# Patient Record
Sex: Male | Born: 1980 | Race: White | Hispanic: No | Marital: Married | Smoking: Former smoker
Health system: Southern US, Community
[De-identification: ages and names within clinical notes are randomized; demographics above are authoritative.]

## PROBLEM LIST (undated history)

## (undated) DIAGNOSIS — M5020 Other cervical disc displacement, unspecified cervical region: Secondary | ICD-10-CM

## (undated) DIAGNOSIS — R7301 Impaired fasting glucose: Secondary | ICD-10-CM

## (undated) DIAGNOSIS — E785 Hyperlipidemia, unspecified: Secondary | ICD-10-CM

## (undated) DIAGNOSIS — F17200 Nicotine dependence, unspecified, uncomplicated: Secondary | ICD-10-CM

## (undated) DIAGNOSIS — R030 Elevated blood-pressure reading, without diagnosis of hypertension: Secondary | ICD-10-CM

## (undated) DIAGNOSIS — J9383 Other pneumothorax: Secondary | ICD-10-CM

## (undated) DIAGNOSIS — I10 Essential (primary) hypertension: Secondary | ICD-10-CM

## (undated) HISTORY — DX: Elevated blood-pressure reading, without diagnosis of hypertension: R03.0

## (undated) HISTORY — DX: Impaired fasting glucose: R73.01

## (undated) HISTORY — DX: Nicotine dependence, unspecified, uncomplicated: F17.200

## (undated) HISTORY — DX: Essential (primary) hypertension: I10

## (undated) HISTORY — DX: Hyperlipidemia, unspecified: E78.5

## (undated) HISTORY — PX: ULNAR NERVE DECOMPRESSION: SHX7404

## (undated) HISTORY — PX: HERNIA REPAIR: SHX51

## (undated) HISTORY — PX: LUNG SURGERY: SHX703

---

## 2003-12-06 ENCOUNTER — Observation Stay (HOSPITAL_COMMUNITY): Admission: EM | Admit: 2003-12-06 | Discharge: 2003-12-07 | Payer: Self-pay | Admitting: Emergency Medicine

## 2003-12-10 ENCOUNTER — Encounter
Admission: RE | Admit: 2003-12-10 | Discharge: 2003-12-10 | Payer: Self-pay | Admitting: Thoracic Surgery (Cardiothoracic Vascular Surgery)

## 2003-12-16 ENCOUNTER — Encounter
Admission: RE | Admit: 2003-12-16 | Discharge: 2003-12-16 | Payer: Self-pay | Admitting: Thoracic Surgery (Cardiothoracic Vascular Surgery)

## 2004-09-14 ENCOUNTER — Emergency Department (HOSPITAL_COMMUNITY): Admission: EM | Admit: 2004-09-14 | Discharge: 2004-09-14 | Payer: Self-pay | Admitting: Family Medicine

## 2005-01-18 ENCOUNTER — Inpatient Hospital Stay (HOSPITAL_COMMUNITY): Admission: EM | Admit: 2005-01-18 | Discharge: 2005-01-22 | Payer: Self-pay | Admitting: Emergency Medicine

## 2005-02-11 ENCOUNTER — Encounter
Admission: RE | Admit: 2005-02-11 | Discharge: 2005-02-11 | Payer: Self-pay | Admitting: Thoracic Surgery (Cardiothoracic Vascular Surgery)

## 2005-07-23 ENCOUNTER — Emergency Department (HOSPITAL_COMMUNITY): Admission: EM | Admit: 2005-07-23 | Discharge: 2005-07-23 | Payer: Self-pay | Admitting: Emergency Medicine

## 2005-07-26 ENCOUNTER — Encounter
Admission: RE | Admit: 2005-07-26 | Discharge: 2005-07-26 | Payer: Self-pay | Admitting: Thoracic Surgery (Cardiothoracic Vascular Surgery)

## 2005-07-30 ENCOUNTER — Encounter (INDEPENDENT_AMBULATORY_CARE_PROVIDER_SITE_OTHER): Payer: Self-pay | Admitting: *Deleted

## 2005-07-30 ENCOUNTER — Inpatient Hospital Stay (HOSPITAL_COMMUNITY)
Admission: RE | Admit: 2005-07-30 | Discharge: 2005-08-03 | Payer: Self-pay | Admitting: Thoracic Surgery (Cardiothoracic Vascular Surgery)

## 2005-08-09 ENCOUNTER — Encounter
Admission: RE | Admit: 2005-08-09 | Discharge: 2005-08-09 | Payer: Self-pay | Admitting: Thoracic Surgery (Cardiothoracic Vascular Surgery)

## 2005-09-17 ENCOUNTER — Encounter
Admission: RE | Admit: 2005-09-17 | Discharge: 2005-09-17 | Payer: Self-pay | Admitting: Thoracic Surgery (Cardiothoracic Vascular Surgery)

## 2006-09-03 ENCOUNTER — Inpatient Hospital Stay (HOSPITAL_COMMUNITY): Admission: EM | Admit: 2006-09-03 | Discharge: 2006-09-11 | Payer: Self-pay | Admitting: Emergency Medicine

## 2006-09-03 ENCOUNTER — Ambulatory Visit: Payer: Self-pay | Admitting: Thoracic Surgery (Cardiothoracic Vascular Surgery)

## 2006-09-03 ENCOUNTER — Encounter (INDEPENDENT_AMBULATORY_CARE_PROVIDER_SITE_OTHER): Payer: Self-pay | Admitting: Specialist

## 2006-09-19 ENCOUNTER — Ambulatory Visit: Payer: Self-pay | Admitting: Thoracic Surgery (Cardiothoracic Vascular Surgery)

## 2006-09-19 ENCOUNTER — Encounter
Admission: RE | Admit: 2006-09-19 | Discharge: 2006-09-19 | Payer: Self-pay | Admitting: Thoracic Surgery (Cardiothoracic Vascular Surgery)

## 2006-09-20 ENCOUNTER — Encounter (INDEPENDENT_AMBULATORY_CARE_PROVIDER_SITE_OTHER): Payer: Self-pay | Admitting: *Deleted

## 2006-09-20 ENCOUNTER — Inpatient Hospital Stay (HOSPITAL_COMMUNITY)
Admission: RE | Admit: 2006-09-20 | Discharge: 2006-09-27 | Payer: Self-pay | Admitting: Thoracic Surgery (Cardiothoracic Vascular Surgery)

## 2006-10-10 ENCOUNTER — Ambulatory Visit: Payer: Self-pay | Admitting: Thoracic Surgery (Cardiothoracic Vascular Surgery)

## 2006-10-10 ENCOUNTER — Encounter
Admission: RE | Admit: 2006-10-10 | Discharge: 2006-10-10 | Payer: Self-pay | Admitting: Thoracic Surgery (Cardiothoracic Vascular Surgery)

## 2007-01-23 ENCOUNTER — Ambulatory Visit: Payer: Self-pay | Admitting: Thoracic Surgery (Cardiothoracic Vascular Surgery)

## 2007-01-23 ENCOUNTER — Encounter
Admission: RE | Admit: 2007-01-23 | Discharge: 2007-01-23 | Payer: Self-pay | Admitting: Thoracic Surgery (Cardiothoracic Vascular Surgery)

## 2007-05-11 ENCOUNTER — Emergency Department (HOSPITAL_COMMUNITY): Admission: EM | Admit: 2007-05-11 | Discharge: 2007-05-11 | Payer: Self-pay | Admitting: Emergency Medicine

## 2007-06-01 ENCOUNTER — Emergency Department (HOSPITAL_COMMUNITY): Admission: EM | Admit: 2007-06-01 | Discharge: 2007-06-01 | Payer: Self-pay | Admitting: Emergency Medicine

## 2007-07-01 ENCOUNTER — Emergency Department (HOSPITAL_COMMUNITY): Admission: EM | Admit: 2007-07-01 | Discharge: 2007-07-01 | Payer: Self-pay | Admitting: Emergency Medicine

## 2007-08-09 ENCOUNTER — Ambulatory Visit: Payer: Self-pay | Admitting: Internal Medicine

## 2007-08-09 DIAGNOSIS — R079 Chest pain, unspecified: Secondary | ICD-10-CM

## 2007-08-09 DIAGNOSIS — J939 Pneumothorax, unspecified: Secondary | ICD-10-CM | POA: Insufficient documentation

## 2007-08-09 DIAGNOSIS — J930 Spontaneous tension pneumothorax: Secondary | ICD-10-CM | POA: Insufficient documentation

## 2007-08-15 LAB — CONVERTED CEMR LAB
ALT: 47 units/L (ref 0–53)
Albumin: 4.2 g/dL (ref 3.5–5.2)
Alkaline Phosphatase: 61 units/L (ref 39–117)
BUN: 10 mg/dL (ref 6–23)
Basophils Absolute: 0 10*3/uL (ref 0.0–0.1)
Basophils Relative: 0.1 % (ref 0.0–1.0)
CO2: 26 meq/L (ref 19–32)
Calcium: 9.2 mg/dL (ref 8.4–10.5)
Creatinine, Ser: 0.9 mg/dL (ref 0.4–1.5)
MCHC: 34.2 g/dL (ref 30.0–36.0)
Monocytes Relative: 8.9 % (ref 3.0–11.0)
Platelets: 315 10*3/uL (ref 150–400)
Potassium: 4.3 meq/L (ref 3.5–5.1)
RBC: 5.07 M/uL (ref 4.22–5.81)
RDW: 12.5 % (ref 11.5–14.6)
Total Bilirubin: 1 mg/dL (ref 0.3–1.2)
Total Protein: 6.9 g/dL (ref 6.0–8.3)

## 2007-09-25 ENCOUNTER — Ambulatory Visit: Payer: Self-pay | Admitting: Thoracic Surgery (Cardiothoracic Vascular Surgery)

## 2007-10-30 ENCOUNTER — Encounter
Admission: RE | Admit: 2007-10-30 | Discharge: 2007-11-21 | Payer: Self-pay | Admitting: Physical Medicine & Rehabilitation

## 2007-11-02 ENCOUNTER — Ambulatory Visit: Payer: Self-pay | Admitting: Physical Medicine & Rehabilitation

## 2007-11-21 ENCOUNTER — Ambulatory Visit: Payer: Self-pay | Admitting: Anesthesiology

## 2007-11-21 ENCOUNTER — Ambulatory Visit: Payer: Self-pay | Admitting: Physical Medicine & Rehabilitation

## 2008-01-08 ENCOUNTER — Emergency Department (HOSPITAL_COMMUNITY): Admission: EM | Admit: 2008-01-08 | Discharge: 2008-01-08 | Payer: Self-pay | Admitting: Family Medicine

## 2008-01-26 ENCOUNTER — Encounter
Admission: RE | Admit: 2008-01-26 | Discharge: 2008-01-26 | Payer: Self-pay | Admitting: Physical Medicine & Rehabilitation

## 2008-02-12 ENCOUNTER — Ambulatory Visit: Payer: Self-pay | Admitting: Thoracic Surgery (Cardiothoracic Vascular Surgery)

## 2008-09-28 ENCOUNTER — Emergency Department (HOSPITAL_COMMUNITY): Admission: EM | Admit: 2008-09-28 | Discharge: 2008-09-28 | Payer: Self-pay | Admitting: Emergency Medicine

## 2010-07-12 ENCOUNTER — Encounter: Payer: Self-pay | Admitting: Thoracic Surgery (Cardiothoracic Vascular Surgery)

## 2010-11-03 NOTE — Assessment & Plan Note (Signed)
OFFICE VISIT   DOMINIE, BENEDICK B  DOB:  1980-07-23                                        January 23, 2007  CHART #:  [id number]   HISTORY OF PRESENT ILLNESS:  Zeke returns for followup status post re-  do right VATS for stapling of blebs and mechanical pleurodesis for  recurrent right spontaneous pneumothorax. His most recent surgery was  September 20, 2006. He was last seen here in the office on October 10, 2006.  Initially, Samual continued to improve following the surgery and had  done fairly well. He went back to work in June and was getting along,  although he continued to have some mild residual pain on the right side.  He states that a couple of weeks ago, he started having more severe pain  again, and this seemed to become exacerbated after some strenuous  activity at work. 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 Last week, he had a particularly severe episode of pain  associated with, what sounds like muscle spasms, and he returns for  further followup to our office today. He denies any shortness of breath.  He denies any fever, chills, or productive cough. He describes pain that  typically radiates around his right anterior chest and is immediately  associated with the small surgical incisions from this previous  operations and chest tube placement. The pain seems to radiate  anteriorly and is associated with some numbness in the skin as well. The  pain is relieved by Darvocet usually, although occasionally, he has had  some episodes that sound more like muscular spasm in the shoulders. The  remainder of his review of systems is unrevealing. The remainder of his  past medical history is unchanged.   PHYSICAL EXAMINATION:  GENERAL:  Notable for a thin white male who is  healthy and appears in no acute distress.  VITAL SIGNS:  Blood pressure 129/77, pulse 78, oxygen saturation 98% on  room air.  HEENT:  Examination is  unrevealing.  CHEST:  Clear breath sounds, which are symmetrical bilaterally. There  are well healed surgical scars on both sides of the chest from multiple  previous chest tubes and video thoracoscopic surgical procedures. On the  right anterior chest on palpation, Ryosuke's pain can be reproduced by  palpating immediately anterior to the intra-lateral scars from his most  recent surgery.  CARDIOVASCULAR:  Examination is unremarkable.  ABDOMEN:  Soft, nontender.  EXTREMITIES:  Warm and well perfused.  The remainder of his physical examination is unrevealing.   DIAGNOSTIC STUDIES:  Chest x-ray obtained today at the [facility] is reviewed. This demonstrates clear lung fields  bilaterally. There is no pneumothorax. No other significant  abnormalities are noted.   IMPRESSION:  Exacerbation of post-thoracotomy pain that has been  exacerbated with increased physical activity and lifting at work. Mr.  Rubenstein still has some typical symptoms consistent with his recent video  thoracoscopic procedures and associated neuropathic pain in the  intercostal nerve distribution. He also seems to get occasional episodes  of spasm that are exacerbated with specific motions and heavy lifting.   PLAN:  I have advised Treyshon to avoid any strenuous activity or heavy  lifting, pulling, or pushing that might exacerbate these symptoms. I  have reassured him that there  is no sign of recurrent pneumothorax and  it looks as though everything has healed nicely. I have give him a  prescription for Darvocet tablets, #50 total, to use as necessary for  severe pain. I have suggested that he should use non-steroidal and anti-  inflammatory medications for less severe episodes of pain. I have also  given him a prescription for Flexeril to use for occasional severe  muscle spasms, a total of #50 tablets has been dispensed. Cullan will  call and return to see [place] here as needed in the future, should  further  problems or difficulties arise. He has been advised that if he is having  severe pain or muscle spasms bad enough to require using either Darvocet  or Flexeril, he should not be driving an automobile and he certainly  should not be operating any equipment at work. All of his questions have  been addressed.   Salvatore Decent. Cornelius Moras, M.D.  Electronically Signed   CHO/MEDQ  D:  01/23/2007  T:  01/23/2007  Job:  [id number]

## 2010-11-03 NOTE — Group Therapy Note (Signed)
This is a physical medicine/rehabilitation consultation.   REASON FOR CONSULTATION:  Post thoracotomy chest pain.   HISTORY:  A 30 year old male who has had several spontaneous  pneumothoraces.  The first one was on the left side in 2005.  He was  managed conservatively without a chest tube.  Then, he had a right  spontaneous pneumothorax chest tube placed on January 18, 2005.  Then, he  had a left spontaneous pneumothorax requiring a left thoracoscopy,  stapling of blebs, pleurodesis in February 2007.  Then, he had a right  spontaneous pneumothorax, status post right thoracoscopy, VATS procedure  bleb resection, pleurodesis.  Scope was placed the 7th intercostal space  and then, September 20, 2006, VATS procedure placed through the 4th  intercostal space.  He has had some pain after each episode, after a  chest tube or thoracoscopy which slowly resolved over time.  This  current episode has been ongoing since March 2008.  He feels that it is  getting somewhat better at times.  The pain is intermittent and  difficult to predict, really not activity-related because his increased  pain might come on a couple of days after he completes something like  yard work.   His current pain is a 2/10 but when it gets bad it gets to an 8.  Described as sharp and stabbing.  He gets numbness around that area.  He  has no lower extremity weakness or numbness.  He has occasional back  spasms but really no significant back pain.  He has had no traumatic  history in terms of falls or motor vehicle accident.  His pain  interference score generally is at a 2/10 level but with relationship  with other people a 6/10.  Intercourse seems to make things worse.  His  pain is worse during the evening and nighttime hours.  Sleep is fair.  Pain improves with rest, ice, and his medications.   He has tried several medications.  His primary physician started him on  Lidoderm patch which has been helpful.  He has had some  success with  Neurontin started by primary care and increased by his cardiothoracic  surgeon.  His limiting factor had been drowsiness with the Neurontin but  he had gotten used to the 300 mg b.i.d. at this point.  It just took him  a little while to get used to it.  He has had some intermittent Darvocet  which is somewhat helpful.  Motrin does not particularly help.  He has  tried some Flexeril at night.  He has also through the ED received some  hydrocodone but not currently taking.  He has not tried any Tramadol.   PAST MEDICAL HISTORY:  Unremarkable other than the above.   CURRENT FUNCTIONAL STATUS:  Independent.  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   CURRENT MEDICATIONS:  1. Lidoderm patch on every morning and off every evening.  2. Darvocet-N 100, he takes these more on an occasional basis.  3. Gabapentin 300 mg b.i.d.   ALLERGIES:  None known.   SOCIAL HISTORY:  Single, lives with his fiance, no alcohol or drug  abuse.   FAMILY HISTORY:  Unremarkable.   VITAL SIGNS:  Blood pressure 118/69, pulse 74, respiratory rate 18, O2  sat 98% on room air.   PHYSICAL EXAMINATION:  GENERAL:  No acute distress.  BACK:  No tenderness on palpation  along the lumbar paraspinals.  MUSCULOSKELETAL:  He has incisions from prior chest tube or thoracoscopy  incisions bilateral parascapular areas and these are nontender.  He had  an area at the posterior axillary line on the right side in the lateral  rib area and this mildly painful to palpation.  LUNGS:  Clear, equal breath sounds.  HEART:  Regular rate and rhythm.  ABDOMEN:  Positive bowel sounds.  Soft, nontender.  CHEST:  He does have tenderness over the anterior ribs corresponding  with the T7 rib.  He does have some pain right at the junction of the  lower sternum.  He has some mild tenderness at the costal cartilage  below as well.  NEUROLOGIC:  Motor strength  is full bilateral upper and lower  extremities.  Range of motion is full bilateral upper and lower  extremities.  Deep tendon reflexes are normal bilateral upper and lower  extremities.  Sensation is normal over the trunk and the extremities.   IMPRESSION:  1. Post thoracotomy syndrome.  2. Intercostal neuralgia.   RECOMMENDATIONS:  1. I agree with current medications.  I would slowly increase his      Neurontin first to t.i.d. starting over a weekend when he does not      have to work and then after a week going to q.i.d.  2. I recommend intercostal nerve block.  We can do this under      fluoroscopic guidance.  We will target T6-T7 ribs.  3. Should the Neurontin not be tolerated in higher dosages, may      consider Lyrica versus Topamax.  Would also consider night-time      usage of Flexor patch.  4. Consider a TENS unit with some desensitization training through      physical therapy.   Thank you for this interesting consultation.  I will keep you apprised  of the situation.      Erick Colace, M.D.  Electronically Signed     AEK/MedQ  D:  11/02/2007 15:49:36  T:  11/02/2007 16:53:18  Job #:  [id number]   cc:   Lavonda Jumbo, M.D.  Fax: [phone]

## 2010-11-03 NOTE — Assessment & Plan Note (Signed)
OFFICE VISIT   TAVEN, STRITE B  DOB:  [birth year]                                        February 12, 2008  CHART #:  [id number]   HISTORY OF PRESENT ILLNESS:  The patient returns to the office today for  followup related to post-thoracotomy pain.  He was last seen here in the  office on September 25, 2007.  Since then, he has been followed through the  [facility], and he is doing much better now on Neurontin 400 mg 4 times  daily.  He is back to work with normal physical activity and overall, he  is getting along quite well.  He states that he will occasionally have a  day where he has a little more problem with pain and he may occasionally  take a Darvocet or use a Lidoderm patch to control this.  However, for  the most part, he is not requiring any medication other than Neurontin  and he is getting along quite well with this.  Apparently, he did have  an injection done at one point of the intercostal nerve block and  enjoyed 2 weeks of substantial improvement.  However, after 2 weeks, the  pain recurred.  Overall, he is getting along well and he has no  complaints.   The remainder of his past medical history is unchanged.   Physical exam is unchanged from previously.   IMPRESSION:  Overall, the patient is doing much better on long-term  Neurontin for post-thoracotomy pain.   PLAN:  The patient will continue to follow up in the Pain Clinic.  He  will call or return to see [place] as needed in the future.   Salvatore Decent. Cornelius Moras, M.D.  Electronically Signed   CHO/MEDQ  D:  02/12/2008  T:  02/13/2008  Job:  [id number]

## 2010-11-03 NOTE — Assessment & Plan Note (Signed)
OFFICE VISIT   ZENO, HICKEL B  DOB:  10/18/1980                                        September 25, 2007  CHART #:  [id number]   HISTORY OF PRESENT ILLNESS:  Mr. Charnley returns for followup status post  bilateral VATS for apical stapling of blebs with mechanical pleurodesis  for recurrent spontaneous pneumothoraces. His most recent surgery was  redo right VATS on September 20, 2006. He was last seen here in the office on  January 23, 2007. Wolf had problems with postoperative pain management  following all of his previous surgical procedures. This seemed to be  most pronounced after this most recent surgery, which was a redo  operation on the right side. His pain had gradually subsided, and he was  back at work. But apparently in December the pain got worse again and  has become more troublesome. He has been followed by his primary care  physician, Dr. Devoria Albe. He has been tried on Lidoderm with some  success as well as low-dose Neurontin and ibuprofen. He apparently was  seen by Dr. Sandrea Hughs and underwent a chest CT scan in February 2009.  This revealed no acute findings whatsoever and was notable only for some  mild residual scarring in both upper lobes related to the patient's  previous surgery. Mr. Bogdanski has now been referred back to our office  for evaluation of continued pain.   REVIEW OF SYSTEMS:  Levan notes that his pain is always in the same  location, and it is located along the costochondral margin involving the  anterior aspect of the right 4th, 5th, and 6th ribs and their costal  cartilages. He reports problems with swelling in this area that is  associated with increased numbness and hyperesthesia and pain. He has  been told by others that he has had costochondritis as well as possible  gas in this stomach. He denies any fevers, chills, or productive cough.  At times, the pain is exacerbated by some physical activities,  particularly when  he is lifting things at work. The remainder of his  review of systems is unremarkable. The remainder of his past medical  history is unchanged.   PHYSICAL EXAMINATION:  GENERAL:  Well-appearing, thin, young male.  VITAL SIGNS:  Blood pressure 139/84, pulse 88, oxygen saturation 96% on  room air.  HEENT:  Unrevealing.  NECK:  Supple. There is no lymphadenopathy.  CHEST:  Auscultation of the chest reveals clear breath sounds which are  symmetrical bilaterally. All of the small surgical incisions from chest  tube placements and video-assisted thoracoscopic surgery have healed  completely. There are no bony abnormalities that are palpable on the  thoracic rib cage. There is no swelling at all appreciated anteriorly  where Carla reports feeling sensation of swelling.  ABDOMEN:  Soft, nontender. No other abnormalities are noted.   IMPRESSION:  Likely post-thoracotomy pain syndrome with continued  neuropathic pain in the nerve root distribution associated with  intercostal nerves, particularly in the right 5th intercostal space.   PLAN:  I have advised Izrael to resume Lidoderm patch, ibuprofen, and  try an increased dose of Neurontin. He is concerned that the Neurontin  may make him drowsy, so for the time being I have suggested trying 600  mg at bedtime. This could be given during the daytime  if it does not  make him drowsy or if his pain is debilitating to the point where he is  not going to work. It does not seem to be quite that bad at this point.  I have advised him that he might benefit from a referral to the pain and  rehab center here in [facility] for further evaluation and management of  this particularly difficult case of post-thoracotomy neuropathic pain.  All of his questions have been addressed. He will return in 3 months for  further followup.   Salvatore Decent. Cornelius Moras, M.D.  Electronically Signed   CHO/MEDQ  D:  09/25/2007  T:  09/25/2007  Job:  [id number]   cc:   Devoria Albe,  M.D.  Charlaine Dalton. Sherene Sires, MD, FCCP

## 2010-11-06 NOTE — Op Note (Signed)
Cristian Walker, Cristian Walker              ACCOUNT NO.:  [id number]   MEDICAL RECORD NO.:  [id number]          PATIENT TYPE:  INP   LOCATION:  2550                         FACILITY:  [facility]   PHYSICIAN:  Salvatore Decent. Cornelius Moras, M.D. DATE OF BIRTH:  Aug 02, 1980   DATE OF PROCEDURE:  07/30/2005  DATE OF DISCHARGE:                                 OPERATIVE REPORT   PREOPERATIVE DIAGNOSIS:  Recurrent left spontaneous pneumothorax.   POSTOPERATIVE DIAGNOSIS:  Recurrent left spontaneous pneumothorax.   PROCEDURE:  Left video video-assisted thoracoscopy for stapling of apical  blebs and mechanical pleural adhesions.   SURGEON:  Salvatore Decent. Cornelius Moras, M.D.   ASSISTANT:  Jerold Coombe, P.A.   ANESTHESIA:  General.   BRIEF CLINICAL NOTE:  The patient is a 30 year old male with previous  history of spontaneous pneumothorax.  He has sustained a left spontaneous  pneumothorax on two previous occasions and right spontaneous pneumothorax on  one.  Recently he returned with a recurrent left spontaneous pneumothorax.  The indications and potential benefits of video-assisted thoracoscopy for  apical stapling of blebs and pleurodesis has been discussed at length with  the patient.  Alternative treatment strategies have been discussed.  He  understands and accepts all associated risks of surgery including but not  limited to risks of death, myocardial infarction, pneumonia, respiratory  failure, prolonged air leak requiring chest tube drainage, prolonged chest  wall pain, recurrent spontaneous pneumothorax.  All of his questions have  been addressed.   OPERATIVE NOTE IN DETAIL:  The patient is brought to the operating room and  above-mentioned date and placed in the supine position on the operating  table.  A central venous catheter and radial arterial line are placed.  Intravenous antibiotics are administered. Pneumatic sequential compression  boots are placed on both lower extremities.  General endotracheal  anesthesia  is induced uneventfully under the care and direction of Dr. Sharee Holster  with a dual-lumen endotracheal tube.  A Foley catheter is placed.  After  verifying appropriate position of the dual-lumen endotracheal tube, the  patient is turned to the right lateral decubitus position using an axillary  roll and pneumatic beanbag device to facilitate positioning.  The patient's  left chest is prepared and draped in a sterile manner.   Single-lung ventilation is begun.  A small incision is made overlying the  anterior axillary line at approximately the sixth intercostal space.  The  intercostal musculature and subcutaneous tissues are divided with  electrocautery.  The left pleural space is entered bluntly.  There do not  appear to be any adhesions.  A 10 mm port is passed through the incision and  a 30-degree endoscopic video scope is passed through the port.  The left  chest is explored visually.  The lung is completely collapsed without any  adhesions.  Two additional port incisions are placed including one located  posteriorly overlying the fifth intercostal space and another located  anteriorly over the fourth intercostal space.  Instruments are passed  through each of these two port incisions to facilitate subsequent surgical  procedure.  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  Wedge resection of the apical blebs from the apex of the left upper lobe is  performed using several fires of the Echelon endoscopic stapling device with  3.5 mm staple load.  Another bleb is located laterally in the left upper  lobe, and this is resected as a separate small wedge specimen.  Each of  these two specimens are sent to pathology for permanent histology.  The  remainder of the left upper lobe is inspected and no other significant blebs  are identified.  The superior segment of left lower lobe is  inspected, and  there do not appear to be any significant blebs in this region.  Apical  pleurectomy is now performed.  The parietal pleura is removed from the  majority of the apex of the entire left hemithorax.  A cautery scratchpad is  then utilized to abrade the remainder of the parietal pleural laterally,  posteriorly, as well as along the dome of the diaphragm.  Adequate  hemostasis is achieved.   The left pleural space is drained using a single 28-French right-angled  chest tube exited through the seventh intercostal port incision.  The On-Q  continuous pain management system is utilized to assist postoperative pain  control.  A single five-inch Silastic catheter from the On-Q kit is tunneled  through the subcutaneous tissues and then subsequently tunneled into the  intercostal musculature between the fifth and sixth rib.  No attempt is made  to place the catheter posteriorly vertically as the parietal pleura has been  removed in this region.  The catheter is flushed with 5 mL of 0.5%  bupivacaine and ultimately connected to a continuous infusion pump.  The  lung is now ventilated with the endoscopic camera through the anterior port  incision.  There is no obvious sign of any significant air leaks and the  lung re-expanded nicely.  The camera and port are removed.  The two  remaining port incisions are closed in multiple layers and dry sterile  dressings are applied.  The chest tube is fixed to a closed-suction drainage  device.   The patient tolerated the procedure well and is extubated in the operating  room, transported to the recovery room in stable condition.  There are no  intraoperative complications.  All sponge, instrument and needle counts are  verified correct.  Salvatore Decent. Cornelius Moras, M.D.  Electronically Signed     CHO/MEDQ  D:  07/30/2005  T:  07/30/2005  Job:  [id number]

## 2010-11-06 NOTE — H&P (Signed)
NAMESAMAR, DASS              ACCOUNT NO.:  [id number]   MEDICAL RECORD NO.:  [id number]          PATIENT TYPE:  INP   LOCATION:  1824                         FACILITY:  [facility]   PHYSICIAN:  Salvatore Decent. Cornelius Moras, M.D. DATE OF BIRTH:  01-16-1981   DATE OF ADMISSION:  01/18/2005  DATE OF DISCHARGE:                                HISTORY & PHYSICAL   PRESENTING CHIEF COMPLAINT:  Right-sided chest pain.   HISTORY OF PRESENT ILLNESS:  Patient is a 30 year old previously healthy  male with history of small left spontaneous pneumothorax in June2005 treated  conservatively.  This morning he developed sudden onset of right-sided chest  pain associated with shortness of breath.  This pain was similar to symptoms  he suffered one year ago, but much more severe.  He presented to the  emergency room where chest x-ray demonstrates large right pneumothorax.   REVIEW OF SYSTEMS:  The patient reports feeling well otherwise recently.  He  denied productive cough or shortness of breath.  He denies recent fevers or  chills.  He denies any recent history of trauma.  The remainder of his  review of systems is noncontributory.   PAST MEDICAL HISTORY:  1.  Small left spontaneous pneumothorax June2005 treated conservatively      without need for chest tube placement  2.  Episode of tracheobronchitis in [id number].   PAST SURGICAL HISTORY:  None.   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  This  does require some heavy lifting and strenuous activity at times.  The  patient is a smoker.  He denies excessive alcohol consumption.   MEDICATIONS PRIOR TO ADMISSION:  None.   ALLERGIES:  None known.   PHYSICAL EXAMINATION:  GENERAL:  Patient is a thin, well-appearing white  male who appears his stated age.  The patient is short of breath, but  otherwise in no distress.  He is in sinus rhythm.  VITAL  SIGNS:  Oxygen saturation is greater than 90% on 2 L nasal cannula.  HEENT:  Grossly unrevealing.  The trachea is midline.  NECK:  Supple.  There is no cervical or supraclavicular lymphadenopathy.  LUNGS:  Auscultation of the chest reveals diminished breath sounds in the  right lung field.  No wheezes or rhonchi are noted.  CARDIOVASCULAR:  Regular rate and rhythm.  No murmurs or rubs are noted.  ABDOMEN:  Soft, nontender.  EXTREMITIES:  Warm and well-perfused.  There is no lower extremity edema.  Pulses are intact bilaterally.  The remainder of his physical examination is  noncontributory.   DIAGNOSTIC TESTS:  Chest x-ray demonstrates large right pneumothorax.   IMPRESSION:  Right spontaneous pneumothorax.   PLAN:  Chest tube placement followed by hospital admission.       CHO/MEDQ  D:  01/18/2005  T:  01/18/2005  Job:  [id number]

## 2010-11-06 NOTE — H&P (Signed)
NAMEJEDRICK, HUTCHERSON              ACCOUNT NO.:  [id number]   MEDICAL RECORD NO.:  [id number]          PATIENT TYPE:  EMS   LOCATION:  MAJO                         FACILITY:  [facility]   PHYSICIAN:  Salvatore Decent. Cornelius Moras, M.D. DATE OF BIRTH:  04-Oct-1980   DATE OF ADMISSION:  09/03/2006  DATE OF DISCHARGE:                              HISTORY & PHYSICAL   CHIEF COMPLAINT:  Right side chest pain.   HISTORY OF PRESENT ILLNESS:  Mr. Cristian Walker is a 30 year old white male with  history of multiple recurrent spontaneous pneumothoraces.  He sustained  his first spontaneous pneumothorax on the left side in June 2005.  In  July 2006 he sustained a large right spontaneous pneumothorax which was  treated with chest tube placement.  In February 2007 the patient  developed a recurrent left spontaneous pneumothorax and underwent left  VATS for apical bleb resection and pleurodesis on July 30, 2005.  He  recovered from this uneventfully.  He quit smoking.  He was in his usual  state of health until this morning when he developed sudden onset of  right-sided chest pain and mild shortness of breath.  He presented to  the emergency room where a large right pneumothorax was identified on  routine chest x-ray.   REVIEW OF SYSTEMS:  Unremarkable.  The patient has been in his usual  state of health.  He denies any recent problems with shortness of breath  or cough.  He has not been smoking.  He does have some history of  anxiety and he reports that he has actually been dreaming about the idea  that he might need surgery on the right side.  The remainder of his  review of systems is entirely unremarkable.   PAST MEDICAL HISTORY:  1. Left spontaneous pneumothorax, June 2005.  2. Right spontaneous pneumothorax, July 2006.  3. Recurrent left spontaneous pneumothorax, February 2007.  4. Tracheobronchitis, March 2006.  5. Remote history of tobacco use.   PAST SURGICAL HISTORY:  1. Right chest tube placement, July  2006.  2. Left VATS for apical bleb resection and pleurodesis, February 2007.   FAMILY HISTORY:  Noncontributory.   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  This does require some heavy lifting and strenuous physical activity.  The patient quit smoking.  He denies excessive alcohol consumption.   MEDICATIONS PRIOR TO ADMISSION:  None.   DRUG ALLERGIES:  None known.   PHYSICAL EXAMINATION:  The patient is a thin white male who appears his  stated age in no acute distress.  He is breathing comfortably on 2  liters nasal cannula.  HEENT: Exam is unrevealing.  NECK:  The neck is supple.  The trachea is midline.  Auscultation of the chest demonstrates diminished breath sounds on the  right side.  No wheezes or rhonchi are noted.  CARDIOVASCULAR:  Exam  includes regular rate and rhythm.  No murmurs, rubs, or gallops are  noted.  ABDOMEN:  Soft, nontender.  EXTREMITIES:  Warm and well-perfused.  The remainder of his physical  exam is noncontributory.   DIAGNOSTIC TESTS:  CHEST X-RAY:  Obtained in the emergency room  demonstrates large right pneumothorax.   IMPRESSION:  Recurrent right spontaneous pneumothorax.   PLAN:  I have discussed options at length with Mr. Buckles here in the  emergency room.  He is well aware of the circumstances given his  previous history.  He desires to proceed directly to surgical  intervention for right VATS with apical bleb resection and pleurodesis.  He understands and accepts all associated risks of surgery including but  not limited to risks of bleeding, prolonged pain, prolonged air leak  requiring chest tube drainage, recurrent pneumothorax, pneumonia.  All  of his questions have been addressed.      Salvatore Decent. Cornelius Moras, M.D.  Electronically Signed     CHO/MEDQ  D:  09/03/2006  T:  09/04/2006  Job:  [id number]

## 2010-11-06 NOTE — H&P (Signed)
Cristian Walker, Cristian Walker              ACCOUNT NO.:  [id number]   MEDICAL RECORD NO.:  [id number]           PATIENT TYPE:   LOCATION:                                 FACILITY:   PHYSICIAN:  Salvatore Decent. Cornelius Moras, M.D.      DATE OF BIRTH:   DATE OF ADMISSION:  09/20/2006  DATE OF DISCHARGE:                              HISTORY & PHYSICAL   PRESENTING CHIEF COMPLAINT:  Right-sided chest pain and shortness of  breath.   HISTORY OF PRESENT ILLNESS:  Cristian Walker is a 30 year old male who  recently was admitted on March15,2008, for recurrent right  spontaneous pneumothorax.  He was taken to the operating room that day  where he underwent right VATS for stapling of blebs and mechanical  pleurodesis.  He initially did well.  However, after his chest tube was  removed several days later, his right lung partially collapsed, and a  second chest tube had to be replaced.  After several further days of  observation, this chest tube was ultimately removed.  He was discharged  home just over a week ago.  He returns for the office today complaining  of increased right-sided pain and shortness of breath.  Chest x-ray  demonstrates interval increase in size of right-sided pneumothorax, now  approximately 25%.   REVIEW OF SYSTEMS:  Unremarkable.  The patient is otherwise well and  healthy.   PAST MEDICAL HISTORY:  1. Left spontaneous pneumothorax June2005.  2. Right spontaneous pneumothorax July2006.  3. Recurrent left spontaneous pneumothorax February2007.  4. Tracheobronchitis [id number].  5. Remote history tobacco use.  6. Recurrent right spontaneous pneumothorax September 03, 2006  7. Recurrent right spontaneous pneumothorax September 06, 2006   PAST SURGICAL HISTORY:  1. Right chest tube placement [id number].  2. Left VATS for apical bleb resection and pleurodesis      February2007.  3. Right VATS for apical bleb resection and pleurodesis      March15,2008.  4. Right chest tube placement September 06, 2006.   FAMILY HISTORY:  Noncontributory.   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  He  quit smoking.   MEDICATIONS PRIOR TO ADMISSION:  None.   DRUG ALLERGIES:  None known.   PHYSICAL EXAMINATION:  GENERAL:  The patient is a thin white male in no  acute distress.  VITAL SIGNS: Oxygen saturation 99% on room air, respirations 18 and  unlabored, blood pressure 110/70, pulse 77.  HEENT: Exam is within normal limits.  CHEST:  Auscultation of the chest reveals slightly diminished breath  sounds on the right side.  All the previous surgical incisions are  healing nicely.  CARDIOVASCULAR:  Exam is notable for regular rate and rhythm.  No  murmurs, rubs or gallops are noted.  ABDOMEN:  Soft, nontender.  EXTREMITIES:  Warm and well-perfused.   The remainder of his physical exam is noncontributory.   DIAGNOSTIC TESTS:  Chest x-ray obtained today is reviewed and compared  with the last previous exam for March23.  This demonstrates  increased  size of right apical pneumothorax, now approximately 25%.  No other  abnormalities are noted.   IMPRESSION:  Second time recurrent partial collapse of the right lung  following recent right video-assisted thoracoscopy with apical bleb  resection and pleurodesis.   PLAN:  I have discussed options at length with Cristian Walker including  continued observation, repeat chest tube placement, or proceeding  directly to surgery for redo VATS and possible thoracotomy.  I suspect  that surgical intervention would be the best approach given the fact  that this is the second time Cristian Walker's lung has partially collapsed  following his recent surgery.  I have discussed the indications, risks,  and potential benefits of surgery with Cristian Walker.  All of his questions  have been addressed.  We tentatively plan to proceed with surgery first  thing tomorrow morning.   During the interval period of time, Cristian Walker  will have someone bring him to the emergency department should he  develop any increased shortness of breath.      Salvatore Decent. Cornelius Moras, M.D.  Electronically Signed     CHO/MEDQ  D:  09/19/2006  T:  09/19/2006  Job:  [id number]

## 2010-11-06 NOTE — Op Note (Signed)
Cristian Walker, Cristian Walker              ACCOUNT NO.:  [id number]   MEDICAL RECORD NO.:  [id number]          PATIENT TYPE:  INP   LOCATION:  1824                         FACILITY:  [facility]   PHYSICIAN:  Salvatore Decent. Cornelius Moras, M.D. DATE OF BIRTH:  06/30/1980   DATE OF PROCEDURE:  01/18/2005  DATE OF DISCHARGE:                                 OPERATIVE REPORT   PREOPERATIVE DIAGNOSIS:  Right spontaneous pneumothorax.   POSTOPERATIVE DIAGNOSIS:  Right spontaneous pneumothorax.   PROCEDURE:  Right chest tube placement.   SURGEON:  Salvatore Decent. Cornelius Moras, M.D.   ANESTHESIA:  Lidocaine 1% with intravenous sedation.   OPERATIVE NOTE:  Following full informed consent with the patient in the  emergency department, light intravenous sedation was administered. The  patient's right chest was prepared with Betadine solution and draped in a  sterile manner. Lidocaine solution 1% was utilized to locally anesthetize  the skin and subcutaneous tissues overlying the fifth intercostal space in  approximately the mid axillary line. A small incision is made with a #11  blade knife. Through this incision, additional lidocaine solution is  administered to facilitate intercostal nerve block. A 28-French chest tube  was passed through the incision and directed into the right pleural space.  The chest tube is secured to the skin with silk sutures and connected to a  closed suction drainage device. The patient tolerated the procedure well.  There were no complications. Follow-up chest x-ray remains pending.       CHO/MEDQ  D:  01/18/2005  T:  01/18/2005  Job:  [id number]

## 2010-11-06 NOTE — Op Note (Signed)
Cristian Walker, Cristian Walker              ACCOUNT NO.:  [id number]   MEDICAL RECORD NO.:  [id number]          PATIENT TYPE:  INP   LOCATION:  1825                         FACILITY:  [facility]   PHYSICIAN:  Salvatore Decent. Cornelius Moras, M.D. DATE OF BIRTH:  [birth year]   DATE OF PROCEDURE:  09/03/2006  DATE OF DISCHARGE:                               OPERATIVE REPORT   PREOPERATIVE DIAGNOSIS:  Recurrent right spontaneous pneumothorax.   POSTOPERATIVE DIAGNOSIS:  Recurrent right spontaneous pneumothorax.   PROCEDURE:  Right video assisted thoracoscopy for apical bleb resection  and mechanical pleurodesis.   SURGEON:  Salvatore Decent. Cornelius Moras, M.D.   ASSISTANT:  Zadie Rhine, P.A.-C.   ANESTHESIA:  General.   BRIEF CLINICAL NOTE:  The patient is a 30 year old male with previous  history of right spontaneous pneumothorax in 2006 treated with chest  tube placement.  The patient has also undergone previous left video  assisted thoracoscopy for bleb resection and pleurodesis for recurrent  left spontaneous pneumothorax in the past.  The patient now presents  with a recurrent right spontaneous pneumothorax.  After full informed  consent, the patient is brought directly to the operating room for  definitive surgical management.  The patient understands and accepts all  associated risks of surgery and desires to proceed as described.   OPERATIVE NOTE IN DETAIL:  The patient is brought directly from the  emergency room to the operating room on the afternoon of September 03, 2006.  Central venous catheter is placed for intravenous access.  Intravenous  antibiotics were administered.  The patient is placed in the supine  position on the operating table.  Pneumatic sequential compression boots  were placed on both lower extremities.  General endotracheal anesthesia  is induced uneventfully and a dual lumen endotracheal tube is placed.  The patient is turned to the left lateral decubitus position using a  pneumatic  beanbag device and axillary roll to facilitate positioning.  The patient's right chest was prepared and draped in a sterile manner.   A small incision is made over the anterior axillary line in  approximately the seventh intercostal space.  The intercostal  musculature and subcutaneous tissues were divided with electrocautery.  The right pleural space was entered bluntly.  A 10 mm port is passed  through the incision and an endoscopic camera is passed through the  port.  A routine video exploration is performed.  The lung was  completely collapsed.  There are no adhesions.  There is some obvious  blebs at the apex of the right lung.  Two additional port incisions were  placed including one anteriorly and one posteriorly.  Using these port  incisions, the lung was grasped and elevated and the apex of the right  lung was visualized.  There are numerous blebs in the apex of the right  upper lung.  A generous wedge resection of the apex of the right lung is  performed using several fires of the echelon endoscopic GIA stapling  device.  The apex was sent for routine pathology.  Further exploration  is performed.  No blebs are noted in the superior  segment of the right  lower lobe.  The remainder of the lung appears fairly normal.  No other  abnormalities are noted.  The parietal pleura is stripped from the  anterior lateral and apical wall of the chest.  Meticulous hemostasis is  ascertained.  The On-Q continuous pain management system is utilized to  facilitate postoperative pain control.  A 5 inch catheters supplied with  the On-Q kit is tunneled through a separate stab incision posteriorly  and positioned in the posterior gutter.  This is positioned in the  subpleural space just below the level of the pleurectomy.  The catheter  is flushed with 5 mL of 0.5% bupivacaine solution and ultimately  connected to continuous infusion pump.  The right chest is drained with  a single 28-French chest  tube.  The remaining incisions were closed in  multiple layers with running absorbable suture.  The patient tolerated  the procedure well, is extubated in the operating room, and transported  to the recovery room in stable condition.  There are no intraoperative  complications.  All sponge, instrument and needle counts were verified  as correct at the completion of the procedure.      Salvatore Decent. Cornelius Moras, M.D.  Electronically Signed     CHO/MEDQ  D:  09/03/2006  T:  09/04/2006  Job:  [id number]

## 2010-11-06 NOTE — Discharge Summary (Signed)
NAMEELIZAR, Cristian Walker              ACCOUNT NO.:  [id number]   MEDICAL RECORD NO.:  [id number]          PATIENT TYPE:  INP   LOCATION:  2034                         FACILITY:  [facility]   PHYSICIAN:  Salvatore Decent. Cornelius Moras, M.D. DATE OF BIRTH:  09-05-1980   DATE OF ADMISSION:  09/20/2006  DATE OF DISCHARGE:                               DISCHARGE SUMMARY   ADMISSION DIAGNOSIS:  Recurrent right spontaneous pneumothorax.   DISCHARGE/SECONDARY DIAGNOSES:  1. Recurrent right spontaneous pneumothorax status post redo right      video assisted thoracoscopy for apical stapling of blebs of      mechanical pleurodesis.  2. History of left spontaneous pneumothorax in June of 2005.  Right      spontaneous pneumothorax in July of 2006.  Recurrent left      spontaneous pneumothorax in February of 2007.  Recurrent right      spontaneous pneumothorax on September 03, 2006.  Recurrent right      spontaneous pneumothorax on September 06, 2006.  3. Tracheobronchitis in March of 2006.  4. Remote history of tobacco use.  5. Right chest tube placement in July of 2006.  6. Left video assisted thoracoscopy for apical bleb resection and      pleurodesis in February of 2007.  7. Right video assisted thoracoscopy for apical bleb resection and      pleurodesis in March of 2008.  8. Right chest tube placement on September 06, 2006.   PROCEDURES:  Redo right video assisted thoracoscopy for apical stapling  and blebs and mechanical pleurodesis.  Surgeon - Dr. Tressie Stalker.   BRIEF HISTORY:  Cristian Walker is a 30 year old male who was recently  admitted on September 03, 2006 for recurrent right spontaneous pneumothorax.  He was taken to the operating room that day where he underwent right  VATS for stapling of blebs and mechanical pleurodesis.  He initially did  well.  However, after his chest tube was removed, several days later his  right lung partially collapsed and second chest tube had to be replaced.  After several days of  observation, his chest tube was ultimately  removed, and he was discharged home.  On September 20, 2006, he returned to  the office for follow-up complaining of increased right-sided pain and  shortness of breath.  Chest x-ray demonstrated interval increase in size  of the right-sided pneumothorax now at approximately 25%.  Dr. Cornelius Moras  discussed several options with Cristian Walker including continued  observation, repeat chest tube placement, or proceeding directly to  surgery for a redo VATS and possible thoracotomy.  Since this was the  second time of recurrence, surgical intervention was felt to be the best  approach.  The patient agreed to proceed.   HOSPITAL COURSE:  Cristian Walker was admitted to [facility] on  September 20, 2006 for recurrent spontaneous right pneumothorax.  He was  taken to the operating room for bleb stapling and mechanical  pleurodesis.  Postoperatively, he was extubated, neurologically intact  and transferred to step-down unit 3300 where he remained for the  majority of his hospitalization.  Pain was  initially controlled with  Dilaudid PCA.  For the first third 3 days postoperatively, the patient  had a small air leak with a chest x-ray showing 10 to 15% right  pneumothorax which was managed with suction.  The air leak seemed to  decrease over time, and his posterior chest tube was ultimately  discontinued on postoperative day #3, and his remaining tube was placed  to water seal.  Following a few days of water seal, his chest x-ray  remained stable showing only approximately 5% apical pneumothorax on the  right.  By September 26, 2006, his air leak had resolved.  Subsequently, his  chest tube was discontinued.  His follow-up chest x-ray showed possible  recurrence of his right pneumothorax, but there was also a question if  the appearance was actually due to pleural reaction.  The chest x-ray  was felt stable by Dr. Cornelius Moras, and no further tests were ordered at the  time other  than a follow-up chest x-ray, PA and lateral, the next  morning.  If his chest x-ray remains stable, then we anticipate him  being discharged home on postoperative day #7, September 27, 2006.   Following removal of his chest tube, his Dilaudid PCA was discontinued.  Afterwards, his pain was managed on OxyContin 10 mg SR p.r.n.  He did  not like how the Percocet made him feel, so he was changed to Darvocet  p.r.n., as well.  Throughout his hospitalization, Cristian Walker remained  stable.  Oxygen saturations were above 92% on room air.  The most recent  labs showed a white blood count of 9.2, hemoglobin 12.2, hematocrit  34.8, platelet count 312.  Sodium 138, potassium 3.6, chloride 103, CO2  of 31, BUN 6, creatinine 0.67, blood glucose 91.   PHYSICAL EXAMINATION:  HEART:  Regular rate and rhythm.  LUNGS:  Lung sounds were clear.  SKIN:  Incisions were healing well without signs of infection.   DISPOSITION:  If Cristian Walker's follow-up chest x-ray on September 27, 2006 is  stable, we anticipate he will be discharged home in stable condition.   DISCHARGE MEDICATIONS:  1. OxyContin 10 mg SR one tablet p.o. q.12h. p.r.n. pain.  2. Darvocet-N 100, 1-2 tablets p.o. q.4-6h. p.r.n. pain.   DISCHARGE INSTRUCTIONS:  He can follow a regular diet.  Increase his  activity slowly and avoid driving or heavy lifting for the next week.  He may shower and clean his incisions gently with soap and water and  should call if he develops fever greater than 101, redness or drainage  from his incision sites or increased shortness of breath.  He will  follow up with Dr. Cornelius Moras in approximately 7-10 days with a chest x-ray 1  hour before at [facility].      Jerold Coombe, P.A.      Salvatore Decent. Cornelius Moras, M.D.  Electronically Signed    AWZ/MEDQ  D:  09/26/2006  T:  09/26/2006  Job:  [id number]   cc:   Dr. Lynelle Doctor

## 2010-11-06 NOTE — Discharge Summary (Signed)
NAMEDORSIE, BURICH              ACCOUNT NO.:  [id number]   MEDICAL RECORD NO.:  [id number]          PATIENT TYPE:  INP   LOCATION:  2037                         FACILITY:  [facility]   PHYSICIAN:  Pecola Leisure, PA   DATE OF BIRTH:  11-04-1980   DATE OF ADMISSION:  01/18/2005  DATE OF DISCHARGE:                                 DISCHARGE SUMMARY   ADMISSION DIAGNOSIS:  Right-sided chest pain.   PAST MEDICAL HISTORY AND DISCHARGE DIAGNOSES:  1.  Small left spontaneous pneumothorax, June 2005, treated conservatively      without need for chest tube placement.  2.  Episode of tracheobronchitis in March 2006.  3.  Right spontaneous pneumothorax treated with chest tube and resolved.   ALLERGIES:  No known drug allergies.   BRIEF HISTORY:  The patient is a 30 year old Caucasian male who was  previously quite healthy with a history of  a small left spontaneous  pneumothorax in June 2005, which was treated conservatively.  On the morning  of January 18, 2005, he developed sudden onset of right-sided chest pain  associated with shortness of breath.  His pain was similar to symptoms he  had suffered previously but much more severe.  He then presented to the  emergency room, where a chest x-ray demonstrated a large right pneumothorax.  Dr. Tressie Stalker of CVTS service was consulted regarding chest tube  placement and resolution of pneumothorax.   HOSPITAL COURSE:  The patient was admitted via the emergency room with a  spontaneous large right pneumothorax as previously stated.  Dr. Cornelius Moras placed  a right chest tube without complications.  At that time the patient was  admitted for observation.   The patient's hospital course has progressed without complication and as  expected.  The chest tube remained in place for three days.  All vital signs  remained stable, and he remained afebrile throughout the hospital course.  O2 saturations also remained stable on room air throughout the hospital  course.   Minimal drainage from the chest tube throughout the time of insertion.  He  initially had a small intermittent 1/7 air leak.  The chest tube was kept to  suction, and this resolved.  It was subsequently switched to water seal.  He  has remained stable with no further air leak, and the chest tube was  discontinued in a routine manner on January 21, 2005, again without  complications.  Portable chest x-ray after removal was stable.  A repeat PA  and lateral chest x-ray will be taken in the morning of January 22, 2005.  As  long as the chest x-ray and the patient's vital signs remain stable, he will  be ready for discharge within the next one to two days pending morning round  reevaluation.   CONDITION ON DISCHARGE:  Stable.   INSTRUCTIONS:   DIET:  No restriction.   ACTIVITY:  The patient should increase his activity as tolerated, and he may  shower daily.  The patient should refrain from driving for one week while  taking narcotic pain medications, and no heavy lifting for three weeks.  WOUND CARE:  The patient may clean site daily with soap and water and keep  the incisions clean and dry.  If wound problems arise, he should contact the  CVTS office at [phone].   MEDICATIONS:  1.  Over-the-counter nicotine patch, and the patient will be instructed to      follow package instructions for wean.  2.  Tylox one to two q.4-6h. p.r.n. pain.   FOLLOW-UP APPOINTMENT:  With Dr. Cornelius Moras on February 08, 2005, at 11:45 a.m.  The  patient should present to [facility] for a PA and lateral chest  x-ray on February 08, 2005, at 10:45 a.m.       AY/MEDQ  D:  01/21/2005  T:  01/22/2005  Job:  [id number]

## 2010-11-06 NOTE — Discharge Summary (Signed)
Cristian Walker, Cristian Walker              ACCOUNT NO.:  [id number]   MEDICAL RECORD NO.:  [id number]          PATIENT TYPE:  INP   LOCATION:  2034                         FACILITY:  [facility]   PHYSICIAN:  Salvatore Decent. Cornelius Moras, M.D. DATE OF BIRTH:  [birth year]   DATE OF ADMISSION:  07/30/2005  DATE OF DISCHARGE:  08/03/2005                                 DISCHARGE SUMMARY   ADMISSION DIAGNOSIS:  Recurrent left spontaneous pneumothorax.   DISCHARGE DIAGNOSES:  1.  Recurrent left spontaneous pneumothorax status post left video-assisted      thoracic surgery for stapling of apical blebs.  2.  History of left spontaneous pneumothorax in June 2005.  3.  History of right spontaneous pneumothorax in July 2006.  4.  Tracheobronchitis in March 2006.  5.  History of tobacco abuse.   CONSULTS:  None.   PROCEDURES:  On July 30, 2005, the patient underwent a left video-  assisted thoracic surgery for stapling of apical blebs and mechanical  pleurodesis by Dr. Tressie Stalker.   HISTORY AND PHYSICAL EXAMINATION:  Cristian Walker is a 30 year old white male  with a known history of previous spontaneous pneumothorax, who presented  with sudden onset left-sided chest pain 3 days ago and was found to have  recurrent left spontaneous pneumothorax.  Cristian Walker originally presented in  June of 2005 with a small left spontaneous pneumothorax.  This was treated  conservatively and did not require chest tube placement or surgery.  Symptoms resolved eventually without intervention.  In July 2006, he  returned with a large right pneumothorax.  This did require a chest tube  placement, but the pneumothorax was successfully treated, and he again has  done well until recently.  Three days ago he developed similar left-sided  chest pain consistent with similar symptoms he had experienced in the past.  He was evaluated by the Emergency Room physicians at [facility] and was  found to have a small 10 to 15% left pneumothorax.   He was given some pain  medications and set up to return to see [place] in the office today with a repeat  chest x-ray due to uncertain stability.  For the last 2 days, Cristian Walker  reports he continues to have left-sided chest pain, which seems to be  exacerbated by movement and coughing.  He denied any shortness of breath and  otherwise had no other complaints.   HOSPITAL COURSE:  The patient had an uneventful hospital course without any  complications.  The day of surgery, the patient was awake and alert.  His  chest was clear to auscultation.  His chest tube showed no air leak and a  total of about 300 mL out with a stable chest x-ray.  Postop day #1, the  patient's vital signs were stable.  His chest x-ray showed a resolution of  left pneumothorax and persistent atelectasis.  The patient started to  ambulate and was using his incentive spirometry.  His chest tube again  showed no air leak.  His suction was decreased to 10 cm and on August 01, 2005 a chest tube was  placed to water seal.  The patient's chest tube was  removed on August 02, 2005.  His chest x-rays remained stable.  The  patient was transferred to [facility] on August 02, 2005 and will be discharged  home in a.m. if his chest x-ray is stable.   PHYSICAL EXAMINATION:  VITAL SIGNS:  The patient is afebrile and vital signs  are stable.  GENERAL:  He has no complaints.  CARDIAC:  Regular rate and rhythm.  LUNGS:  Decreased breath sounds at the bases.  ABDOMEN:  Benign, incisions are clear, dry and intact.   DISCHARGE DISPOSITION:  The patient will be discharged to home in good  condition.   INSTRUCTIONS:  The patient is instructed to resume his regular diet at home.  He is to do no driving or lifting for 2 weeks.  The patient is to increase  activity slowly ambulating 3 to 4 times daily.  He is instructed to continue  his breathing exercises daily.  The patient may shower and cleanse his  wounds with mild soap and water.   The patient has a follow up appointment  with Dr. Cornelius Moras in 1 week where he will have a chest x-ray taken.  The patient  was instructed to call the office if he experiences any problems such as any  redness, drainage, opening from his wound site or a temp greater than 101.5.   MEDICATIONS:  1.  Multivitamin p.o. daily.  2.  Tylox 1 to 2 tablets p.o. every 4 hours p.r.n. pain.      Constance Holster, PA      Salvatore Decent. Cornelius Moras, M.D.  Electronically Signed    JMW/MEDQ  D:  08/02/2005  T:  08/03/2005  Job:  [id number]

## 2010-11-06 NOTE — Op Note (Signed)
NAMECASHTON, Cristian Walker              ACCOUNT NO.:  [id number]   MEDICAL RECORD NO.:  [id number]          PATIENT TYPE:  INP   LOCATION:  2550                         FACILITY:  [facility]   PHYSICIAN:  Salvatore Decent. Cornelius Moras, M.D. DATE OF BIRTH:  21-Nov-1980   DATE OF PROCEDURE:  09/20/2006  DATE OF DISCHARGE:                               OPERATIVE REPORT   PREOPERATIVE DIAGNOSIS:  Recurrent right spontaneous pneumothorax.   POSTOPERATIVE DIAGNOSIS:  Recurrent right spontaneous pneumothorax.   PROCEDURE:  Redo right video-assisted thoracoscopy for apical stapling  of blebs and mechanical pleurodesis.   SURGEON:  Salvatore Decent. Cornelius Moras, M.D.   ASSISTANT:  Theda Belfast, PA   ANESTHESIA:  General.   BRIEF CLINICAL NOTE:  The patient is a 30 year old male with history of  multiple recurrent spontaneous pneumothoraces.  The patient recently  presented on March 15 with recurrent right spontaneous pneumothorax.  He  underwent right VATS with apical stapling of blebs and pleurectomy.  The  patient was ultimately discharged home approximately 1 week later.  The  patient now returns with increased shortness of breath and right-sided  chest pain; and chest x-ray, again, documents the presence of recurrent  right spontaneous pneumothorax.  The patient has been counseled  regarding the indications, risks, and potential benefits of surgery.  A  variety of alternative strategies have been discussed.  He understands  and accepts all associated risks and desires to proceed as described.   OPERATIVE NOTE IN DETAIL:  The patient is brought to the operating room  on the above-mentioned date and placed in the supine position on the  operating table.  General endotracheal anesthesia is induced under the  care and direction of Dr. Sheldon Silvan.  A Foley catheter is placed.  Pneumatic sequential compression boots are placed on both lower  extremities.  The patient is turned to the left lateral decubitus  position, using an axillary roll and a pneumatic beanbag device to  facilitate positioning.  Single lung ventilation is begun.  The  patient's right chest is prepared and draped in a sterile manner.   A small incision is made overlying the fourth intercostal space in the  anterior axillary line.  The incision is completed through the  subcutaneous tissues and intercostal musculature with electrocautery.  The right pleural space is entered bluntly.  There are no sign of  adhesions apparent.  A 10-mm port is passed through the incision and the  right chest is then explored, visually, using a videoscopic camera.  The  right lung is completely collapsed despite recent surgery; and in fact  there are no adhesions appreciated anywhere in the right chest.  There  is remarkably little sign of previous surgery.  Two additional port  incisions are placed including one posteriorly through an old port  incision; and one more inferiorly in the midaxillary line.  The lung is  carefully inspected and retracted.  There is suggestion of retained  blebs along the staple line of the apex of the right lung, and to  separate wedge specimens of the right upper lobe are removed using  endoscopic  GIA stapling device to re-excise the previous staple line.  No other signs of blebs are appreciated, at all, on the surface of the  right upper lobe.  There are some small blebs located on the free margin  of the right middle lobe, and a long thin wedge resection is obtained  along this free margin to resect all visible blebs.  No other blebs are  visualized anywhere in the right chest.  Mechanical pleurodesis is now  performed using a cautery scratchpad to abrade the entire inside lining  of the right chest.  Repeat pleurectomy is obviously not an option as  the parietal pleura has previously been removed.  The inferior pulmonary  ligament is divided inferiorly to facilitate elevation of the right  lower lobe  postoperatively.   The right chest is now drained using two chest tubes exited through two  of the three port incisions.  The remaining posterior port incision is  closed in multiple layers with running absorbable suture.  The OnQue  continuous pain management system is utilized to facilitate  postoperative pain control.  A 5-inch catheter supplied with the On-Que  kit is tunneled into the intercostal musculature between the fifth and  sixth rib longitudinally.  The catheter is flushed with 5 mL of 0.5%  bupivacaine solution and ultimately connected to a continuous infusion  pump.  The chest tubes are fixed to closed suction drainage device.  The  patient tolerated the procedure well, is extubated in the operating  room, and transported to the recovery room in stable condition.  There  are no intraoperative complications.  All sponge, instrument, and needle  counts are verified correct.      Salvatore Decent. Cornelius Moras, M.D.  Electronically Signed     CHO/MEDQ  D:  09/20/2006  T:  09/20/2006  Job:  [id number]

## 2010-11-06 NOTE — Discharge Summary (Signed)
NAMEDE, JAWORSKI              ACCOUNT NO.:  [id number]   MEDICAL RECORD NO.:  [id number]          PATIENT TYPE:  INP   LOCATION:  2030                         FACILITY:  [facility]   PHYSICIAN:  Salvatore Decent. Cornelius Moras, M.D. DATE OF BIRTH:  05/08/1981   DATE OF ADMISSION:  09/03/2006  DATE OF DISCHARGE:                               DISCHARGE SUMMARY   DISCHARGE DATE:  Tentatively one to two days.   ADMISSION DIAGNOSIS:  Recurrent right spontaneous pneumothorax.   DISCHARGE DIAGNOSES:  1. Status post apical bleb resection and mechanical pleurodesis for      recurrent right pneumothorax.  2. History of left spontaneous pneumothorax June of 2005.  3. Right spontaneous pneumothorax July of 2006.  4. Recurrent left spontaneous pneumothorax February of 2007.  5. Tracheobronchitis March of 2006.  6. Remote history of tobacco use.   PROCEDURES:  On September 03, 2006, patient underwent a right video-assisted  thoracoscopy for apical bleb resection and mechanical pleurodesis by Dr.  Tressie Stalker.   HISTORY AND PHYSICAL:  This is 30 year old male with previous history of  right spontaneous pneumothorax in 2006 treated with chest tube  placement.  The patient has also undergone previous left video-assisted  thoracoscopy for bleb resection and pleurodesis for right recurrent left  spontaneous pneumothorax in the past.  Patient now presents with a  recurrent right spontaneous pneumothorax.  After full informed consent,  patient was brought to the room for surgical management.  Please see  dictated history and physical for further details.   HOSPITAL COURSE:  On September 03, 2006, patient underwent right VATS and  apical bleb resection and pleurodesis by Dr. Cornelius Moras without complications.  Postoperatively, the patient has progressed quite well.  Postop day  number one, the patient's chest tube did not have an air leak and he had  minimal drainage.  Patient's chest tube was deceased on Sep 21, 2006.  However, a followup chest x-ray after removal showed a pneumothorax 30  to 40%.  Dr. Charlett Lango then reinserted a right chest tube.  On  September 07, 2006, the patient's chest x-ray was stable and he did not have  an air leak.  He was replaced to waterseal.  Chest tube remained in  place until September 09, 2006.  The patient's chest x-ray was stable with a  small right apical space.  His chest tube will be D/C'd today.  If his  chest x-ray is stable, he may go home in the morning.   Patient's vital signs have remained stable.  He has remained afebrile.  He was extubated without any complications.  He is breathing 96% sats on  room air.  Patient has been doing his incentive spirometry  appropriately.  He is ambulating without difficulty.   DISCHARGE DISPOSITION:  Patient will be discharged home in the next one  to two days provided his chest x-ray remains stable after chest tube  removal.   MEDICATIONS:  1. Tylox one to two tabs every four hours p.r.n.  2. The patient was not on any medications prior to admission.   INSTRUCTIONS:  Patient instructed to  follow a low fat, low-salt diet.  No driving or heavy lifting greater than ten pounds for two weeks.  Patient is to ambulate three to four times daily and increase activity  as tolerated.  He is to call the office if any wound problems shall  arise such as incision erythema, drainage, temperature greater than  101.5.  He may shower and clean his incision with mild soap and water.  He is instructed to continue his breathing exercises.   FOLLOWUP:  Patient will follow up with Dr. Cornelius Moras in two weeks, prior to  seeing Dr. Cornelius Moras he will have a chest x-ray taken at [facility].      Constance Holster, PA      Salvatore Decent. Cornelius Moras, M.D.  Electronically Signed    JMW/MEDQ  D:  09/09/2006  T:  09/09/2006  Job:  [id number]

## 2013-09-10 ENCOUNTER — Encounter (HOSPITAL_COMMUNITY): Payer: Self-pay | Admitting: Emergency Medicine

## 2013-09-10 ENCOUNTER — Emergency Department (INDEPENDENT_AMBULATORY_CARE_PROVIDER_SITE_OTHER): Payer: 59

## 2013-09-10 ENCOUNTER — Emergency Department (HOSPITAL_COMMUNITY)
Admission: EM | Admit: 2013-09-10 | Discharge: 2013-09-10 | Disposition: A | Discharge status: Home / Self Care | Payer: 59 | Source: Home / Self Care | Attending: Family Medicine | Admitting: Family Medicine

## 2013-09-10 DIAGNOSIS — S93432A Sprain of tibiofibular ligament of left ankle, initial encounter: Principal | ICD-10-CM

## 2013-09-10 DIAGNOSIS — S93439A Sprain of tibiofibular ligament of unspecified ankle, initial encounter: Secondary | ICD-10-CM

## 2013-09-10 DIAGNOSIS — S93492A Sprain of other ligament of left ankle, initial encounter: Secondary | ICD-10-CM

## 2013-09-10 HISTORY — DX: Other pneumothorax: J93.83

## 2013-09-10 MED ORDER — IBUPROFEN 800 MG PO TABS: 800.0000 mg | ORAL_TABLET | Freq: Three times a day (TID) | ORAL | Status: DC

## 2013-09-10 NOTE — ED Provider Notes (Signed)
CSN: [id number]     Arrival date & time 09/10/13  1740 History   First MD Initiated Contact with Patient 09/10/13 1842     Chief Complaint  Patient presents with  . Ankle Pain   (Consider location/radiation/quality/duration/timing/severity/associated sxs/prior Treatment) Patient is a [age] y.o. male presenting with ankle pain. The history is provided by the patient and the spouse.  Ankle Pain Location:  Ankle Time since incident:  9 days Injury: yes   Mechanism of injury: fall   Fall:    Fall occurred: slipped on grass with inversion injury to left ankle.   Impact surface:  Grass   Entrapped after fall: no   Ankle location:  L ankle Pain details:    Severity:  Moderate   Progression:  Worsening Chronicity:  New Dislocation: no   Foreign body present:  No foreign bodies Associated symptoms: decreased ROM and swelling   Associated symptoms: no numbness     Past Medical History  Diagnosis Date  . Spontaneous pneumothorax    History reviewed. No pertinent past surgical history. History reviewed. No pertinent family history. History  Substance Use Topics  . Smoking status: Never Smoker   . Smokeless tobacco: Not on file  . Alcohol Use: No    Review of Systems  Constitutional: Negative.   Musculoskeletal: Positive for gait problem and joint swelling.    Allergies  Review of patient's allergies indicates no known allergies.  Home Medications   Current Outpatient Rx  Name  Route  Sig  Dispense  Refill  . ibuprofen (ADVIL,MOTRIN) 800 MG tablet   Oral   Take 1 tablet (800 mg total) by mouth 3 (three) times daily.   30 tablet   1    BP 134/87  Pulse 73  Temp(Src) 97.8 F (36.6 C) (Oral)  Resp 16  SpO2 100% Physical Exam  Nursing note and vitals reviewed. Constitutional: He is oriented to person, place, and time. He appears well-developed and well-nourished.  Musculoskeletal: He exhibits tenderness.       Left ankle: He exhibits decreased range of motion and  ecchymosis. He exhibits no deformity and normal pulse. Tenderness. Lateral malleolus, AITFL and proximal fibula tenderness found. No head of 5th metatarsal tenderness found. Achilles tendon normal. Achilles tendon exhibits no pain.  Neurological: He is alert and oriented to person, place, and time.  Skin: Skin is warm and dry.    ED Course  Procedures (including critical care time) Labs Review Labs Reviewed - No data to display Imaging Review Dg Ankle Complete Left  09/10/2013   CLINICAL DATA:  Fall, pain  EXAM: LEFT ANKLE COMPLETE - 3+ VIEW  COMPARISON:  None.  FINDINGS: Three views of left ankle submitted. No acute fracture or subluxation. There is mild spurring of tip of distal fibula. Ankle mortise is preserved.  IMPRESSION: Negative.   Electronically Signed   By: Natasha MeadLiviu  Pop M.D.   On: 09/10/2013 19:49     MDM   1. High ankle sprain of left lower extremity        Linna HoffJames D Samay Delcarlo, MD 09/10/13 2016

## 2013-09-10 NOTE — ED Notes (Signed)
Pain and swelling left ankle after slipped and fell on wet grass 3-14. Both feet cold, painful to palpate DP and posterior tibial pulses ( both present) ecchymosis noted left foot

## 2013-09-10 NOTE — Discharge Instructions (Signed)
Wear ankle support as needed for comfort, activity as tolerated. Crutches as needed, medicine as needed, soak in warm water nightly, return or see orthopedist if further problems.

## 2015-12-03 ENCOUNTER — Ambulatory Visit (INDEPENDENT_AMBULATORY_CARE_PROVIDER_SITE_OTHER): Payer: BLUE CROSS/BLUE SHIELD | Admitting: Medical

## 2015-12-03 ENCOUNTER — Encounter: Payer: Self-pay | Admitting: Medical

## 2015-12-03 VITALS — BP 128/90 | HR 89 | Ht 65.5 in | Wt 176.0 lb

## 2015-12-03 DIAGNOSIS — R030 Elevated blood-pressure reading, without diagnosis of hypertension: Secondary | ICD-10-CM | POA: Diagnosis not present

## 2015-12-03 DIAGNOSIS — R7301 Impaired fasting glucose: Secondary | ICD-10-CM | POA: Diagnosis not present

## 2015-12-03 DIAGNOSIS — E782 Mixed hyperlipidemia: Secondary | ICD-10-CM

## 2015-12-03 NOTE — Progress Notes (Signed)
Subjective: Chief Complaint  Patient presents with  . New Patient (Initial Visit)    hasnt seen a dr in long time and has labs from work in [employer]. is not fasting and was not fasting for the labs he had at his work    Here as a new patient.   Hasn't been to a doctor in a long time.   Has concerns about cholesterol.  Had wellness check at work, cholesterol and BP numbers were high.  The only other prior significant hx/o is prior multiple spontaneous pneumothorax.   He knows he needs to do better with diet, activity.   Works for [employer], in the truck all day, so gets fast food often, likes his every Friday Prime Rib steak.  No other aggravating or relieving factors. No other complaint.     Past Medical History  Diagnosis Date  . Spontaneous pneumothorax   . Hyperlipidemia   . Elevated blood pressure   . Impaired fasting blood sugar   . Smoking    ROS as in subjective   Objective: BP 128/90 mmHg  Pulse 89  Wt 176 lb (79.833 kg)  General appearance: alert, no distress, WD/WN Neck: supple, no lymphadenopathy, no thyromegaly, no masses Heart: RRR, normal S1, S2, no murmurs Lungs: CTA bilaterally, no wheezes, rhonchi, or rales Pulses: 2+ symmetric, upper and lower extremities, normal cap refill Ext: no edema    Assessment: Encounter Diagnoses  Name Primary?  . Mixed dyslipidemia Yes  . Impaired fasting blood sugar   . Elevated blood-pressure reading without diagnosis of hypertension      Plan: Reviewed his 10/2015 biometric labs showing low HDL, elevated LDL, total cholesterol, elevated TRIG, mildly elevated BP, glucose 130 but he was non fasting.   Advised diet changes, routine exercise, weight loss.  He is motivated to make lifestyle changes.   F/u 1mo fasting for physical

## 2015-12-05 ENCOUNTER — Encounter: Payer: Self-pay | Admitting: Medical

## 2016-03-04 ENCOUNTER — Encounter: Payer: Self-pay | Admitting: Medical

## 2016-03-04 ENCOUNTER — Ambulatory Visit (INDEPENDENT_AMBULATORY_CARE_PROVIDER_SITE_OTHER): Payer: BLUE CROSS/BLUE SHIELD | Admitting: Medical

## 2016-03-04 VITALS — BP 120/78 | HR 86 | Resp 12 | Ht 66.5 in | Wt 174.0 lb

## 2016-03-04 DIAGNOSIS — Z8709 Personal history of other diseases of the respiratory system: Secondary | ICD-10-CM | POA: Diagnosis not present

## 2016-03-04 DIAGNOSIS — Z0000 Encounter for general adult medical examination without abnormal findings: Secondary | ICD-10-CM | POA: Diagnosis not present

## 2016-03-04 DIAGNOSIS — E782 Mixed hyperlipidemia: Secondary | ICD-10-CM | POA: Diagnosis not present

## 2016-03-04 DIAGNOSIS — R030 Elevated blood-pressure reading, without diagnosis of hypertension: Secondary | ICD-10-CM | POA: Diagnosis not present

## 2016-03-04 DIAGNOSIS — J309 Allergic rhinitis, unspecified: Secondary | ICD-10-CM | POA: Diagnosis not present

## 2016-03-04 DIAGNOSIS — K4020 Bilateral inguinal hernia, without obstruction or gangrene, not specified as recurrent: Secondary | ICD-10-CM

## 2016-03-04 LAB — COMPREHENSIVE METABOLIC PANEL
ALT: 27 U/L (ref 9–46)
AST: 18 U/L (ref 10–40)
Albumin: 4.9 g/dL (ref 3.6–5.1)
Alkaline Phosphatase: 78 U/L (ref 40–115)
BUN: 16 mg/dL (ref 7–25)
CHLORIDE: 102 mmol/L (ref 98–110)
CO2: 26 mmol/L (ref 20–31)
CREATININE: 1.06 mg/dL (ref 0.60–1.35)
Calcium: 9.5 mg/dL (ref 8.6–10.3)
GLUCOSE: 108 mg/dL — AB (ref 65–99)
POTASSIUM: 4.5 mmol/L (ref 3.5–5.3)
SODIUM: 138 mmol/L (ref 135–146)
Total Bilirubin: 0.7 mg/dL (ref 0.2–1.2)
Total Protein: 7.3 g/dL (ref 6.1–8.1)

## 2016-03-04 LAB — POCT URINALYSIS DIPSTICK
BILIRUBIN UA: NEGATIVE
GLUCOSE UA: NEGATIVE
KETONES UA: NEGATIVE
Leukocytes, UA: NEGATIVE
Nitrite, UA: NEGATIVE
PH UA: 7
Protein, UA: NEGATIVE
RBC UA: NEGATIVE
Spec Grav, UA: 1.02
Urobilinogen, UA: NEGATIVE

## 2016-03-04 LAB — CBC
HCT: 49.7 % (ref 38.5–50.0)
Hemoglobin: 17.5 g/dL — ABNORMAL HIGH (ref 13.2–17.1)
MCH: 30.5 pg (ref 27.0–33.0)
MCHC: 35.2 g/dL (ref 32.0–36.0)
MCV: 86.6 fL (ref 80.0–100.0)
MPV: 10.2 fL (ref 7.5–12.5)
Platelets: 290 10*3/uL (ref 140–400)
RBC: 5.74 MIL/uL (ref 4.20–5.80)
RDW: 13.1 % (ref 11.0–15.0)
WBC: 6.3 10*3/uL (ref 4.0–10.5)

## 2016-03-04 LAB — LIPID PANEL
CHOL/HDL RATIO: 6.1 ratio — AB (ref <=5.0)
CHOLESTEROL: 237 mg/dL — AB (ref 125–200)
HDL: 39 mg/dL — ABNORMAL LOW (ref >=40)
LDL CALC: 146 mg/dL — AB (ref <130)
Triglycerides: 258 mg/dL — ABNORMAL HIGH (ref <150)
VLDL: 52 mg/dL — AB (ref <30)

## 2016-03-04 LAB — HEMOGLOBIN A1C
Hgb A1c MFr Bld: 5.2 % (ref <5.7)
Mean Plasma Glucose: 103 mg/dL

## 2016-03-04 NOTE — Addendum Note (Signed)
Addended by: Lavell IslamHOTON, Chike Farrington M on: 03/04/2016 09:48 AM   Modules accepted: Orders

## 2016-03-04 NOTE — Progress Notes (Signed)
Subjective:   HPI  Cristian Walker is a 35 y.o. male who presents for a complete physical.  Gets flu shot at work.   Sees eye doctor, us, no other doctors.   Preventative care: Last ophthalmology visit: 10/2015 Last dental visit:2 years ago Last colonoscopy: N/A Last prostate exam: N/A Last EKG:10 years ago in hospital Last labs: wellness from work 5/17  Prior vaccinations: TD or Tdap:01/08/16 Influenza: 2016 Pneumococcal:0 Shingles/Zostavax:0  Concerns: None, been exercising and eating healthy since last visit.   Reviewed their medical, surgical, family, social, medication, and allergy history and updated chart as appropriate.  Past Medical History:  Diagnosis Date  . Elevated blood pressure   . Hyperlipidemia   . Impaired fasting blood sugar   . Smoking   . Spontaneous pneumothorax    3 times, 2009, 2007, 2005    Past Surgical History:  Procedure Laterality Date  . LUNG SURGERY  2009, 2007, 2005   spontaneous pneumothorax x 3 surgeries    Social History   Social History  . Marital status: Single    Spouse name: N/A  . Number of children: N/A  . Years of education: N/A   Occupational History  . Not on file.   Social History Main Topics  . Smoking status: Former Smoker    Years: 0.50  . Smokeless tobacco: Never Used  . Alcohol use No  . Drug use: No  . Sexual activity: Not on file   Other Topics Concern  . 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  Exercise - walking some, better diet in 2017.  02/2016    Family History  Problem Relation Age of Onset  . Hypertension Mother   . Heart disease Father     stent  . Alcohol abuse Father   . Cancer Maternal Grandfather     lung, asbestos    No current outpatient prescriptions on file.  No Known Allergies  Review of Systems Constitutional: -fever, -chills, -sweats, -unexpected weight change, -decreased appetite,  -fatigue Allergy: -sneezing, -itching, -congestion Dermatology: -changing moles, --rash, -lumps ENT: -runny nose, -ear pain, -sore throat, -hoarseness, -sinus pain, -teeth pain, - ringing in ears, -hearing loss, -nosebleeds Cardiology: -chest pain, -palpitations, -swelling, -difficulty breathing when lying flat, -waking up short of breath Respiratory: -cough, -shortness of breath, -difficulty breathing with exercise or exertion, -wheezing, -coughing up blood Gastroenterology: -abdominal pain, -nausea, -vomiting, -diarrhea, -constipation, -blood in stool, -changes in bowel movement, -difficulty swallowing or eating Hematology: -bleeding, -bruising  Musculoskeletal: -joint aches, -muscle aches, -joint swelling, -back pain, -neck pain, -cramping, -changes in gait Ophthalmology: denies vision changes, eye redness, itching, discharge Urology: -burning with urination, -difficulty urinating, -blood in urine, -urinary frequency, -urgency, -incontinence Neurology: -headache, -weakness, -tingling, -numbness, -memory loss, -falls, -dizziness Psychology: -depressed mood, -agitation, -sleep problems     Objective:   Physical Exam  BP 120/78   Pulse 86   Resp 12   Ht 5' 6.5" (1.689 m)   Wt 174 lb (78.9 kg)   BMI 27.66 kg/m   Wt Readings from Last 3 Encounters:  03/04/16 174 lb (78.9 kg)  12/03/15 176 lb (79.8 kg)  08/09/07 124 lb 4 oz (56.4 kg)   BP Readings from Last 3 Encounters:  03/04/16 120/78  12/03/15 128/90  09/10/13 134/87    General appearance: alert, no distress, WD/WN, overweight white male Skin: scattered macules, no worrisome lesions, speckled pink and red raised 3mm papular lesion left knee  anteromedial to patella, unchanged for years per patient HEENT: normocephalic, conjunctiva/corneas normal, sclerae anicteric, PERRLA, EOMi, nares patent, no discharge or erythema, pharynx normal Oral cavity: MMM, tongue normal, teeth -right upper molar broken, moderate stain  throughout Neck: supple, no lymphadenopathy, no thyromegaly, no masses, normal ROM, no bruits Chest: port surgical scars left and right chest wall laterally, similar surgical scars left and right upper back/chest wall, non tender, normal shape and expansion Heart: RRR, normal S1, S2, no murmurs Lungs: CTA bilaterally, no wheezes, rhonchi, or rales Abdomen: +bs, soft, non tender, non distended, no masses, no hepatomegaly, no splenomegaly, no bruits Back: non tender, normal ROM, no scoliosis Musculoskeletal: upper extremities non tender, no obvious deformity, normal ROM throughout, lower extremities non tender, no obvious deformity, normal ROM throughout Extremities: no edema, no cyanosis, no clubbing Pulses: 2+ symmetric, upper and lower extremities, normal cap refill Neurological: alert, oriented x 3, CN2-12 intact, strength normal upper extremities and lower extremities, sensation normal throughout, DTRs 2+ throughout, no cerebellar signs, gait normal Psychiatric: normal affect, behavior normal, pleasant  GU: normal male external genitalia, circumcised, non tender, no masses, small bilateral inguinal hernias, R>L, no lymphadenopathy Rectal: deferred   Assessment and Plan :    Encounter Diagnoses  Name Primary?  . Encounter for health maintenance examination in adult Yes  . History of pneumothorax   . Elevated blood pressure reading without diagnosis of hypertension   . Mixed dyslipidemia   . Allergic rhinitis, unspecified allergic rhinitis type   . Bilateral inguinal hernia without obstruction or gangrene, recurrence not specified     Physical exam - discussed healthy lifestyle, diet, exercise, preventative care, vaccinations, and addressed their concerns.   He will get flu shot at work free Discussed monthly self testicular exams See your eye doctor yearly for routine vision care. See your dentist yearly for routine dental care including hygiene visits twice yearly. Elevated BP on  prior exams - normal today.  C/t healthy lifestyle efforts Dyslipidemia - labs today fasting Glad to hear he is making efforts at healthy diet and exercise Hernia - small, discuses diagnosis, possible complications, watch and wait approach for now, work on core strengthen, back and abdomen exercises, avoiding heavy lifting.   Follow-up pending labs  Cristian Walker was seen today for annual exam.  Diagnoses and all orders for this visit:  Encounter for health maintenance examination in adult -     Comprehensive metabolic panel -     Lipid panel -     CBC -     Hemoglobin A1c  History of pneumothorax  Elevated blood pressure reading without diagnosis of hypertension  Mixed dyslipidemia -     Lipid panel  Allergic rhinitis, unspecified allergic rhinitis type  Bilateral inguinal hernia without obstruction or gangrene, recurrence not specified

## 2016-03-04 NOTE — Patient Instructions (Signed)
Recommendations: See your eye doctor yearly for routine vision care.  See your dentist yearly for routine dental care including hygiene visits twice yearly.  Exercise most days per week  Continue your efforts to reach weight loss goals and healthy lifestyle  We will call with lab results  See me yearly for physical  Dentist recommendation: Dr. Yancey Flemings, dentist [address] [place], [place] [address] [phone] [email or web address]     Inguinal Hernia, Adult Muscles help keep everything in the body in its proper place. But if a weak spot in the muscles develops, something can poke through. That is called a hernia. When this happens in the lower part of the belly (abdomen), it is called an inguinal hernia. (It takes its name from a part of the body in this region called the inguinal canal.) A weak spot in the wall of muscles lets some fat or part of the small intestine bulge through. An inguinal hernia can develop at any age. Men get them more often than women. CAUSES  In adults, an inguinal hernia develops over time.  It can be triggered by:  Suddenly straining the muscles of the lower abdomen.  Lifting heavy objects.  Straining to have a bowel movement. Difficult bowel movements (constipation) can lead to this.  Constant coughing. This may be caused by smoking or lung disease.  Being overweight.  Being pregnant.  Working at a job that requires long periods of standing or heavy lifting.  Having had an inguinal hernia before. One type can be an emergency situation. It is called a strangulated inguinal hernia. It develops if part of the small intestine slips through the weak spot and cannot get back into the abdomen. The blood supply can be cut off. If that happens, part of the intestine may die. This situation requires emergency surgery. SYMPTOMS  Often, a small inguinal hernia has no symptoms. It is found when a healthcare provider does a physical exam. Larger hernias  usually have symptoms.   In adults, symptoms may include:  A lump in the groin. This is easier to see when the person is standing. It might disappear when lying down.  In men, a lump in the scrotum.  Pain or burning in the groin. This occurs especially when lifting, straining or coughing.  A dull ache or feeling of pressure in the groin.  Signs of a strangulated hernia can include:  A bulge in the groin that becomes very painful and tender to the touch.  A bulge that turns red or purple.  Fever, nausea and vomiting.  Inability to have a bowel movement or to pass gas. DIAGNOSIS  To decide if you have an inguinal hernia, a healthcare provider will probably do a physical examination.  This will include asking questions about any symptoms you have noticed.  The healthcare provider might feel the groin area and ask you to cough. If an inguinal hernia is felt, the healthcare provider may try to slide it back into the abdomen.  Usually no other tests are needed. TREATMENT  Treatments can vary. The size of the hernia makes a difference. Options include:  Watchful waiting. This is often suggested if the hernia is small and you have had no symptoms.  No medical procedure will be done unless symptoms develop.  You will need to watch closely for symptoms. If any occur, contact your healthcare provider right away.  Surgery. This is used if the hernia is larger or you have symptoms.  Open surgery. This is  usually an outpatient procedure (you will not stay overnight in a hospital). An cut (incision) is made through the skin in the groin. The hernia is put back inside the abdomen. The weak area in the muscles is then repaired by herniorrhaphy or hernioplasty. Herniorrhaphy: in this type of surgery, the weak muscles are sewn back together. Hernioplasty: a patch or mesh is used to close the weak area in the abdominal wall.  Laparoscopy. In this procedure, a surgeon makes small incisions. A  thin tube with a tiny video camera (called a laparoscope) is put into the abdomen. The surgeon repairs the hernia with mesh by looking with the video camera and using two long instruments. HOME CARE INSTRUCTIONS   After surgery to repair an inguinal hernia:  You will need to take pain medicine prescribed by your healthcare provider. Follow all directions carefully.  You will need to take care of the wound from the incision.  Your activity will be restricted for awhile. This will probably include no heavy lifting for several weeks. You also should not do anything too active for a few weeks. When you can return to work will depend on the type of job that you have.  During "watchful waiting" periods, you should:  Maintain a healthy weight.  Eat a diet high in fiber (fruits, vegetables and whole grains).  Drink plenty of fluids to avoid constipation. This means drinking enough water and other liquids to keep your urine clear or pale yellow.  Do not lift heavy objects.  Do not stand for long periods of time.  Quit smoking. This should keep you from developing a frequent cough. SEEK MEDICAL CARE IF:   A bulge develops in your groin area.  You feel pain, a burning sensation or pressure in the groin. This might be worse if you are lifting or straining.  You develop a fever of more than 100.5 F (38.1 C). SEEK IMMEDIATE MEDICAL CARE IF:   Pain in the groin increases suddenly.  A bulge in the groin gets bigger suddenly and does not go down.  For men, there is sudden pain in the scrotum. Or, the size of the scrotum increases.  A bulge in the groin area becomes red or purple and is painful to touch.  You have nausea or vomiting that does not go away.  You feel your heart beating much faster than normal.  You cannot have a bowel movement or pass gas.  You develop a fever of more than 102.0 F (38.9 C).   This information is not intended to replace advice given to you by your  health care provider. Make sure you discuss any questions you have with your health care provider.   Document Released: 10/24/2008 Document Revised: 08/30/2011 Document Reviewed: 12/09/2014 Elsevier Interactive Patient Education [employer]2016 [employer] Inc[employer]

## 2016-04-28 ENCOUNTER — Ambulatory Visit (INDEPENDENT_AMBULATORY_CARE_PROVIDER_SITE_OTHER): Payer: BLUE CROSS/BLUE SHIELD | Admitting: Medical

## 2016-04-28 ENCOUNTER — Encounter: Payer: Self-pay | Admitting: Medical

## 2016-04-28 VITALS — BP 132/88 | HR 73 | Wt 177.2 lb

## 2016-04-28 DIAGNOSIS — K1379 Other lesions of oral mucosa: Secondary | ICD-10-CM

## 2016-04-28 DIAGNOSIS — K122 Cellulitis and abscess of mouth: Secondary | ICD-10-CM | POA: Diagnosis not present

## 2016-04-28 MED ORDER — HYDROCODONE-ACETAMINOPHEN 7.5-325 MG PO TABS: 1.0000 | ORAL_TABLET | Freq: Four times a day (QID) | ORAL | 0 refills | Status: DC | PRN

## 2016-04-28 MED ORDER — AMOXICILLIN-POT CLAVULANATE 875-125 MG PO TABS: 1.0000 | ORAL_TABLET | Freq: Two times a day (BID) | ORAL | 0 refills | Status: DC

## 2016-04-28 NOTE — Progress Notes (Signed)
Subjective: Chief Complaint  Patient presents with  . blister inside cheek    blister inside mouth , notice yesterday morning, pain    Here for sore in mouth.  Inside of left cheek x 1 days, very painful, throbbing.  Has had cold sores before, but this is different than anything he's had prior.  No fever.   Hurts to talk or open mouth.  Hasn't seen dentist in long time, and he knows he has some decay that has to be taken care of.   No other aggravating or relieving factors. No other complaint.  Past Medical History:  Diagnosis Date  . Elevated blood pressure   . Hyperlipidemia   . Impaired fasting blood sugar   . Smoking   . Spontaneous pneumothorax    3 times, 2009, 2007, 2005   No current outpatient prescriptions on file prior to visit.   No current facility-administered medications on file prior to visit.    ROS as in subjective   Objective BP 132/88   Pulse 73   Wt 177 lb 3.2 oz (80.4 kg)   SpO2 99%   BMI 28.17 kg/m   Gen: wd, wn, nad Left buccal mucosa posteriorly with quite tender 1cm erythematous lesion with central yellowish area like possible pus pocket starting. No other obvious lesions.  There is tooth decay noted bilat upper molars.  No other abnormal findings hent otherwise unremarkable Skin otherwise of face unremarkable Slight swelling of left cheek over the area of concern   Assessment: Encounter Diagnoses  Name Primary?  . Oral abscess Yes  . Mouth pain     Plan: Discussed findings, recommendation, f/u.   Patient Instructions  Recommendations:  Use salt water gargles 2-3 times daily until resolved  Begin Augmentin antibiotic twice daily for 10 days  Take a probiotic while on this antibiotic   Begin pain medication as needed, but caution during the day as it can cause drowsiness  Take OTC Ibuprofen 3 tablets 3 times daily the next few days for pain and inflammation  Follow up here if worse or not much improved in the next 3  days  Otherwise follow up with dentist   Dr. Yancey Flemingsavid Civils, dentist [address], [place], [place] [address] [phone] [email or web address]     Cristian NeedleMichael was seen today for blister inside cheek.  Diagnoses and all orders for this visit:  Oral abscess  Mouth pain  Other orders -     amoxicillin-clavulanate (AUGMENTIN) 875-125 MG tablet; Take 1 tablet by mouth 2 (two) times daily. -     HYDROcodone-acetaminophen (NORCO) 7.5-325 MG tablet; Take 1 tablet by mouth every 6 (six) hours as needed for moderate pain.

## 2016-04-28 NOTE — Patient Instructions (Signed)
Recommendations:  Use salt water gargles 2-3 times daily until resolved  Begin Augmentin antibiotic twice daily for 10 days  Take a probiotic while on this antibiotic   Begin pain medication as needed, but caution during the day as it can cause drowsiness  Take OTC Ibuprofen 3 tablets 3 times daily the next few days for pain and inflammation  Follow up here if worse or not much improved in the next 3 days  Otherwise follow up with dentist   Dr. Yancey Flemingsavid Civils, dentist [address], [place], [place] [address] [phone] [email or web address]

## 2016-09-16 ENCOUNTER — Ambulatory Visit (INDEPENDENT_AMBULATORY_CARE_PROVIDER_SITE_OTHER): Payer: 59 | Admitting: Family Medicine

## 2016-09-16 ENCOUNTER — Encounter: Payer: Self-pay | Admitting: Family Medicine

## 2016-09-16 VITALS — BP 130/84 | HR 88 | Ht 66.5 in | Wt 173.4 lb

## 2016-09-16 DIAGNOSIS — K4090 Unilateral inguinal hernia, without obstruction or gangrene, not specified as recurrent: Secondary | ICD-10-CM

## 2016-09-16 DIAGNOSIS — N451 Epididymitis: Secondary | ICD-10-CM

## 2016-09-16 MED ORDER — LEVOFLOXACIN 500 MG PO TABS: 500.0000 mg | ORAL_TABLET | Freq: Every day | ORAL | 0 refills | Status: DC

## 2016-09-16 NOTE — Patient Instructions (Signed)
Epididymitis Epididymitis is swelling (inflammation) of the epididymis. The epididymis is a cord-like structure that is located along the top and back part of the testicle. It collects and stores sperm from the testicle. This condition can also cause pain and swelling of the testicle and scrotum. Symptoms usually start suddenly (acute epididymitis). Sometimes epididymitis starts gradually and lasts for a while (chronic epididymitis). This type may be harder to treat. What are the causes? In men 35 and younger, this condition is usually caused by a bacterial infection or sexually transmitted disease (STD), such as:  Gonorrhea.  Chlamydia. In men 35 and older who do not have anal sex, this condition is usually caused by bacteria from a blockage or abnormalities in the urinary system. These can result from:  Having a tube placed into the bladder (urinary catheter).  Having an enlarged or inflamed prostate gland.  Having recent urinary tract surgery. In men who have a condition that weakens the body's defense system (immune system), such as HIV, this condition can be caused by:  Other bacteria, including tuberculosis and syphilis.  Viruses.  Fungi. Sometimes this condition occurs without infection. That may happen if urine flows backward into the epididymis after heavy lifting or straining. What increases the risk? This condition is more likely to develop in men:  Who have unprotected sex with more than one partner.  Who have anal sex.  Who have recently had surgery.  Who have a urinary catheter.  Who have urinary problems.  Who have a suppressed immune system. What are the signs or symptoms? This condition usually begins suddenly with chills, fever, and pain behind the scrotum and in the testicle. Other symptoms include:  Swelling of the scrotum, testicle, or both.  Pain whenejaculatingor urinating.  Pain in the back or belly.  Nausea.  Itching and discharge from the  penis.  Frequent need to pass urine.  Redness and tenderness of the scrotum. How is this diagnosed? Your health care provider can diagnose this condition based on your symptoms and medical history. Your health care provider will also do a physical exam to ask about your symptoms and check your scrotum and testicle for swelling, pain, and redness. You may also have other tests, including:  Examination of discharge from the penis.  Urine tests for infections, such as STDs. Your health care provider may test you for other STDs, including HIV. How is this treated? Treatment for this condition depends on the cause. If your condition is caused by a bacterial infection, oral antibiotic medicine may be prescribed. If the bacterial infection has spread to your blood, you may need to receive IV antibiotics. Nonbacterial epididymitis is treated with home care that includes bed rest and elevation of the scrotum. Surgery may be needed to treat:  Bacterial epididymitis that causes pus to build up in the scrotum (abscess).  Chronic epididymitis that has not responded to other treatments. Follow these instructions at home: Medicines   Take over-the-counter and prescription medicines only as told by your health care provider.  If you were prescribed an antibiotic medicine, take it as told by your health care provider. Do not stop taking the antibiotic even if your condition improves. Sexual Activity   If your epididymitis was caused by an STD, avoid sexual activity until your treatment is complete.  Inform your sexual partner or partners if you test positive for an STD. They may need to be treated.Do not engage in sexual activity with your partner or partners until their treatment is   completed. General instructions   Return to your normal activities as told by your health care provider. Ask your health care provider what activities are safe for you.  Keep your scrotum elevated and supported while  resting. Ask your health care provider if you should wear a scrotal support, such as a jockstrap. Wear it as told by your health care provider.  If directed, apply ice to the affected area:  Put ice in a plastic bag.  Place a towel between your skin and the bag.  Leave the ice on for 20 minutes, 2-3 times per day.  Try taking a sitz bath to help with discomfort. This is a warm water bath that is taken while you are sitting down. The water should only come up to your hips and should cover your buttocks. Do this 3-4 times per day or as told by your health care provider.  Keep all follow-up visits as told by your health care provider. This is important. Contact a health care provider if:  You have a fever.  Your pain medicine is not helping.  Your pain is getting worse.  Your symptoms do not improve within three days. This information is not intended to replace advice given to you by your health care provider. Make sure you discuss any questions you have with your health care provider. Document Released: 06/04/2000 Document Revised: 11/13/2015 Document Reviewed: 10/23/2014 Elsevier Interactive Patient Education  2017 [employer] Inc[employer]    Inguinal Hernia, Adult An inguinal hernia is when fat or the intestines push through the area where the leg meets the lower abdomen (groin) and create a rounded lump (bulge). This condition develops over time. There are three types of inguinal hernias. These types include:  Hernias that can be pushed back into the belly (are reducible).  Hernias that are not reducible (are incarcerated).  Hernias that are not reducible and lose their blood supply (are strangulated). This type of hernia requires emergency surgery. What are the causes? This condition is caused by having a weak spot in the muscles or tissue. This weakness lets the hernia poke through. This condition can be triggered by:  Suddenly straining the muscles of the lower abdomen.  Lifting  heavy objects.  Straining to have a bowel movement. Difficult bowel movements (constipation) can lead to this.  Coughing. What increases the risk? This condition is more likely to develop in:  Men.  Pregnant women.  People who:  Are overweight.  Work in jobs that require long periods of standing or heavy lifting.  Have had an inguinal hernia before.  Smoke or have lung disease. These factors can lead to long-lasting (chronic) coughing. What are the signs or symptoms? Symptoms can depend on the size of the hernia. Often, a small inguinal hernia has no symptoms. Symptoms of a larger hernia include:  A lump in the groin. This is easier to see when the person is standing. It might not be visible when he or she is lying down.  Pain or burning in the groin. This occurs especially when lifting, straining, or coughing.  A dull ache or a feeling of pressure in the groin.  A lump in the scrotum in men. Symptoms of a strangulated inguinal hernia can include:  A bulge in the groin that is very painful and tender to the touch.  A bulge that turns red or purple.  Fever, nausea, and vomiting.  The inability to have a bowel movement or to pass gas. How is this diagnosed? This condition  is diagnosed with a medical history and physical exam. Your health care provider may feel your groin area and ask you to cough. How is this treated? Treatment for this condition varies depending on the size of your hernia and whether you have symptoms. If you do not have symptoms, your health care provider may have you watch your hernia carefully and come in for follow-up visits. If your hernia is larger or if you have symptoms, your treatment will include surgery. Follow these instructions at home: Lifestyle   Drink enough fluid to keep your urine clear or pale yellow.  Eat a diet that includes a lot of fiber. Eat plenty of fruits, vegetables, and whole grains. Talk with your health care provider if  you have questions.  Avoid lifting heavy objects.  Avoid standing for long periods of time.  Do not use tobacco products, including cigarettes, chewing tobacco, or e-cigarettes. If you need help quitting, ask your health care provider.  Maintain a healthy weight. General instructions   Do not try to force the hernia back in.  Watch your hernia for any changes in color or size. Let your health care provider know if any changes occur.  Take over-the-counter and prescription medicines only as told by your health care provider.  Keep all follow-up visits as told by your health care provider. This is important. Contact a health care provider if:  You have a fever.  You have new symptoms.  Your symptoms get worse. Get help right away if:  You have pain in the groin that suddenly gets worse.  A bulge in the groin gets bigger suddenly and does not go down.  You are a man and you have a sudden pain in the scrotum, or the size of your scrotum suddenly changes.  A bulge in the groin area becomes red or purple and is painful to the touch.  You have nausea or vomiting that does not go away.  You feel your heart beating a lot more quickly than normal.  You cannot have a bowel movement or pass gas. This information is not intended to replace advice given to you by your health care provider. Make sure you discuss any questions you have with your health care provider. Document Released: 10/24/2008 Document Revised: 11/13/2015 Document Reviewed: 04/17/2014 Elsevier Interactive Patient Education  2017 [employer]

## 2016-09-16 NOTE — Progress Notes (Signed)
Chief Complaint  Patient presents with  . Testicle Pain    has known hernia diagnosed by [facility]. Over the last few weeks right sided testicular pain has increased.    Right testicle has been sore over the last couple of weeks.  Also having more pain over his known right inguinal hernia. He had significant pain with intercourse the other night.  Has had a small bulge at the right groin for years, diagnosed as a hernia.  This swelling/bulge has not increased, but is getting tender.  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  Last week did some heavier lifting   Some days he has darker urine, other times he reports voiding more frequently, but admits to drinking a lot of fluids. Denies dysuria, hematuria  He presents accompanied by his wife, and denies any risks for STDs. He had root canal yesterday and is taking ibuprofen and amoxil.   PMH, PSH, SH reviewed. Outpatient Encounter Prescriptions as of 09/16/2016  Medication Sig  . amoxicillin (AMOXIL) 875 MG tablet Take 875 mg by mouth 2 (two) times daily.   . [DISCONTINUED] amoxicillin-clavulanate (AUGMENTIN) 875-125 MG tablet Take 1 tablet by mouth 2 (two) times daily.  . [DISCONTINUED] HYDROcodone-acetaminophen (NORCO) 7.5-325 MG tablet Take 1 tablet by mouth every 6 (six) hours as needed for moderate pain.   No facility-administered encounter medications on file as of 09/16/2016.    On amoxil for recent root canal (yesterday).  No Known Allergies  ROS:  No fever, chills, headaches (occasional, related to dental issue), dizziness, chest pain, palpitations, nausea, vomiting, diarrhea. No urinary complaints. No bleeding, bruising, rashes.  See HPI  PHYSICAL EXAM:  BP 130/84 (BP Location: Left Arm, Patient Position: Sitting, Cuff Size: Normal)   Pulse 88   Ht 5' 6.5" (1.689 m)   Wt 173 lb 6.4 oz (78.7  kg)   BMI 27.57 kg/m   Well developed, pleasant male, in no distress HEENT: conjunctiva and sclera clear, EOMI Neck: no lymphadenopathy or mass Heart: regular rate and rhythm Lungs: clear bilaterally Abdomen: soft, nontender Tender at right groin, no lymphadenopathy.  Reducible hernia. Tender at inguinal canal and over swollen area/hernia when standing. nontender on the left Testicles--normal size and shape bilaterally, nontender.  Area of tenderness is posterior, over the epididymis on the right. Extremities: no edema Skin: normal turgor, no rash, redness, swelling Psych: normal mood, affect, hygiene, grooming  ASSESSMENT/PLAN:  Right inguinal hernia - increasingly symptomatic; not incarcerated.  refer for elective repair - Plan: Ambulatory referral to General Surgery  Epididymitis - risks/side effects of quinolone treatment reviewed.  Pain may possibly be related to hernia, but very tender focally over epididymis on exam - Plan: levofloxacin (LEVAQUIN) 500 MG tablet    Refer to [facility] for elective right inguinal hernia repair Treating for presumptive epididymitis given extreme tenderness of epidydmis on exam today  All questions answered, counseled re s/sx incarcerated hernia, emergencies, risks/side effects of meds, supportive measures.

## 2016-10-01 DIAGNOSIS — K4090 Unilateral inguinal hernia, without obstruction or gangrene, not specified as recurrent: Secondary | ICD-10-CM | POA: Diagnosis not present

## 2016-11-05 DIAGNOSIS — K4090 Unilateral inguinal hernia, without obstruction or gangrene, not specified as recurrent: Secondary | ICD-10-CM | POA: Diagnosis not present

## 2016-11-18 ENCOUNTER — Ambulatory Visit (INDEPENDENT_AMBULATORY_CARE_PROVIDER_SITE_OTHER): Payer: 59 | Admitting: Medical

## 2016-11-18 ENCOUNTER — Encounter: Payer: Self-pay | Admitting: Medical

## 2016-11-18 VITALS — BP 132/80 | HR 89 | Temp 98.7°F | Wt 169.0 lb

## 2016-11-18 DIAGNOSIS — F1123 Opioid dependence with withdrawal: Secondary | ICD-10-CM | POA: Diagnosis not present

## 2016-11-18 DIAGNOSIS — Z8719 Personal history of other diseases of the digestive system: Secondary | ICD-10-CM

## 2016-11-18 DIAGNOSIS — R109 Unspecified abdominal pain: Secondary | ICD-10-CM

## 2016-11-18 DIAGNOSIS — N451 Epididymitis: Secondary | ICD-10-CM | POA: Diagnosis not present

## 2016-11-18 DIAGNOSIS — Z9889 Other specified postprocedural states: Secondary | ICD-10-CM | POA: Diagnosis not present

## 2016-11-18 DIAGNOSIS — F1193 Opioid use, unspecified with withdrawal: Secondary | ICD-10-CM

## 2016-11-18 MED ORDER — CIPROFLOXACIN HCL 500 MG PO TABS: 500.0000 mg | ORAL_TABLET | Freq: Two times a day (BID) | ORAL | 0 refills | Status: DC

## 2016-11-18 MED ORDER — HYDROCODONE-ACETAMINOPHEN 5-325 MG PO TABS: 1.0000 | ORAL_TABLET | Freq: Four times a day (QID) | ORAL | 0 refills | Status: DC | PRN

## 2016-11-18 MED ORDER — ALPRAZOLAM 0.5 MG PO TABS: ORAL_TABLET | ORAL | 0 refills | Status: DC

## 2016-11-18 NOTE — Progress Notes (Signed)
Subjective: Chief Complaint  Patient presents with  . feeling jittery    feeling jittery from pain meds, he stopped taking on sunday    Here for f/u.  Had hernia surgery 11/05/16.  Was prescribed oxycodone.  He was taking 2 tablets q4 hours, then went to 1 tablet q4 hours.  He ran out and stopped abruptly 6 days ago.  Since then has been jittery, nervous feeling.   Using tylenol for pain and its not helping.  Still in a fair amount of pain in right lower abdomen and right testicle.  abdomen and testicle feels swollen a bit.   No fever, no NVD.  No urinary c/o.  No other aggravating or relieving factors. No other complaint.   Past Medical History:  Diagnosis Date  . Elevated blood pressure   . Hyperlipidemia   . Impaired fasting blood sugar   . Smoking   . Spontaneous pneumothorax    3 times, 2009, 2007, 2005   No current outpatient prescriptions on file prior to visit.   No current facility-administered medications on file prior to visit.    ROS as in subjective   Objective: BP 132/80   Pulse 89   Temp 98.7 F (37.1 C)   Wt 169 lb (76.7 kg)   SpO2 99%   BMI 26.87 kg/m   General appearance: alert, no distress, WD/WN,  Abdomen: +bs, soft, surgical port scars right and left lower abdomen, puffiness of right lower abdomen, tender over right lower abdomin in general, otherwise non tender, non distended, no masses, no hepatomegaly, no splenomegaly Back: non tender Musculoskeletal: hips non tender, normal ROM  No joint swelling, otherwise non tender, no swelling, no obvious deformity Extremities: no edema, no cyanosis, no clubbing Pulses: 2+ symmetric, upper and lower extremities, normal cap refill Neurological: alert, oriented x 3, CN2-12 intact, strength normal upper extremities and lower extremities, sensation normal throughout, DTRs 2+ throughout, no cerebellar signs, gait normal Psychiatric: normal affect, behavior normal, pleasant  GU: right testicle with fullness,  tenderness, mild swelling, asymmetrical to left. Otherwise circumcised normal GU exam.    Assessment: Encounter Diagnoses  Name Primary?  . Epididymitis Yes  . S/P hernia repair   . Abdominal pain, unspecified abdominal location   . 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  unfortunately he was on scheduled narcotics and stopped abruptly.   Will help with withdrawal symptoms, but his pain seems to be worse from epididymal infection vs swelling/inflammation vs other process.  If not much resolved within a week, then plan to get ultrasound, possible urology consult.  He sees [facility] on 12/03/16.  Recommendations  Begin Cipro antibiotic, twice daily for 5 days  Use Xanax 1 tablet either twice daily to calm the nerve/jitters.  Use no more than 3 times daily the next few days  If you are feeling better within 3 days, then stop Xanax  For pain, you can either use OTC Tylenol or Ibuprofen, or you can use the Hydrocodone I prescribed no more than twice daily for pain short term  We need to limit the Hydrocodone   F/u 1wk.

## 2016-11-18 NOTE — Patient Instructions (Signed)
Recommendations  Begin Cipro antibiotic, twice daily for 5 days  Use Xanax 1 tablet either twice daily to calm the nerve/jitters.  Use no more than 3 times daily the next few days  If you are feeling better within 3 days, then stop Xanax  For pain, you can either use OTC Tylenol or Ibuprofen, or you can use the Hydrocodone I prescribed no more than twice daily for pain short term  We need to limit the Hydrocodone    Epididymitis Epididymitis is swelling (inflammation) of the epididymis. The epididymis is a cord-like structure that is located along the top and back part of the testicle. It collects and stores sperm from the testicle. This condition can also cause pain and swelling of the testicle and scrotum. Symptoms usually start suddenly (acute epididymitis). Sometimes epididymitis starts gradually and lasts for a while (chronic epididymitis). This type may be harder to treat. What are the causes? In men 3135 and younger, this condition is usually caused by a bacterial infection or sexually transmitted disease (STD), such as:  Gonorrhea.  Chlamydia.  In men 6835 and older who do not have anal sex, this condition is usually caused by bacteria from a blockage or abnormalities in the urinary system. These can result from:  Having a tube placed into the bladder (urinary catheter).  Having an enlarged or inflamed prostate gland.  Having recent urinary tract surgery.  In men who have a condition that weakens the body's defense system (immune system), such as HIV, this condition can be caused by:  Other bacteria, including tuberculosis and syphilis.  Viruses.  Fungi.  Sometimes this condition occurs without infection. That may happen if urine flows backward into the epididymis after heavy lifting or straining. What increases the risk? This condition is more likely to develop in men:  Who have unprotected sex with more than one partner.  Who have anal sex.  Who have recently had  surgery.  Who have a urinary catheter.  Who have urinary problems.  Who have a suppressed immune system.  What are the signs or symptoms? This condition usually begins suddenly with chills, fever, and pain behind the scrotum and in the testicle. Other symptoms include:  Swelling of the scrotum, testicle, or both.  Pain whenejaculatingor urinating.  Pain in the back or belly.  Nausea.  Itching and discharge from the penis.  Frequent need to pass urine.  Redness and tenderness of the scrotum.  How is this diagnosed? Your health care provider can diagnose this condition based on your symptoms and medical history. Your health care provider will also do a physical exam to ask about your symptoms and check your scrotum and testicle for swelling, pain, and redness. You may also have other tests, including:  Examination of discharge from the penis.  Urine tests for infections, such as STDs.  Your health care provider may test you for other STDs, including HIV. How is this treated? Treatment for this condition depends on the cause. If your condition is caused by a bacterial infection, oral antibiotic medicine may be prescribed. If the bacterial infection has spread to your blood, you may need to receive IV antibiotics. Nonbacterial epididymitis is treated with home care that includes bed rest and elevation of the scrotum. Surgery may be needed to treat:  Bacterial epididymitis that causes pus to build up in the scrotum (abscess).  Chronic epididymitis that has not responded to other treatments.  Follow these instructions at home: Medicines  Take over-the-counter and prescription medicines  only as told by your health care provider.  If you were prescribed an antibiotic medicine, take it as told by your health care provider. Do not stop taking the antibiotic even if your condition improves. Sexual Activity  If your epididymitis was caused by an STD, avoid sexual activity until  your treatment is complete.  Inform your sexual partner or partners if you test positive for an STD. They may need to be treated.Do not engage in sexual activity with your partner or partners until their treatment is completed. General instructions  Return to your normal activities as told by your health care provider. Ask your health care provider what activities are safe for you.  Keep your scrotum elevated and supported while resting. Ask your health care provider if you should wear a scrotal support, such as a jockstrap. Wear it as told by your health care provider.  If directed, apply ice to the affected area: ? Put ice in a plastic bag. ? Place a towel between your skin and the bag. ? Leave the ice on for 20 minutes, 2-3 times per day.  Try taking a sitz bath to help with discomfort. This is a warm water bath that is taken while you are sitting down. The water should only come up to your hips and should cover your buttocks. Do this 3-4 times per day or as told by your health care provider.  Keep all follow-up visits as told by your health care provider. This is important. Contact a health care provider if:  You have a fever.  Your pain medicine is not helping.  Your pain is getting worse.  Your symptoms do not improve within three days. This information is not intended to replace advice given to you by your health care provider. Make sure you discuss any questions you have with your health care provider. Document Released: 06/04/2000 Document Revised: 11/13/2015 Document Reviewed: 10/23/2014 Elsevier Interactive Patient Education  2018 [employer].

## 2016-11-25 ENCOUNTER — Telehealth: Payer: Self-pay | Admitting: Medical

## 2016-11-25 NOTE — Telephone Encounter (Signed)
He said that the pain is area of his surgery , I told him to follow up with surgeon . To see if it coming from his surgery

## 2016-11-25 NOTE — Telephone Encounter (Signed)
Pt called & states that he has not improved at all, still has pain & welling, Cipro hasn't helped.  Wants to know about the ultrasound or what you want him to do next.  Please call pt & advise

## 2016-11-25 NOTE — Telephone Encounter (Signed)
Called pt and l/m for pt call us back.

## 2016-11-25 NOTE — Telephone Encounter (Signed)
If the pain is no better or worse, or if worse swelling in abdomen, have him call to be worked in with Urology urgently!

## 2017-03-29 ENCOUNTER — Ambulatory Visit (INDEPENDENT_AMBULATORY_CARE_PROVIDER_SITE_OTHER): Payer: 59 | Admitting: Medical

## 2017-03-29 ENCOUNTER — Encounter: Payer: Self-pay | Admitting: Medical

## 2017-03-29 VITALS — BP 128/74 | Temp 98.6°F | Wt 169.0 lb

## 2017-03-29 DIAGNOSIS — R05 Cough: Secondary | ICD-10-CM

## 2017-03-29 DIAGNOSIS — J0110 Acute frontal sinusitis, unspecified: Secondary | ICD-10-CM | POA: Diagnosis not present

## 2017-03-29 DIAGNOSIS — R059 Cough, unspecified: Secondary | ICD-10-CM | POA: Insufficient documentation

## 2017-03-29 MED ORDER — AMOXICILLIN 500 MG PO TABS: ORAL_TABLET | ORAL | 0 refills | Status: DC

## 2017-03-29 NOTE — Progress Notes (Signed)
Subjective: Chief Complaint  Patient presents with  . coughing    coughing, nasal congestion, sinus pressure x 5days     Cristian Walker is a 36 y.o. male who presents for "crud"  Started over a week ago with runny nose, but has persisted with sinus pressure, mucoid nasal drainage, coughing a lot, head congestion.  This morning head felt like it would pop.  Taking Claritin D q12 hours, delsym q12 hours.   Green mucous drainage.   Irritated throat, no ear pain.  No fever, no NVD.   + sick contacts at work.   Nonsmoker.  No other aggravating or relieving factors.  No other c/o.  Past Medical History:  Diagnosis Date  . Elevated blood pressure   . Hyperlipidemia   . Impaired fasting blood sugar   . Smoking   . Spontaneous pneumothorax    3 times, 2009, 2007, 2005   No current outpatient prescriptions on file prior to visit.   No current facility-administered medications on file prior to visit.     ROS as in subjective   Objective: BP 128/74   Temp 98.6 F (37 C)   Wt 169 lb (76.7 kg)   BMI 26.87 kg/m   General appearance: Alert, WD/WN, no distress                             Skin: warm, no rash                           Head: + frontal and maxillary sinus tenderness,                            Eyes: conjunctiva normal, corneas clear, PERRLA                            Ears: pearly TMs, external ear canals normal                          Nose: septum midline, turbinates swollen, with erythema and mucoid discharge             Mouth/throat: MMM, tongue normal, mild pharyngeal erythema                           Neck: supple, no adenopathy, no thyromegaly, non tender                          Heart: RRR, normal S1, S2, no murmurs                         Lungs: CTA bilaterally, no wheezes, rales, or rhonchi       Assessment  Encounter Diagnoses  Name Primary?  . Acute non-recurrent frontal sinusitis Yes  . Cough       Plan:  Discussed symptoms, concerns.  Specific home  care recommendations today include:  Only take over-the-counter (OTC) or prescription medicines for pain, discomfort, or fever as directed by your caregiver.    Decongestant: You may use OTC Guaifenesin (Mucinex plain) for congestion.  You may use Pseudoephedrine (Sudafed) only if you don't have blood pressure problems or a diagnosis of hypertension.  Cough suppression: If you have cough  from drainage, you may use over-the-counter Dextromethorphan (Delsym) as directed on the label  Pain/fever relief: You may use over-the-counter Tylenol for pain or fever  Drink extra fluids. Fluids help thin the mucus so your sinuses can drain more easily.   Applying either moist heat or ice packs to the sinus areas may help relieve discomfort.  Use saline nasal sprays to help moisten your sinuses. The sprays can be found at your local drugstore.   Prescription (s) given:Amoxicillin  Cristian Walker was seen today for coughing.  Diagnoses and all orders for this visit:  Acute non-recurrent frontal sinusitis  Cough  Other orders -     amoxicillin (AMOXIL) 500 MG tablet; 2 tablets po BID x 10 days    Patient was advised to call or return if worse or not improving in the next few days.    Patient voiced understanding of diagnosis, recommendations, and treatment plan.

## 2017-07-01 ENCOUNTER — Other Ambulatory Visit: Payer: Self-pay

## 2017-07-01 ENCOUNTER — Ambulatory Visit (HOSPITAL_COMMUNITY)
Admission: EM | Admit: 2017-07-01 | Discharge: 2017-07-01 | Disposition: A | Discharge status: Home / Self Care | Payer: 59 | Attending: Internal Medicine | Admitting: Internal Medicine

## 2017-07-01 ENCOUNTER — Encounter (HOSPITAL_COMMUNITY): Payer: Self-pay | Admitting: Emergency Medicine

## 2017-07-01 DIAGNOSIS — B349 Viral infection, unspecified: Secondary | ICD-10-CM

## 2017-07-01 MED ORDER — BENZONATATE 100 MG PO CAPS: 100.0000 mg | ORAL_CAPSULE | Freq: Three times a day (TID) | ORAL | 0 refills | Status: DC

## 2017-07-01 MED ORDER — AMOXICILLIN-POT CLAVULANATE 875-125 MG PO TABS: 1.0000 | ORAL_TABLET | Freq: Two times a day (BID) | ORAL | 0 refills | Status: AC

## 2017-07-01 MED ORDER — IPRATROPIUM BROMIDE 0.06 % NA SOLN: 2.0000 | Freq: Four times a day (QID) | NASAL | 0 refills | Status: DC

## 2017-07-01 MED ORDER — FLUTICASONE PROPIONATE 50 MCG/ACT NA SUSP: 2.0000 | Freq: Every day | NASAL | 0 refills | Status: DC

## 2017-07-01 NOTE — ED Provider Notes (Signed)
[facility]    CSN: [id number] Arrival date & time: 07/01/17  1325     History   Chief Complaint Chief Complaint  Patient presents with  . URI    HPI Sherrill RaringMichael B Murren is a 37 y.o. male.   37 year old male comes in for 6 day history of URI symptoms. He has had cough, nasal congestion, nasal drainage. States cough has gotten progressively worse, states was productive. Denies fever, chills, nigth sweats. Denies shortness of breath. otc decongestant with little relief. Former smoker, 12-15 pack year history. Denies needing inhalers in the past.       Past Medical History:  Diagnosis Date  . Elevated blood pressure   . Hyperlipidemia   . Impaired fasting blood sugar   . Smoking   . Spontaneous pneumothorax    3 times, 2009, 2007, 2005    Patient Active Problem List   Diagnosis Date Noted  . Acute non-recurrent frontal sinusitis 03/29/2017  . Cough 03/29/2017  . Encounter for health maintenance examination in adult 03/04/2016  . History of pneumothorax 03/04/2016  . Elevated blood pressure reading without diagnosis of hypertension 03/04/2016  . Mixed dyslipidemia 03/04/2016    Past Surgical History:  Procedure Laterality Date  . LUNG SURGERY  2009, 2007, 2005   spontaneous pneumothorax x 3 surgeries       Home Medications    Prior to Admission medications   Medication Sig Start Date End Date Taking? Authorizing Provider  amoxicillin-clavulanate (AUGMENTIN) 875-125 MG tablet Take 1 tablet by mouth every 12 (twelve) hours for 7 days. Please fill on 1/16 if symptoms not improving/resolving 07/06/17 07/13/17  Yu, Amy V, PA-C  benzonatate (TESSALON) 100 MG capsule Take 1 capsule (100 mg total) by mouth every 8 (eight) hours. 07/01/17   Cathie HoopsYu, Amy V, PA-C  Cetirizine HCl (ALL DAY ALLERGY) 10 MG CAPS Take by mouth.    [provider]  fluticasone (FLONASE) 50 MCG/ACT nasal spray Place 2 sprays into both nostrils daily. 07/01/17   Cathie HoopsYu, Amy V, PA-C    ipratropium (ATROVENT) 0.06 % nasal spray Place 2 sprays into both nostrils 4 (four) times daily. 07/01/17   Belinda FisherYu, Amy V, PA-C    Family History Family History  Problem Relation Age of Onset  . Hypertension Mother   . Heart disease Father        stent  . Alcohol abuse Father   . Cancer Maternal Grandfather        lung, asbestos    Social History Social History   Tobacco Use  . Smoking status: Former Smoker    Years: 0.50  . Smokeless tobacco: Never Used  Substance Use Topics  . Alcohol use: No  . Drug use: No     Allergies   Patient has no known allergies.   Review of Systems Review of Systems  Reason unable to perform ROS: See HPI as above.     Physical Exam Triage Vital Signs ED Triage Vitals [07/01/17 1340]  Enc Vitals Group     BP (!) 143/99     Pulse Rate 92     Resp      Temp 98.3 F (36.8 C)     Temp Source Oral     SpO2 100 %     Weight      Height      Head Circumference      Peak Flow      Pain Score      Pain Loc  Pain Edu?      Excl. in GC?    No data found.  Updated Vital Signs BP (!) 143/99 (BP Location: Left Arm)   Pulse 92   Temp 98.3 F (36.8 C) (Oral)   SpO2 100%   Physical Exam  Constitutional: He is oriented to person, place, and time. He appears well-developed and well-nourished. No distress.  HENT:  Head: Normocephalic and atraumatic.  Right Ear: External ear and ear canal normal. Tympanic membrane is erythematous. Tympanic membrane is not bulging.  Left Ear: External ear and ear canal normal. Tympanic membrane is erythematous. Tympanic membrane is not bulging.  Nose: Mucosal edema present. Right sinus exhibits maxillary sinus tenderness. Right sinus exhibits no frontal sinus tenderness. Left sinus exhibits maxillary sinus tenderness. Left sinus exhibits no frontal sinus tenderness.  Mouth/Throat: Uvula is midline, oropharynx is clear and moist and mucous membranes are normal.  Eyes: Conjunctivae are normal. Pupils are  equal, round, and reactive to light.  Neck: Normal range of motion. Neck supple.  Cardiovascular: Normal rate, regular rhythm and normal heart sounds. Exam reveals no gallop and no friction rub.  No murmur heard. Pulmonary/Chest: Effort normal and breath sounds normal. He has no decreased breath sounds. He has no wheezes. He has no rhonchi. He has no rales.  Lymphadenopathy:    He has no cervical adenopathy.  Neurological: He is alert and oriented to person, place, and time.  Skin: Skin is warm and dry.  Psychiatric: He has a normal mood and affect. His behavior is normal. Judgment normal.     UC Treatments / Results  Labs (all labs ordered are listed, but only abnormal results are displayed) Labs Reviewed - No data to display  EKG  EKG Interpretation None       Radiology No results found.  Procedures Procedures (including critical care time)  Medications Ordered in UC Medications - No data to display   Initial Impression / Assessment and Plan / UC Course  I have reviewed the triage vital signs and the nursing notes.  Pertinent labs & imaging results that were available during my care of the patient were reviewed by me and considered in my medical decision making (see chart for details).    Discussed with patient history and exam most consistent with viral URI. Symptomatic treatment as needed. Push fluids.  Rx of Augmentin sent to pharmacy, can fill in 4 days if symptoms not improving for sinusitis.  Return precautions given.  Patient expresses understanding and agrees to plan.   Final Clinical Impressions(s) / UC Diagnoses   Final diagnoses:  Viral syndrome    ED Discharge Orders        Ordered    amoxicillin-clavulanate (AUGMENTIN) 875-125 MG tablet  Every 12 hours     07/01/17 1437    benzonatate (TESSALON) 100 MG capsule  Every 8 hours     07/01/17 1437    fluticasone (FLONASE) 50 MCG/ACT nasal spray  Daily     07/01/17 1437    ipratropium (ATROVENT) 0.06  % nasal spray  4 times daily     07/01/17 1437        Belinda Fisher, PA-C 07/01/17 1442

## 2017-07-01 NOTE — ED Triage Notes (Signed)
Pt reports cough, nasal congestion and nasal drainage since Sunday.  Pt has been taking OTC decongestants with very little relief.

## 2017-07-01 NOTE — Discharge Instructions (Signed)
Tessalon for cough. Start flonase, zyrtec-D for nasal congestion. You can use over the counter nasal saline rinse such as neti pot for nasal congestion. Keep hydrated, your urine should be clear to pale yellow in color. Tylenol/motrin for fever and pain. Monitor for any worsening of symptoms, chest pain, shortness of breath, wheezing, swelling of the throat, follow up for reevaluation.   If symptoms not improving in 4 days, can fill Augmentin for sinus infection.

## 2017-08-03 ENCOUNTER — Ambulatory Visit (INDEPENDENT_AMBULATORY_CARE_PROVIDER_SITE_OTHER): Payer: 59 | Admitting: Medical

## 2017-08-03 ENCOUNTER — Encounter: Payer: Self-pay | Admitting: Family Medicine

## 2017-08-03 ENCOUNTER — Ambulatory Visit
Admission: RE | Admit: 2017-08-03 | Discharge: 2017-08-03 | Disposition: A | Discharge status: Home / Self Care | Payer: 59 | Source: Ambulatory Visit | Attending: Medical | Admitting: Medical

## 2017-08-03 VITALS — BP 130/80 | HR 83 | Wt 171.2 lb

## 2017-08-03 DIAGNOSIS — M25511 Pain in right shoulder: Principal | ICD-10-CM

## 2017-08-03 DIAGNOSIS — G8929 Other chronic pain: Secondary | ICD-10-CM

## 2017-08-03 MED ORDER — NAPROXEN 500 MG PO TABS: 500.0000 mg | ORAL_TABLET | Freq: Two times a day (BID) | ORAL | 0 refills | Status: DC

## 2017-08-03 MED ORDER — HYDROCODONE-ACETAMINOPHEN 5-325 MG PO TABS: 1.0000 | ORAL_TABLET | Freq: Four times a day (QID) | ORAL | 0 refills | Status: DC | PRN

## 2017-08-03 NOTE — Progress Notes (Signed)
Subjective: Chief Complaint  Patient presents with  . right shoulder    right shoulder pain- always hurting and pain runs down arm to fingers   Has been having problems with right shoulder for months, but last 3 weeks throbbing all the time.   Right handed, but is somewhat ambidextrous.   Has night time pain as well.   Lying on right side hurts a lot.  Pain in front of shoulder lateral shoulder and posterior shoulder.  Pinky and ring fingers sometimes tingle.   No specific injury, trauma or fall.   No other aggravating or relieving factors. No other complaint.  Past Medical History:  Diagnosis Date  . Elevated blood pressure   . Hyperlipidemia   . Impaired fasting blood sugar   . Smoking   . Spontaneous pneumothorax    3 times, 2009, 2007, 2005   Current Outpatient Medications on File Prior to Visit  Medication Sig Dispense Refill  . fluticasone (FLONASE) 50 MCG/ACT nasal spray Place 2 sprays into both nostrils daily. 1 g 0   No current facility-administered medications on file prior to visit.    ROS as in subjective   Objective: BP 130/80   Pulse 83   Wt 171 lb 3.2 oz (77.7 kg)   BMI 27.22 kg/m   Gen: wd, wn, nad Tender along right shoulder in general, pain with neers and hawkins, mild pain with crossover, pain with external ROM, pain with empty can test, no obvious swelling, no obvious laxity.  There is some pain with labral test.  otherwise no deformity, no other tenderness.    Internal and external ROM is somewhat reduced as well.  Rest of arm non tender.  Arms otherwise neurovascularly intact Neck supple, non tender, no mass, no thyromegaly or lymphadenopathy Back and chest otherwise nontender    Assessment: Encounter Diagnosis  Name Primary?  . Chronic right shoulder pain Yes    Plan: We discussed symptoms, possible causes.  I suspect RTC inflammation, but discussed other differential.    Go for xray, discussed recommendations as below.  Patient Instructions   Recommendations  Rest the arm when possible  Use arm sling when possible  Use bag of frozen peas to ice the shoulder 20 minutes 2-3 times daily this week  Use Ibuprofen 200mg , 4 tablets three times daily for the next 5-7 days  Use the Hydrocodone pain medications, 1/2-1 tablet up to twice daily if needed for worse pain  Go for xray   [name] was seen today for right shoulder.  Diagnoses and all orders for this visit:  Chronic right shoulder pain -     DG Shoulder Right; Future  Other orders -     Discontinue: naproxen (NAPROSYN) 500 MG tablet; Take 1 tablet (500 mg total) by mouth 2 (two) times daily with a meal. -     HYDROcodone-acetaminophen (NORCO) 5-325 MG tablet; Take 1 tablet by mouth every 6 (six) hours as needed for moderate pain. -     naproxen (NAPROSYN) 500 MG tablet; Take 1 tablet (500 mg total) by mouth 2 (two) times daily with a meal.

## 2017-08-03 NOTE — Patient Instructions (Addendum)
Recommendations  Rest the arm when possible  Use arm sling when possible  Use bag of frozen peas to ice the shoulder 20 minutes 2-3 times daily this week  Use Ibuprofen 200mg , 4 tablets three times daily for the next 5-7 days  Use the Hydrocodone pain medications, 1/2-1 tablet up to twice daily if needed for worse pain  Go for xray

## 2017-08-11 ENCOUNTER — Telehealth: Payer: Self-pay | Admitting: Family Medicine

## 2017-08-11 ENCOUNTER — Other Ambulatory Visit: Payer: Self-pay | Admitting: Medical

## 2017-08-11 MED ORDER — HYDROCODONE-ACETAMINOPHEN 5-325 MG PO TABS: 1.0000 | ORAL_TABLET | Freq: Four times a day (QID) | ORAL | 0 refills | Status: DC | PRN

## 2017-08-11 NOTE — Telephone Encounter (Signed)
If not better at all, the we may want to move forward with MRI to better view the shoulder, or have him return for recheck.  See what he wants to do.  Me sent.

## 2017-08-11 NOTE — Telephone Encounter (Signed)
Pt wants refill on Hydrocodone.  He states he is no better.  If he takes 800 ibuprofen and ice seems to help.  What is the plan for his shoulder?  Please advise [phone]

## 2017-08-12 NOTE — Telephone Encounter (Signed)
Called pt he is coming in to see you on Tuesday for an appt. Her said that it was getting better , but has starting get worse again.

## 2017-08-16 ENCOUNTER — Encounter: Payer: Self-pay | Admitting: Medical

## 2017-08-16 ENCOUNTER — Telehealth: Payer: Self-pay | Admitting: Medical

## 2017-08-16 ENCOUNTER — Ambulatory Visit (INDEPENDENT_AMBULATORY_CARE_PROVIDER_SITE_OTHER): Payer: 59 | Admitting: Medical

## 2017-08-16 VITALS — BP 130/78 | HR 90 | Wt 168.8 lb

## 2017-08-16 DIAGNOSIS — M79601 Pain in right arm: Secondary | ICD-10-CM

## 2017-08-16 DIAGNOSIS — M25511 Pain in right shoulder: Secondary | ICD-10-CM | POA: Diagnosis not present

## 2017-08-16 DIAGNOSIS — M792 Neuralgia and neuritis, unspecified: Secondary | ICD-10-CM | POA: Diagnosis not present

## 2017-08-16 DIAGNOSIS — G8929 Other chronic pain: Secondary | ICD-10-CM | POA: Diagnosis not present

## 2017-08-16 DIAGNOSIS — M542 Cervicalgia: Secondary | ICD-10-CM

## 2017-08-16 MED ORDER — PREDNISONE 10 MG PO TABS: ORAL_TABLET | ORAL | 0 refills | Status: DC

## 2017-08-16 MED ORDER — CYCLOBENZAPRINE HCL 10 MG PO TABS: ORAL_TABLET | ORAL | 0 refills | Status: DC

## 2017-08-16 NOTE — Progress Notes (Signed)
Subjective: Chief Complaint  Patient presents with  . shoulder pain    discuss shoulder pain , hasn't gotten any better   Here for recheck on right shoulder pain.  I saw him on 08/03/17 for same.  Since last visit has had minimal improvement despite the medications, arm sling, rest, etc.   His recent normal right shoulder xray was reviewed.  He has been having problems with right shoulder for months, but last 3- 4 weeks throbbing all the time.   Right handed, but is somewhat ambidextrous.   Has night time pain as well.   Lying on right side hurts a lot.  Pain in front of shoulder lateral shoulder and posterior shoulder.  Pinky and ring fingers sometimes tingle.   He notes today pain in right elbow, pain in medial right elbow and posterior elbow, whole arm seems sensitive.   He notes that was bothering him last visit as well but those symptoms and generalized arm pain has worsened.  No specific injury, trauma or fall.  No other aggravating or relieving factors. No other complaint.  Past Medical History:  Diagnosis Date  . Elevated blood pressure   . Hyperlipidemia   . Impaired fasting blood sugar   . Smoking   . Spontaneous pneumothorax    3 times, 2009, 2007, 2005   Current Outpatient Medications on File Prior to Visit  Medication Sig Dispense Refill  . HYDROcodone-acetaminophen (NORCO) 5-325 MG tablet Take 1 tablet by mouth every 6 (six) hours as needed for moderate pain. 15 tablet 0  . fluticasone (FLONASE) 50 MCG/ACT nasal spray Place 2 sprays into both nostrils daily. (Patient not taking: Reported on 08/16/2017) 1 g 0   No current facility-administered medications on file prior to visit.    ROS as in subjective   Objective: BP 130/78   Pulse 90   Wt 168 lb 12.8 oz (76.6 kg)   SpO2 98%   BMI 26.84 kg/m   Gen: wd, wn, nad Tender along right shoulder in general, mild pain with crossover, pain with external ROM, pain with empty can test, no obvious swelling, no obvious laxity.    otherwise no deformity, no other tenderness.  His range of motion of the shoulder is pretty good except for mildly reduced internal and external range of motion.  Tender along right medial elbow and olecranon, mild tenderness of right forearm approximately, but no deformity.  Arms otherwise neurovascularly intact Neck: Slight reduction in neck rotation but otherwise range of motion pretty full, supple, non tender, no mass, no thyromegaly or lymphadenopathy Back and chest otherwise nontender    Assessment: Encounter Diagnoses  Name Primary?  . Chronic right shoulder pain Yes  . Right arm pain   . Neck pain   . Radicular pain in right arm     Plan: We reviewed his x-ray from last visit which is normal right shoulder.  Last visit he mainly was focused on shoulder pain and was mainly tender in the shoulder but today he seems guarded and quite tender in the right arm in general particular around the elbow and shoulder.  He seems to certainly have shoulder pain but he also has symptoms that could suggest radicular neck pain on the right as well.  We will try a round of prednisone, Flexeril as needed, continue Norco for worse pain as needed.  Cautioned about sedation of his medications.  We will go ahead and refer to orthopedist for additional evaluation and recommendations.  His symptoms and exam  last visit suggested more of a rotator cuff issue and some tendinitis, but some of his symptoms suggest possible radicular component too.  I still advised relative rest, using arm sling for an hour or 2 throughout the day with periods of moving the arm in some gentle range of motion, can continue ice as needed.  There are no Patient Instructions on file for this visit. Cristian Walker was seen today for shoulder pain.  Diagnoses and all orders for this visit:  Chronic right shoulder pain -     AMB referral to orthopedics  Right arm pain -     AMB referral to orthopedics  Neck pain -     AMB referral to  orthopedics  Radicular pain in right arm -     AMB referral to orthopedics  Other orders -     predniSONE (DELTASONE) 10 MG tablet; 6/5/4/3/2/1 taper -     cyclobenzaprine (FLEXERIL) 10 MG tablet; 1/2- 1 tablet po QHS or up to BID prn

## 2017-08-16 NOTE — Telephone Encounter (Signed)
Please refer to ortho, [facility] for right shoulder pain, right arm pain, radicular pain.   Send copy of office note and xray report if not in Epic

## 2017-08-17 NOTE — Telephone Encounter (Signed)
[facility]

## 2017-08-18 ENCOUNTER — Telehealth: Payer: Self-pay

## 2017-08-18 NOTE — Telephone Encounter (Signed)
Pt called to let us know he has appt with [facility] and pt is requesting a refill on hydrocodone . Please advise [facility]

## 2017-08-19 ENCOUNTER — Other Ambulatory Visit: Payer: Self-pay | Admitting: Medical

## 2017-08-19 MED ORDER — HYDROCODONE-ACETAMINOPHEN 5-325 MG PO TABS: 1.0000 | ORAL_TABLET | Freq: Four times a day (QID) | ORAL | 0 refills | Status: DC | PRN

## 2017-08-23 NOTE — Telephone Encounter (Signed)
Was sent in by shane on 08-19-17

## 2017-08-25 ENCOUNTER — Ambulatory Visit (INDEPENDENT_AMBULATORY_CARE_PROVIDER_SITE_OTHER): Payer: 59 | Admitting: Surgery

## 2017-08-25 ENCOUNTER — Ambulatory Visit (INDEPENDENT_AMBULATORY_CARE_PROVIDER_SITE_OTHER): Payer: 59

## 2017-08-25 ENCOUNTER — Encounter (INDEPENDENT_AMBULATORY_CARE_PROVIDER_SITE_OTHER): Payer: Self-pay | Admitting: Surgery

## 2017-08-25 DIAGNOSIS — M7541 Impingement syndrome of right shoulder: Secondary | ICD-10-CM | POA: Diagnosis not present

## 2017-08-25 DIAGNOSIS — M542 Cervicalgia: Secondary | ICD-10-CM

## 2017-08-25 DIAGNOSIS — G5621 Lesion of ulnar nerve, right upper limb: Secondary | ICD-10-CM

## 2017-08-25 MED ORDER — METHYLPREDNISOLONE ACETATE 40 MG/ML IJ SUSP
40.0000 mg | INTRAMUSCULAR | Status: AC | PRN
  Administered 2017-08-25: 40 mg via INTRA_ARTICULAR

## 2017-08-25 MED ORDER — BUPIVACAINE HCL 0.25 % IJ SOLN
4.0000 mL | INTRAMUSCULAR | Status: AC | PRN
  Administered 2017-08-25: 4 mL via INTRA_ARTICULAR

## 2017-08-25 MED ORDER — LIDOCAINE HCL 1 % IJ SOLN
3.0000 mL | INTRAMUSCULAR | Status: AC | PRN
  Administered 2017-08-25: 3 mL

## 2017-08-25 NOTE — Progress Notes (Signed)
Office Visit Note   Patient: Cristian Walker           Date of Birth: [birth year]           MRN: [id number] Visit Date: 08/25/2017              Requested by: Jac Canavan, PA-C [address] [place], [place] [address] PCP: Jac Canavan, PA-C   Assessment & Plan: Visit Diagnoses:  1. Neck pain   2. Impingement syndrome of right shoulder   3. Ulnar neuropathy at elbow, right     Plan: To help sort out patient's pain I offered diagnostic/therapeutic right shoulder subacromial Marcaine/Depo-Medrol injection.  Patient sent right posterior shoulder was prepped with Betadine and injection was performed.  After sitting for a few minutes patient did report good relief of his shoulder pain with Marcaine in place.  I do believe the intermittent numbness and tingling radiating from his medial right elbow to the ulnar aspect of his hand is related to ulnar neuropathy.  Advised him to try to avoid putting direct pressure on to the medial elbow to see if this helps his symptoms.  If symptoms from his elbow persists we will consider getting NCV/EMG study.  Follow-up me in 3 weeks for recheck.  I did advise him to contact me in about 1-2 weeks to let me know how his shoulder is feeling.  If his pain returns I will plan to schedule MRI right shoulder to better evaluate his rotator cuff and rule out other shoulder pathology.  Follow-Up Instructions: Return in about 3 weeks (around 09/15/2017) for With [name] for recheck right shoulder and right elbow.   Orders:  Orders Placed This Encounter  Procedures  . XR Cervical Spine 2 or 3 views   No orders of the defined types were placed in this encounter.     Procedures: Large Joint Inj: R subacromial bursa on 08/25/2017 3:01 PM Indications: pain Details: 25 G 1.5 in needle, posterior approach Medications: 3 mL lidocaine 1 %; 4 mL bupivacaine 0.25 %; 40 mg methylPREDNISolone acetate 40 MG/ML Consent was given by the patient. Patient was prepped  and draped in the usual sterile fashion.       Clinical Data: No additional findings.   Subjective: Chief Complaint  Patient presents with  . Right Shoulder - Pain, Numbness    HPI 37 year old white male being seen at the request of primary care PA [name] for ongoing right shoulder pain and arm numbness and tingling.  Right shoulder pain ongoing times several months or worse the last several weeks.  No specific injury that he can recall.  Pain more aggravated with internal rotation behind his back.  Also bothers him when he lays on his right shoulder at night.  He describes having medial elbow pain with numbness and tingling from the elbow radiating to the ulnar aspect of his right hand.  States that this is not frequent and is aggravated when he rests his arm on the center console in his vehicle or if he bumps his elbow.  PCP has conservatively treated with prednisone taper, muscle relaxer, hydrocodone, sling without any improvement. Review of Systems No current cardiac pulmonary GI GU issues  Objective: Vital Signs: There were no vitals taken for this visit.  Physical Exam  Constitutional: He is oriented to person, place, and time. He appears well-developed. No distress.  HENT:  Head: Normocephalic.  Neck: Normal range of motion.  Patient has mild right brachial plexus  tenderness.  Negative Spurling test.  Pulmonary/Chest: He has no wheezes.  Abdominal: He exhibits no distension.  Musculoskeletal:  Right shoulder he has good range of motion but with discomfort reaching overhead and internal rotation behind his back.  Positive impingement test.  Drop arm test.  He is moderately tender anterior right shoulder along the biceps tendon.  No palpable tendon defect.  Good biceps and cuff strength.  Right elbow good range of motion.  Is mildly tender over the medial epicondyle.  The ulnar nerve is also tender to palpation.  Positive Tinel's over the right cubital tunnel.  Negative  elbow flexion test.  Left elbow unremarkable.  Neurovascular intact.  Neurological: He is alert and oriented to person, place, and time.    Ortho Exam  Specialty Comments:  No specialty comments available.  Imaging: Xr Cervical Spine 2 Or 3 Views  Result Date: 08/25/2017 X-ray cervical spine show multilevel degenerative disc disease.  No acute findings.  No listhesis.    PMFS History: Patient Active Problem List   Diagnosis Date Noted  . Radicular pain in right arm 08/16/2017  . Neck pain 08/16/2017  . Right arm pain 08/16/2017  . Chronic right shoulder pain 08/16/2017  . Acute non-recurrent frontal sinusitis 03/29/2017  . Cough 03/29/2017  . Encounter for health maintenance examination in adult 03/04/2016  . History of pneumothorax 03/04/2016  . Elevated blood pressure reading without diagnosis of hypertension 03/04/2016  . Mixed dyslipidemia 03/04/2016   Past Medical History:  Diagnosis Date  . Elevated blood pressure   . Hyperlipidemia   . Impaired fasting blood sugar   . Smoking   . Spontaneous pneumothorax    3 times, 2009, 2007, 2005    Family History  Problem Relation Age of Onset  . Hypertension Mother   . Heart disease Father        stent  . Alcohol abuse Father   . Cancer Maternal Grandfather        lung, asbestos    Past Surgical History:  Procedure Laterality Date  . LUNG SURGERY  2009, 2007, 2005   spontaneous pneumothorax x 3 surgeries   Social History   Occupational History  . Not on file  Tobacco Use  . Smoking status: Former Smoker    Years: 0.50  . Smokeless tobacco: Never Used  Substance and Sexual Activity  . Alcohol use: No  . Drug use: No  . Sexual activity: Yes    Partners: Female

## 2017-08-26 ENCOUNTER — Encounter (INDEPENDENT_AMBULATORY_CARE_PROVIDER_SITE_OTHER): Payer: Self-pay | Admitting: Orthopaedic Surgery

## 2017-08-26 ENCOUNTER — Ambulatory Visit (INDEPENDENT_AMBULATORY_CARE_PROVIDER_SITE_OTHER): Payer: 59 | Admitting: Orthopaedic Surgery

## 2017-08-26 VITALS — BP 152/95 | HR 103

## 2017-08-26 DIAGNOSIS — M5412 Radiculopathy, cervical region: Secondary | ICD-10-CM | POA: Diagnosis not present

## 2017-08-26 MED ORDER — HYDROCODONE-ACETAMINOPHEN 5-325 MG PO TABS: 1.0000 | ORAL_TABLET | Freq: Two times a day (BID) | ORAL | 0 refills | Status: DC | PRN

## 2017-08-29 ENCOUNTER — Encounter (INDEPENDENT_AMBULATORY_CARE_PROVIDER_SITE_OTHER): Payer: Self-pay | Admitting: Orthopaedic Surgery

## 2017-08-29 NOTE — Progress Notes (Addendum)
Office Visit Note   Patient: Cristian Walker           Date of Birth: [birth year]           MRN: [id number] Visit Date: 08/26/2017              Requested by: Jac Canavan, PA-C [address] [place], [place] [address] PCP: Jac Canavan, PA-C   Assessment & Plan: Visit Diagnoses:  1. Radiculopathy, cervical region     Plan: Hydrocodone 5/325 1 p.o. every 12 hours as needed for pain.  Has symptoms on the right.  Follow-Up Instructions: No Follow-up on file.   Orders:  Orders Placed This Encounter  Procedures  . MR Cervical Spine w/o contrast   Meds ordered this encounter  Medications  . HYDROcodone-acetaminophen (NORCO) 5-325 MG tablet    Sig: Take 1 tablet by mouth every 12 (twelve) hours as needed for moderate pain.    Dispense:  20 tablet    Refill:  0      Procedures: No procedures performed   Clinical Data: No additional findings.   Subjective: Chief Complaint  Patient presents with  . Right Shoulder - Pain    HPI 37 year old male seen yesterday Cristian Walker and had a right shoulder injection.  He states last night pain was severe and continues to radiate down his arm into the forearm and also sometimes into the ring and small finger.  Over the cubital tunnel, took Flexeril, ibuprofen it did not get relief.  He is severe and he is miserable.  His wife Cristian Walker is present with him today.  He continues to have pain when he turns his neck particularly if he tilts his neck shoulder and down his arm.  Continues to have shoulder pain when he reaches with his hand back to the posterior axillary line on the right.  No left upper extremity symptoms.  He has problems when he is riding in a vehicle and has to rest his arm on the console has trouble sleeping.  He has been treated with muscle relaxants, hydrocodone, sling prednisone taper.  Review of Systems unchanged from yesterday's office visit other than as mentioned in HPI.  Of note is his past history of  spontaneous pneumothorax treated with chest tube 2017.   Objective: Vital Signs: BP (!) 152/95   Pulse (!) 103   Physical Exam  Constitutional: He is oriented to person, place, and time. He appears well-developed and well-nourished.  HENT:  Head: Normocephalic and atraumatic.  Eyes: EOM are normal. Pupils are equal, round, and reactive to light.  Neck: No tracheal deviation present. No thyromegaly present.  Cardiovascular: Normal rate.  Pulmonary/Chest: Effort normal. He has no wheezes.  Abdominal: Soft. Bowel sounds are normal.  Neurological: He is alert and oriented to person, place, and time.  Skin: Skin is warm and dry. Capillary refill takes less than 2 seconds.  Psychiatric: He has a normal mood and affect. His behavior is normal. Judgment and thought content normal.    Ortho Exam patient has negative drop arm test.  Tenderness over the cubital tunnel on the right.  Positive Tinel's over the cubital tunnel.  He has extreme brachial plexus tenderness on the right none on the left.  Negative Lhermitte test.  Right triceps weakness one half grade versus normal left.  He is right-hand dominant.  No weakness with wrist flexion or finger extension.  Interossei are strong.  Ulnar nerve over Guyon's canal is normal.  Fundi ring  and small finger and other fingers are all normal.  Positive Spurling on the right negative on the left. Specialty Comments:  No specialty comments available.  Imaging: Previous flexion-extension C-spine x-rays demonstrate mild spurring endplate C5-6 C4-5 without listhesis on flexion extension.   PMFS History: Patient Active Problem List   Diagnosis Date Noted  . Radicular pain in right arm 08/16/2017  . Neck pain 08/16/2017  . Right arm pain 08/16/2017  . Chronic right shoulder pain 08/16/2017  . Acute non-recurrent frontal sinusitis 03/29/2017  . Cough 03/29/2017  . Encounter for health maintenance examination in adult 03/04/2016  . History of  pneumothorax 03/04/2016  . Elevated blood pressure reading without diagnosis of hypertension 03/04/2016  . Mixed dyslipidemia 03/04/2016   Past Medical History:  Diagnosis Date  . Elevated blood pressure   . Hyperlipidemia   . Impaired fasting blood sugar   . Smoking   . Spontaneous pneumothorax    3 times, 2009, 2007, 2005    Family History  Problem Relation Age of Onset  . Hypertension Mother   . Heart disease Father        stent  . Alcohol abuse Father   . Cancer Maternal Grandfather        lung, asbestos    Past Surgical History:  Procedure Laterality Date  . LUNG SURGERY  2009, 2007, 2005   spontaneous pneumothorax x 3 surgeries   Social History   Occupational History  . Not on file  Tobacco Use  . Smoking status: Former Smoker    Years: 0.50  . Smokeless tobacco: Never Used  Substance and Sexual Activity  . Alcohol use: No  . Drug use: No  . Sexual activity: Yes    Partners: Female

## 2017-09-02 ENCOUNTER — Telehealth (INDEPENDENT_AMBULATORY_CARE_PROVIDER_SITE_OTHER): Payer: Self-pay | Admitting: Orthopaedic Surgery

## 2017-09-02 MED ORDER — HYDROCODONE-ACETAMINOPHEN 5-325 MG PO TABS: 1.0000 | ORAL_TABLET | Freq: Two times a day (BID) | ORAL | 0 refills | Status: DC | PRN

## 2017-09-02 NOTE — Telephone Encounter (Signed)
Please see below and advise. OK for refill on Hydrocodone? Patient scheduled for MRI Cspine on 09/04/17.

## 2017-09-02 NOTE — Telephone Encounter (Signed)
Is he requesting a refill? 

## 2017-09-02 NOTE — Telephone Encounter (Signed)
Ok thanks 

## 2017-09-02 NOTE — Telephone Encounter (Signed)
Hydrocodone

## 2017-09-02 NOTE — Telephone Encounter (Signed)
Script printed. I called patient and advised.

## 2017-09-02 NOTE — Telephone Encounter (Signed)
Yes I"m sorry I  Was completing  Multiple task at one time

## 2017-09-04 ENCOUNTER — Ambulatory Visit
Admission: RE | Admit: 2017-09-04 | Discharge: 2017-09-04 | Disposition: A | Discharge status: Home / Self Care | Payer: 59 | Source: Ambulatory Visit | Attending: Orthopaedic Surgery | Admitting: Orthopaedic Surgery

## 2017-09-04 DIAGNOSIS — M5412 Radiculopathy, cervical region: Secondary | ICD-10-CM

## 2017-09-04 DIAGNOSIS — M4802 Spinal stenosis, cervical region: Secondary | ICD-10-CM | POA: Diagnosis not present

## 2017-09-13 ENCOUNTER — Ambulatory Visit (INDEPENDENT_AMBULATORY_CARE_PROVIDER_SITE_OTHER): Payer: 59 | Admitting: Orthopaedic Surgery

## 2017-09-13 ENCOUNTER — Encounter (INDEPENDENT_AMBULATORY_CARE_PROVIDER_SITE_OTHER): Payer: Self-pay | Admitting: Orthopaedic Surgery

## 2017-09-13 ENCOUNTER — Telehealth (INDEPENDENT_AMBULATORY_CARE_PROVIDER_SITE_OTHER): Payer: Self-pay | Admitting: Orthopaedic Surgery

## 2017-09-13 VITALS — BP 145/101 | HR 90 | Ht 67.0 in | Wt 175.0 lb

## 2017-09-13 DIAGNOSIS — M5020 Other cervical disc displacement, unspecified cervical region: Secondary | ICD-10-CM

## 2017-09-13 NOTE — Telephone Encounter (Signed)
Patient called back wanting to know if there was any way he could be seen sooner for the nerve conduction test? CB # [phone]

## 2017-09-13 NOTE — Progress Notes (Signed)
Office Visit Note   Patient: Cristian Walker           Date of Birth: 04/18/1981           MRN: [id number] Visit Date: 09/13/2017              Requested by: Jac Canavanysinger, David S, PA-C [address] [place], [place] [id number] PCP: Jac Canavanysinger, David S, PA-C   Assessment & Plan: Visit Diagnoses:  1. Protrusion of cervical intervertebral disc     Plan: Patient's MRI scan was reviewed with him I gave him a copy of the report.  He has disc protrusion which is moderate on the left at C4-5 but is primarily having pain symptoms on the right which tends to radiate to the cubital tunnel.  I would recommend some EMGs nerve conduction velocities for evaluation and he can return after the scan.  He still working as he was doing I discussed them I recommend avoiding the narcotic pain medication until we can sort this out.  Office follow-up after electrical tests.  Follow-Up Instructions: Follow-up after EMGs nerve conduction velocities.  Orders:  No orders of the defined types were placed in this encounter.  No orders of the defined types were placed in this encounter.     Procedures: No procedures performed   Clinical Data: No additional findings.   Subjective: Chief Complaint  Patient presents with  . Neck - Pain, Follow-up    MRI review    HPI 37 year old male returns with ongoing problems with pain along his right clavicle pain that radiates down to his right arm with tenderness over the cubital tunnel.  He has some pain laterally on the left near the deltoid but most has been right-sided.  This is been going on for several months and cervical MRI scan has been obtained and is available for review.  Review of Systems  14 point review of systems updated past history of spontaneous pneumothorax treated with chest tube 2017 with left pneumothorax followed by right pneumothorax later.  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  Positive for right neck pain, elevated blood pressure  history of cough.  Otherwise negative,  As it pertains to HPI.  Objective: Vital Signs: BP (!) 145/101   Pulse 90   Ht 5\' 7"  (1.702 m)   Wt 175 lb (79.4 kg)   BMI 27.41 kg/m   Physical Exam  Constitutional: He is oriented to person, place, and time. He appears well-developed and well-nourished.  HENT:  Head: Normocephalic and atraumatic.  Eyes: Pupils are equal, round, and reactive to light. EOM are normal.  Neck: No tracheal deviation present. No thyromegaly present.  Cardiovascular: Normal rate.  Pulmonary/Chest: Effort normal. He has no wheezes.  Abdominal: Soft. Bowel sounds are normal.  Neurological: He is alert and oriented to person, place, and time.  Skin: Skin is warm and dry. Capillary refill takes less than 2 seconds.  Psychiatric: He has a normal mood and affect. His behavior is normal. Judgment and thought content normal.    Ortho Exam patient has positive Tinel's over the cubital tunnel on the right.  This radiates into the hyperthenar region and also small finger.  No triceps weakness is noted today.  He has brachial plexus tenderness some right and some left.  He can flex chin within 1 fingerbreadth chin to chest normal extension.  First dorsal interosseous and abductor to the fifth finger are normal to resisted testing.  Normal wrist flexion and extension  strength.  Reflexes are 1+ and symmetrical.  Specialty Comments:  No specialty comments available.  Imaging: CLINICAL DATA:  Initial evaluation for right clavicular pain radiating into the right upper extremity for 4 months.  EXAM: MRI CERVICAL SPINE WITHOUT CONTRAST  TECHNIQUE: Multiplanar, multisequence MR imaging of the cervical spine was performed. No intravenous contrast was administered.  COMPARISON:  Prior radiographs from 08/25/2017.  FINDINGS: Alignment: Mild straightening of the normal cervical lordosis. No listhesis.  Vertebrae: Vertebral body heights well maintained without  evidence for acute or chronic fracture. Bone marrow signal intensity within normal limits. No discrete or worrisome osseous lesions. No abnormal marrow edema.  Cord: Signal intensity within the cervical spinal cord is normal.  Posterior Fossa, vertebral arteries, paraspinal tissues: Visualized brain and posterior fossa within normal limits. Craniocervical junction normal. Paraspinous and prevertebral soft tissues within normal limits. Normal intravascular flow voids present within the vertebral arteries bilaterally.  Disc levels:  C2-C3: Unremarkable.  C3-C4:  Unremarkable.  C4-C5: Left paracentral/foraminal disc osteophyte complex indents the left ventral thecal sac. Minimal flattening of the left ventral hemi cord without cord signal changes. Left C5 nerve root could potentially be affected. Moderate left foraminal stenosis. Mild right-sided uncovertebral hypertrophy with minimal flattening of the right ventral CSF and borderline mild right foraminal stenosis.  C5-C6:  Minimal annular disc bulge.  No canal or foraminal stenosis.  C6-C7:  Minimal uncovertebral hypertrophy.  No stenosis.  C7-T1:  Unremarkable.  Visualized upper thoracic spine demonstrates no significant findings.  IMPRESSION: 1. Left paracentral/foraminal disc osteophyte complex at C4-5 with resultant moderate left C5 foraminal stenosis. 2. Additional minor degenerative changes as above. No findings to explain patient's right-sided symptoms identified.   Electronically Signed   By: Rise Mu M.D.   On: 09/05/2017 04:52   PMFS History: Patient Active Problem List   Diagnosis Date Noted  . Radicular pain in right arm 08/16/2017  . Neck pain 08/16/2017  . Right arm pain 08/16/2017  . Chronic right shoulder pain 08/16/2017  . Acute non-recurrent frontal sinusitis 03/29/2017  . Cough 03/29/2017  . Encounter for health maintenance examination in adult 03/04/2016  . History of  pneumothorax 03/04/2016  . Elevated blood pressure reading without diagnosis of hypertension 03/04/2016  . Mixed dyslipidemia 03/04/2016   Past Medical History:  Diagnosis Date  . Elevated blood pressure   . Hyperlipidemia   . Impaired fasting blood sugar   . Smoking   . Spontaneous pneumothorax    3 times, 2009, 2007, 2005    Family History  Problem Relation Age of Onset  . Hypertension Mother   . Heart disease Father        stent  . Alcohol abuse Father   . Cancer Maternal Grandfather        lung, asbestos    Past Surgical History:  Procedure Laterality Date  . LUNG SURGERY  2009, 2007, 2005   spontaneous pneumothorax x 3 surgeries   Social History   Occupational History  . Not on file  Tobacco Use  . Smoking status: Former Smoker    Years: 0.50  . Smokeless tobacco: Never Used  Substance and Sexual Activity  . Alcohol use: No  . Drug use: No  . Sexual activity: Yes    Partners: Female

## 2017-09-13 NOTE — Addendum Note (Signed)
Addended by: Rogers SeedsYEATTS, Dennette Faulconer M on: 09/13/2017 11:22 AM   Modules accepted: Orders

## 2017-09-14 ENCOUNTER — Telehealth (INDEPENDENT_AMBULATORY_CARE_PROVIDER_SITE_OTHER): Payer: Self-pay | Admitting: *Deleted

## 2017-09-14 DIAGNOSIS — M5020 Other cervical disc displacement, unspecified cervical region: Secondary | ICD-10-CM

## 2017-09-14 NOTE — Telephone Encounter (Signed)
Please advise 

## 2017-09-14 NOTE — Telephone Encounter (Signed)
Sorry. Cristian PlunkBest to avoid narcotics in case he needs surgery then pain med would work better. See OV note. I discussed with him at the visit.

## 2017-09-14 NOTE — Telephone Encounter (Signed)
Called pt and informed that he will be added to the wait list if anyone cancels. Pt wanted something for pain until his appt date, sent message to Dr. Ophelia CharterYates assistant.

## 2017-09-15 ENCOUNTER — Ambulatory Visit (INDEPENDENT_AMBULATORY_CARE_PROVIDER_SITE_OTHER): Payer: 59 | Admitting: Surgery

## 2017-09-15 NOTE — Telephone Encounter (Signed)
Is there anything that you can recommend for patient until follow up? Patient is scheduled for EMG/NCV on 10/14/17.  He states pain is unbearable.

## 2017-09-15 NOTE — Telephone Encounter (Signed)
Can you call patient back about this? Cristian Walker was unable to work patient in sooner. Patient is in a lot of pain and needs something for the pain. # [phone]

## 2017-09-15 NOTE — Telephone Encounter (Signed)
Home cervical traction. ucall

## 2017-09-16 ENCOUNTER — Telehealth (INDEPENDENT_AMBULATORY_CARE_PROVIDER_SITE_OTHER): Payer: Self-pay | Admitting: Orthopaedic Surgery

## 2017-09-16 ENCOUNTER — Encounter: Payer: Self-pay | Admitting: Neurology

## 2017-09-16 ENCOUNTER — Encounter (INDEPENDENT_AMBULATORY_CARE_PROVIDER_SITE_OTHER): Payer: Self-pay

## 2017-09-16 MED ORDER — TRAMADOL HCL 50 MG PO TABS: 50.0000 mg | ORAL_TABLET | Freq: Every evening | ORAL | 0 refills | Status: DC | PRN

## 2017-09-16 NOTE — Telephone Encounter (Signed)
I called patient and discussed. He is at work today and is trying to work through the pain. He will come in after work and get cervical traction set. I also entered EMG/NCV referral to [facility] as Dr. Alvester MorinNewton is unable to get him in sooner at this point. We will see if they are able to get him in for the test sooner.    Patient states that he really needs something to help him sleep at night. He is unable to get any sleep and that is making it very difficult for him to work everyday.  He would like to know if you would consider even Tramadol for bedtime.  He is aware that you do not want him to be on pain meds in case he has to have surgery.    Please advise on anything he can take at night until follow up.

## 2017-09-16 NOTE — Telephone Encounter (Signed)
Ok for tramadol # 15  One po q hs prn pain thanks

## 2017-09-16 NOTE — Telephone Encounter (Signed)
Noted  

## 2017-09-16 NOTE — Telephone Encounter (Signed)
Please see below. Are you able to cancel referral to Dr. Alvester MorinNewton?  I do not have the access to do that. Patient can also be removed from your schedule that day. Thanks for your help.

## 2017-09-16 NOTE — Telephone Encounter (Signed)
Tramadol called to pharmacy. I left voicemail for patient advising.  

## 2017-09-16 NOTE — Progress Notes (Signed)
Patient came by office and picked up cervical traction this afternoon. Was instructed how to use. All questions answered.

## 2017-09-16 NOTE — Telephone Encounter (Signed)
Patient wanted to let you know he was able to get a sooner appt with  for the nerve conduction study on 4/9. I moved the appt with Dr. Ophelia CharterYates for a follow up on 4/16.

## 2017-09-19 ENCOUNTER — Other Ambulatory Visit: Payer: Self-pay | Admitting: *Deleted

## 2017-09-19 DIAGNOSIS — R200 Anesthesia of skin: Secondary | ICD-10-CM

## 2017-09-20 NOTE — Telephone Encounter (Signed)
Pt appt was cancelled

## 2017-09-27 ENCOUNTER — Ambulatory Visit (INDEPENDENT_AMBULATORY_CARE_PROVIDER_SITE_OTHER): Payer: 59 | Admitting: Neurology

## 2017-09-27 DIAGNOSIS — M5412 Radiculopathy, cervical region: Secondary | ICD-10-CM

## 2017-09-27 DIAGNOSIS — R200 Anesthesia of skin: Secondary | ICD-10-CM

## 2017-09-27 DIAGNOSIS — G5621 Lesion of ulnar nerve, right upper limb: Secondary | ICD-10-CM

## 2017-09-27 NOTE — Progress Notes (Signed)
appt on 10/04/17 to review study

## 2017-09-27 NOTE — Procedures (Signed)
[facility]  [address] [place], [address]  [place], [place] [address] Tel: [phone] Fax:  [phone] Test Date:  09/27/2017  Patient: Cristian Walker DOB: 1980/12/11 Physician: Nita Sickle, DO  Sex: Male Height: 5\' 7"  Ref Phys: Veverly Fells. Ophelia Charter, MD  ID#: [id number] Temp: 34.6C Technician:    Patient Complaints: This is a 37 year old man referred for evaluation of bilateral arm pain and right hand paresthesias.  NCV & EMG Findings: Extensive electrodiagnostic testing of the right upper extremity and additional studies of the left shows:  1. Bilateral median, bilateral mixed palmer, and left ulnar sensory responses are within normal limits. Right ulnar sensory response is asymmetrically reduced as compared to the left. 2. Right ulnar motor response shows conduction velocity slowing across the elbow (A Elbow-B Elbow, 48 m/s), with normal latency and amplitude. Bilateral median and left ulnar motor responses are within normal limits. 3. Chronic motor axon loss changes are seen affecting the left biceps and deltoid muscles, without accompanied active denervation.   Impression: 1. Right ulnar neuropathy with slowing across the elbow, purely demyelinating in type; mild in degree electrically. 2. Chronic C5 radiculopathy affecting the left upper extremity; mild in degree electrically.    ___________________________ Nita Sickle, DO    Nerve Conduction Studies Anti Sensory Summary Table   Site NR Peak (ms) Norm Peak (ms) P-T Amp (V) Norm P-T Amp  Left Median Anti Sensory (2nd Digit)  34.6C  Wrist    3.0 <3.4 38.8 >20  Right Median Anti Sensory (2nd Digit)  34.6C  Wrist    2.9 <3.4 38.8 >20  Left Ulnar Anti Sensory (5th Digit)  34.6C  Wrist    2.6 <3.1 51.8 >12  Right Ulnar Anti Sensory (5th Digit)  34.6C  Wrist    2.6 <3.1 29.5 >12   Motor Summary Table   Site NR Onset (ms) Norm Onset (ms) O-P Amp (mV) Norm O-P Amp Site1 Site2 Delta-0 (ms) Dist (cm) Vel (m/s) Norm  Vel (m/s)  Left Median Motor (Abd Poll Brev)  34.6C  Wrist    2.9 <3.9 9.1 >6 Elbow Wrist 4.8 29.0 60 >50  Elbow    7.7  9.1         Right Median Motor (Abd Poll Brev)  34.6C  Wrist    2.7 <3.9 10.9 >6 Elbow Wrist 5.0 31.0 62 >50  Elbow    7.7  10.9         Left Ulnar Motor (Abd Dig Minimi)  34.6C  Wrist    2.1 <3.1 14.2 >7 B Elbow Wrist 4.3 26.0 60 >50  B Elbow    6.4  13.9  A Elbow B Elbow 2.0 10.0 50 >50  A Elbow    8.4  13.8         Right Ulnar Motor (Abd Dig Minimi)  34.6C  Wrist    2.0 <3.1 13.5 >7 B Elbow Wrist 3.8 25.5 67 >50  B Elbow    5.8  13.0  A Elbow B Elbow 2.1 10.0 48 >50  A Elbow    7.9  12.5          Comparison Summary Table   Site NR Peak (ms) Norm Peak (ms) P-T Amp (V) Site1 Site2 Delta-P (ms) Norm Delta (ms)  Left Median/Ulnar Palm Comparison (Wrist - 8cm)  34.6C  Median Palm    1.7 <2.2 66.9 Median Palm Ulnar Palm 0.3   Ulnar Palm    1.4 <2.2 16.3  Right Median/Ulnar Palm Comparison (Wrist - 8cm)  34.6C  Median Palm    1.6 <2.2 70.9 Median Palm Ulnar Palm 0.1   Ulnar Palm    1.5 <2.2 23.7       EMG   Side Muscle Ins Act Fibs Psw Fasc Number Recrt Dur Dur. Amp Amp. Poly Poly. Comment  Right 1stDorInt Nml Nml Nml Nml Nml Nml Nml Nml Nml Nml Nml Nml N/A  Right PronatorTeres Nml Nml Nml Nml Nml Nml Nml Nml Nml Nml Nml Nml N/A  Right Biceps Nml Nml Nml Nml Nml Nml Nml Nml Nml Nml Nml Nml N/A  Right Triceps Nml Nml Nml Nml Nml Nml Nml Nml Nml Nml Nml Nml N/A  Right Deltoid Nml Nml Nml Nml Nml Nml Nml Nml Nml Nml Nml Nml N/A  Right ABD Dig Min Nml Nml Nml Nml Nml Nml Nml Nml Nml Nml Nml Nml N/A  Right FlexCarpiUln Nml Nml Nml Nml Nml Nml Nml Nml Nml Nml Nml Nml N/A  Left 1stDorInt Nml Nml Nml Nml Nml Nml Nml Nml Nml Nml Nml Nml N/A  Left PronatorTeres Nml Nml Nml Nml Nml Nml Nml Nml Nml Nml Nml Nml N/A  Left Biceps Nml Nml Nml Nml 1- Rapid Few 1+ Few 1+ Nml Nml N/A  Left Triceps Nml Nml Nml Nml Nml Nml Nml Nml Nml Nml Nml Nml N/A  Left Deltoid Nml Nml  Nml Nml 1- Rapid Some 1+ Some 1+ Nml Nml N/A  Left Cervical Parasp Low Nml Nml Nml Nml Nml Nml Nml Nml Nml Nml Nml Nml N/A      Waveforms:

## 2017-10-04 ENCOUNTER — Encounter (INDEPENDENT_AMBULATORY_CARE_PROVIDER_SITE_OTHER): Payer: Self-pay | Admitting: Orthopaedic Surgery

## 2017-10-04 ENCOUNTER — Ambulatory Visit (INDEPENDENT_AMBULATORY_CARE_PROVIDER_SITE_OTHER): Payer: Self-pay | Admitting: Orthopaedic Surgery

## 2017-10-04 ENCOUNTER — Ambulatory Visit (INDEPENDENT_AMBULATORY_CARE_PROVIDER_SITE_OTHER): Payer: 59 | Admitting: Orthopaedic Surgery

## 2017-10-04 VITALS — BP 158/99 | HR 93 | Ht 67.0 in | Wt 175.0 lb

## 2017-10-04 DIAGNOSIS — G5621 Lesion of ulnar nerve, right upper limb: Secondary | ICD-10-CM | POA: Diagnosis not present

## 2017-10-04 DIAGNOSIS — M5020 Other cervical disc displacement, unspecified cervical region: Secondary | ICD-10-CM

## 2017-10-04 MED ORDER — TRAMADOL HCL 50 MG PO TABS: 50.0000 mg | ORAL_TABLET | Freq: Every evening | ORAL | 0 refills | Status: DC | PRN

## 2017-10-04 NOTE — Progress Notes (Signed)
Office Visit Note   Patient: Cristian Walker           Date of Birth: 11/01/1980           MRN: [id number] Visit Date: 10/04/2017              Requested by: Jac Canavan, PA-C [address] [place], [place] [address] PCP: Jac Canavan, PA-C   Assessment & Plan: Visit Diagnoses:  1. Cubital tunnel syndrome on right   2. Protrusion of cervical intervertebral disc             Left C4-5 with radiculopathy minimally symptomatic at this time.   Plan: Patient has 2 problems one is a cervical disc protrusion at C4-5 moderate in size and present on the left but his pain is on the right is positive changes consistent with demyelination.  Electrical test show chronic C5 radiculopathy affecting the left upper extremity is minimally symptomatic at this time.  His right elbow and ulnar nerve is giving him significant problems.  We will proceed with cubital tunnel release.  He does not have any evidence of ulnar nerve subluxation should require simple decompression.  Expected time out of work would be in the 2-3-week range and surgery would be done as an outpatient.  Ultram was refilled.  Surgical technique and procedure discussed.    He understands that his neck at some point in the future may give him symptoms.  Questions were elicited and answered.  He requests we proceed. We reviewed the nerve conduction velocities I gave him a copy of the report we also reviewed the previous MRI images and previous exam findings. Follow-Up Instructions: No follow-ups on file.   Orders:  No orders of the defined types were placed in this encounter.  Meds ordered this encounter  Medications  . traMADol (ULTRAM) 50 MG tablet    Sig: Take 1 tablet (50 mg total) by mouth at bedtime as needed.    Dispense:  15 tablet    Refill:  0      Procedures: No procedures performed   Clinical Data: No additional findings.   Subjective: Chief Complaint  Patient presents with  . Neck - Pain, Follow-up    NCS review  . Right Arm - Pain, Follow-up    HPI 37 year old male returns with ongoing problems with right arm pain at the elbow that radiates out of the hand and also up to the shoulder.  He has pain that radiates up to the brachial plexus.  Previous cervical MRI showed C4-5 moderate disc protrusion on the left he continues to have some pain that radiates to the deltoid insertion site but on the left side this is mild.  He has had persistent weakness with grip drops objects has pain that wakes him up at night with his elbows flexed position and returns after EMGs and nerve conduction velocities.  Patient has been taking tramadol for pain.  He requests a refill.  Review of Systems 14 point review of systems updated unchanged from 09/13/2017 office visit.  Of note his previous pneumothorax treated with sclerosis therapy after recurrence.  There are apical changes from previous sclerotic therapy.  Objective: Vital Signs: BP (!) 158/99   Pulse 93   Ht 5\' 7"  (1.702 m)   Wt 175 lb (79.4 kg)   BMI 27.41 kg/m   Physical Exam  Constitutional: He is oriented to person, place, and time. He appears well-developed and well-nourished.  HENT:  Head: Normocephalic and  atraumatic.  Eyes: Pupils are equal, round, and reactive to light. EOM are normal.  Neck: No tracheal deviation present. No thyromegaly present.  Cardiovascular: Normal rate.  Pulmonary/Chest: Effort normal. He has no wheezes.  Abdominal: Soft. Bowel sounds are normal.  Neurological: He is alert and oriented to person, place, and time.  Skin: Skin is warm and dry. Capillary refill takes less than 2 seconds.  Psychiatric: He has a normal mood and affect. His behavior is normal. Judgment and thought content normal.    Ortho Exam positive Tinel's over the ulnar groove.  He has some FCU weakness weakness in the ring and small per fundi.  First dorsal interosseous weakness.  No pain with carpal compression negative Phalen's test at the wrist  right and left.  Bilateral brachial plexus tenderness worse on the left than right.  Positive Spurling on the left.  Specialty Comments:  No specialty comments available.  Imaging:   Interpretation Summary   [facility]  [address], [address]  [place], [place] [address] Tel: [phone] Fax:  [phone] Test Date:  09/27/2017  Patient: Cristian Walker DOB: 05/19/1981 Physician: Nita Sickleonika Patel, DO  Sex: Male Height: 5\' 7"  Ref Phys: Veverly FellsMark C. Ophelia CharterYates, MD  ID#: [id number] Temp: 34.6C Technician:    Patient Complaints: This is a 37 year old man referred for evaluation of bilateral arm pain and right hand paresthesias.  NCV & EMG Findings: Extensive electrodiagnostic testing of the right upper extremity and additional studies of the left shows:  1. Bilateral median, bilateral mixed palmer, and left ulnar sensory responses are within normal limits. Right ulnar sensory response is asymmetrically reduced as compared to the left. 2. Right ulnar motor response shows conduction velocity slowing across the elbow (A Elbow-B Elbow, 48 m/s), with normal latency and amplitude. Bilateral median and left ulnar motor responses are within normal limits. 3. Chronic motor axon loss changes are seen affecting the left biceps and deltoid muscles, without accompanied active denervation.   Impression: 1. Right ulnar neuropathy with slowing across the elbow, purely demyelinating in type; mild in degree electrically. 2. Chronic C5 radiculopathy affecting the left upper extremity; mild in degree electrically.    ___________________________ Nita Sickleonika Patel, DO      PMFS History: Patient Active Problem List   Diagnosis Date Noted  . Radicular pain in right arm 08/16/2017  . Neck pain 08/16/2017  . Right arm pain 08/16/2017  . Chronic right shoulder pain 08/16/2017  . Acute non-recurrent frontal sinusitis 03/29/2017  . Cough 03/29/2017  . Encounter for health maintenance examination  in adult 03/04/2016  . History of pneumothorax 03/04/2016  . Elevated blood pressure reading without diagnosis of hypertension 03/04/2016  . Mixed dyslipidemia 03/04/2016   Past Medical History:  Diagnosis Date  . Elevated blood pressure   . Hyperlipidemia   . Impaired fasting blood sugar   . Smoking   . Spontaneous pneumothorax    3 times, 2009, 2007, 2005    Family History  Problem Relation Age of Onset  . Hypertension Mother   . Heart disease Father        stent  . Alcohol abuse Father   . Cancer Maternal Grandfather        lung, asbestos    Past Surgical History:  Procedure Laterality Date  . LUNG SURGERY  2009, 2007, 2005   spontaneous pneumothorax x 3 surgeries   Social History   Occupational History  . Not on file  Tobacco Use  . Smoking status: Former  Smoker    Years: 0.50  . Smokeless tobacco: Never Used  Substance and Sexual Activity  . Alcohol use: No  . Drug use: No  . Sexual activity: Yes    Partners: Female

## 2017-10-05 ENCOUNTER — Encounter (INDEPENDENT_AMBULATORY_CARE_PROVIDER_SITE_OTHER): Payer: Self-pay | Admitting: Orthopaedic Surgery

## 2017-10-05 DIAGNOSIS — G5621 Lesion of ulnar nerve, right upper limb: Secondary | ICD-10-CM | POA: Insufficient documentation

## 2017-10-05 DIAGNOSIS — M5020 Other cervical disc displacement, unspecified cervical region: Secondary | ICD-10-CM | POA: Insufficient documentation

## 2017-10-10 DIAGNOSIS — G5621 Lesion of ulnar nerve, right upper limb: Secondary | ICD-10-CM | POA: Diagnosis not present

## 2017-10-13 ENCOUNTER — Telehealth (INDEPENDENT_AMBULATORY_CARE_PROVIDER_SITE_OTHER): Payer: Self-pay | Admitting: Orthopaedic Surgery

## 2017-10-13 MED ORDER — HYDROCODONE-ACETAMINOPHEN 5-325 MG PO TABS: 1.0000 | ORAL_TABLET | Freq: Every evening | ORAL | 0 refills | Status: DC | PRN

## 2017-10-13 NOTE — Telephone Encounter (Signed)
OK for final  norco  5/325 # 10 tabs. One PO  q hs prn pain. Use tylenol / ibuprofen during daytime. Time to wean off narcotic meds.

## 2017-10-13 NOTE — Telephone Encounter (Signed)
Please advise 

## 2017-10-13 NOTE — Telephone Encounter (Signed)
Patient requesting rx refill on hydrocodone, he would like it before the weekend. Please call when ready # [phone]

## 2017-10-13 NOTE — Telephone Encounter (Signed)
Script written out in Cristian Walker office. Will call patient in the morning to advise ok to pick up and of instructions.

## 2017-10-14 ENCOUNTER — Encounter

## 2017-10-14 ENCOUNTER — Encounter (INDEPENDENT_AMBULATORY_CARE_PROVIDER_SITE_OTHER): Payer: Self-pay | Admitting: Physical Medicine and Rehabilitation

## 2017-10-14 NOTE — Telephone Encounter (Signed)
I called patient and advised. Script at front for pick up 

## 2017-10-18 ENCOUNTER — Ambulatory Visit (INDEPENDENT_AMBULATORY_CARE_PROVIDER_SITE_OTHER): Payer: Self-pay | Admitting: Orthopaedic Surgery

## 2017-10-19 ENCOUNTER — Ambulatory Visit (INDEPENDENT_AMBULATORY_CARE_PROVIDER_SITE_OTHER): Payer: 59 | Admitting: Orthopaedic Surgery

## 2017-10-19 ENCOUNTER — Encounter (INDEPENDENT_AMBULATORY_CARE_PROVIDER_SITE_OTHER): Payer: Self-pay | Admitting: Orthopaedic Surgery

## 2017-10-19 VITALS — BP 144/104 | HR 86 | Ht 67.0 in | Wt 170.0 lb

## 2017-10-19 DIAGNOSIS — G8929 Other chronic pain: Secondary | ICD-10-CM

## 2017-10-19 DIAGNOSIS — M25511 Pain in right shoulder: Secondary | ICD-10-CM

## 2017-10-19 DIAGNOSIS — G5621 Lesion of ulnar nerve, right upper limb: Secondary | ICD-10-CM

## 2017-10-19 NOTE — Progress Notes (Signed)
Post-Op Visit Note   Patient: Cristian Walker           Date of Birth: 11/18/1980           MRN: [id number] Visit Date: 10/19/2017 PCP: Jac Canavan, PA-C   Assessment & Plan: Patient returns post right cubital tunnel release.  He states from his elbow down his hands feeling much better.  Incision is well-healed.  He states he got relief from his shoulder pain right after he had the block but gradually his symptoms have returned and he states even at rest or when he had his arm in a sling he is continuing to have shoulder pain.  He has a C4-5 disc protrusion which is on the left.  Electrical test were positive for changes in the deltoid biceps on the left but he states all his pain in the shoulders principally on the right side.  He points to the distal clavicle area.  Previous multiple views of the shoulder were normal.  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  Shoulder x-ray showed the superior lung field is clear but there is some changes from the previous sclerosis of the pleural cavity.  Surgery patient states he is worried about the right shoulder pain and ability to resume work activity since he is having pain even at rest currently.  Previous EMGs nerve conduction velocities showed no changes in the deltoid biceps or triceps on the right.  It is possible that this could be related to his disc protrusion but that prints was on the left side.  He has some pain with internal rotation of the shoulder suggesting some impingement and I would recommend an MRI scan of his right shoulder due to his ongoing chronic pain that is been present for 4 months.  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  Patient had an intra-articular injection right shoulder with Marcaine and cortisone over month ago and he states that actually made the pain worse for period of time.  Office follow-up  after right shoulder MRI scan.  Chief Complaint:  Chief Complaint  Patient presents with  . Right Elbow - Routine Post Op  . Right Shoulder - Pain   Visit Diagnoses:  1. Right shoulder pain, unspecified chronicity   2. Cubital tunnel syndrome on right     Plan: Patient remains out of work.  I plan to check him back after his right shoulder MRI scan.  He can continue to work on gentle elbow range of motion.  Follow-Up Instructions: No follow-ups on file.   Orders:  No orders of the defined types were placed in this encounter.  No orders of the defined types were placed in this encounter.   Imaging: No results found.  PMFS History: Patient Active Problem List   Diagnosis Date Noted  . Cubital tunnel syndrome on right 10/05/2017  . Protrusion of cervical intervertebral disc 10/05/2017  . Radicular pain in right arm 08/16/2017  . Neck pain 08/16/2017  . Right arm pain 08/16/2017  . Chronic right shoulder pain 08/16/2017  . Acute non-recurrent frontal sinusitis 03/29/2017  . Cough 03/29/2017  . Encounter for health maintenance examination in adult 03/04/2016  . History of pneumothorax 03/04/2016  . Elevated blood pressure reading without diagnosis of hypertension 03/04/2016  . Mixed dyslipidemia 03/04/2016   Past Medical History:  Diagnosis Date  . Elevated  blood pressure   . Hyperlipidemia   . Impaired fasting blood sugar   . Smoking   . Spontaneous pneumothorax    3 times, 2009, 2007, 2005    Family History  Problem Relation Age of Onset  . Hypertension Mother   . Heart disease Father        stent  . Alcohol abuse Father   . Cancer Maternal Grandfather        lung, asbestos    Past Surgical History:  Procedure Laterality Date  . LUNG SURGERY  2009, 2007, 2005   spontaneous pneumothorax x 3 surgeries   Social History   Occupational History  . Not on file  Tobacco Use  . Smoking status: Former Smoker    Years: 0.50  . Smokeless tobacco: Never Used    Substance and Sexual Activity  . Alcohol use: No  . Drug use: No  . Sexual activity: Yes    Partners: Female

## 2017-10-19 NOTE — Addendum Note (Signed)
Addended by: Rogers Seeds on: 10/19/2017 09:44 AM   Modules accepted: Orders

## 2017-10-21 ENCOUNTER — Telehealth (INDEPENDENT_AMBULATORY_CARE_PROVIDER_SITE_OTHER): Payer: Self-pay

## 2017-10-21 ENCOUNTER — Ambulatory Visit (INDEPENDENT_AMBULATORY_CARE_PROVIDER_SITE_OTHER): Payer: 59 | Admitting: Orthopaedic Surgery

## 2017-10-21 ENCOUNTER — Encounter (INDEPENDENT_AMBULATORY_CARE_PROVIDER_SITE_OTHER): Payer: Self-pay | Admitting: Orthopaedic Surgery

## 2017-10-21 DIAGNOSIS — T8130XA Disruption of wound, unspecified, initial encounter: Secondary | ICD-10-CM

## 2017-10-21 MED ORDER — CEPHALEXIN 500 MG PO CAPS: 500.0000 mg | ORAL_CAPSULE | Freq: Four times a day (QID) | ORAL | 0 refills | Status: DC

## 2017-10-21 NOTE — Progress Notes (Signed)
   Post-Op Visit Note   Patient: Cristian Walker           Date of Birth: 1980-09-22           MRN: [id number] Visit Date: 10/21/2017 PCP: Jac Canavan, PA-C   Assessment & Plan:  Chief Complaint:  Chief Complaint  Patient presents with  . Right Elbow - Pain   Visit Diagnoses:  1. Wound dehiscence     Plan: Patient is a 37 year old gentleman who presents to our clinic today with concerns about a wound to his right elbow.  He is 11 days status post right cubital tunnel syndrome by Dr. Ophelia Charter.  He states that he fell landing on his right hand yesterday.  The distal aspect of his incision started to bleed and he noticed quite a bit of blood on his bed sheets this morning.  Examination of his right elbow reveals a 0.5 cm wound dehiscence with slight bloody drainage to the distal aspect of the incision.  No signs of infection/cellulitis.  Today, we will cover the wound with Steri-Strips and apply compressive bandage.  I do not think he needs sutures at this time.  I will also put him on antibiotics in the meantime.  He will follow-up to Dr. Kevan Ny early next week.  Call with concerns or questions in the meantime.  Follow-Up Instructions: Return in about 4 days (around 10/25/2017) for wound check with Dr. Ophelia Charter.   Orders:  No orders of the defined types were placed in this encounter.  No orders of the defined types were placed in this encounter.   Imaging: No results found.  PMFS History: Patient Active Problem List   Diagnosis Date Noted  . Wound dehiscence 10/21/2017  . Cubital tunnel syndrome on right 10/05/2017  . Protrusion of cervical intervertebral disc 10/05/2017  . Radicular pain in right arm 08/16/2017  . Neck pain 08/16/2017  . Right arm pain 08/16/2017  . Chronic right shoulder pain 08/16/2017  . Acute non-recurrent frontal sinusitis 03/29/2017  . Cough 03/29/2017  . Encounter for health maintenance examination in adult 03/04/2016  . History of pneumothorax  03/04/2016  . Elevated blood pressure reading without diagnosis of hypertension 03/04/2016  . Mixed dyslipidemia 03/04/2016   Past Medical History:  Diagnosis Date  . Elevated blood pressure   . Hyperlipidemia   . Impaired fasting blood sugar   . Smoking   . Spontaneous pneumothorax    3 times, 2009, 2007, 2005    Family History  Problem Relation Age of Onset  . Hypertension Mother   . Heart disease Father        stent  . Alcohol abuse Father   . Cancer Maternal Grandfather        lung, asbestos    Past Surgical History:  Procedure Laterality Date  . LUNG SURGERY  2009, 2007, 2005   spontaneous pneumothorax x 3 surgeries   Social History   Occupational History  . Not on file  Tobacco Use  . Smoking status: Former Smoker    Years: 0.50  . Smokeless tobacco: Never Used  Substance and Sexual Activity  . Alcohol use: No  . Drug use: No  . Sexual activity: Yes    Partners: Female

## 2017-10-21 NOTE — Telephone Encounter (Signed)
I called and spoke with patient. He states incision has been fine, but he woke up this morning and had blood on his sheets and his shirt. He thinks that the incision is opening some. I made work in appt with Dr. August Saucer for 1045.

## 2017-10-21 NOTE — Telephone Encounter (Signed)
Pt states that he is s/p elbow surgery 10/10/17. States that he must have been tossing and turning heard last night and woke up and there was blood and his bed sheets and shirt. He states that the incision is oozing a clear pink drain slight amount and does not know if it needs stitches. Says he was just in the office and everything was fine. cb # [phone]

## 2017-10-24 ENCOUNTER — Telehealth: Payer: Self-pay | Admitting: Medical

## 2017-10-24 ENCOUNTER — Ambulatory Visit (INDEPENDENT_AMBULATORY_CARE_PROVIDER_SITE_OTHER): Payer: 59 | Admitting: Orthopaedic Surgery

## 2017-10-24 ENCOUNTER — Encounter (INDEPENDENT_AMBULATORY_CARE_PROVIDER_SITE_OTHER): Payer: Self-pay | Admitting: Orthopaedic Surgery

## 2017-10-24 VITALS — BP 143/102 | HR 104 | Ht 67.0 in | Wt 170.0 lb

## 2017-10-24 DIAGNOSIS — Z9889 Other specified postprocedural states: Secondary | ICD-10-CM

## 2017-10-24 DIAGNOSIS — M5412 Radiculopathy, cervical region: Secondary | ICD-10-CM

## 2017-10-24 DIAGNOSIS — M25511 Pain in right shoulder: Secondary | ICD-10-CM

## 2017-10-24 MED ORDER — METHOCARBAMOL 500 MG PO TABS: 500.0000 mg | ORAL_TABLET | Freq: Four times a day (QID) | ORAL | 0 refills | Status: DC

## 2017-10-24 MED ORDER — HYDROCODONE-ACETAMINOPHEN 10-325 MG PO TABS: 1.0000 | ORAL_TABLET | Freq: Three times a day (TID) | ORAL | 0 refills | Status: DC | PRN

## 2017-10-24 NOTE — Progress Notes (Signed)
Post-Op Visit Note   Patient: Cristian Walker           Date of Birth: 04/28/1981           MRN: [id number] Visit Date: 10/24/2017 PCP: Jac Canavan, PA-C   Assessment & Plan:  Chief Complaint:  Chief Complaint  Patient presents with  . Right Elbow - Wound Check    S/p right cubital tunnel release 10/10/17  Patient comes in for right elbow wound check.  Status post right cubital tunnel release October 10, 2017.  Patient fell onto an outstretched hand and was seen in our office Oct 21, 2017 due to drainage from his incision.  No direct impact to his elbow.  Drainage has resolved and he was put on Keflex.  No complaints of fever chills.  Has had some medial elbow swelling.  He also continues to describe having right-sided cervical radicular pain to the right shoulder and down his arm.  He also continues to describe having ongoing right shoulder impingement symptoms but shoulder pain can be constant at times and aggravated with overhead activity.  Patient was seen by me August 25, 2017 I performed right shoulder diagnostic/therapeutic subacromial Marcaine/Depo-Medrol injection and he states that for a few hours he had excellent relief with Marcaine in place but that the injection wore off later that evening. Visit Diagnoses:  1. Right shoulder pain, unspecified chronicity   2. Radiculopathy, cervical region   3. S/P cubital tunnel release     Plan: Reassured patient that his incision looks good..  New Steri-Strips applied today.  disContinue the Ace bandage around his elbow since this is likely causing increased pain.  Given prescription for Norco 10/325 and Robaxin.  Can also continue ibuprofen.  He will call [facility] imaging see about getting right shoulder MRI scheduled ordered by Dr. Ophelia Charter.  Follow-up as scheduled in a couple weeks for recheck.  Question considering ESI cervical spine ongoing right greater than left sided cervical radiculopathy.  Follow-Up Instructions: Return for Keep  appointment as scheduled with Dr. Ophelia Charter.   Orders:  No orders of the defined types were placed in this encounter.  Meds ordered this encounter  Medications  . HYDROcodone-acetaminophen (NORCO) 10-325 MG tablet    Sig: Take 1 tablet by mouth every 8 (eight) hours as needed.    Dispense:  40 tablet    Refill:  0  . methocarbamol (ROBAXIN) 500 MG tablet    Sig: Take 1 tablet (500 mg total) by mouth 4 (four) times daily.    Dispense:  50 tablet    Refill:  0    Imaging: No results found.  PMFS History: Patient Active Problem List   Diagnosis Date Noted  . Wound dehiscence 10/21/2017  . Cubital tunnel syndrome on right 10/05/2017  . Protrusion of cervical intervertebral disc 10/05/2017  . Radicular pain in right arm 08/16/2017  . Neck pain 08/16/2017  . Right arm pain 08/16/2017  . Chronic right shoulder pain 08/16/2017  . Acute non-recurrent frontal sinusitis 03/29/2017  . Cough 03/29/2017  . Encounter for health maintenance examination in adult 03/04/2016  . History of pneumothorax 03/04/2016  . Elevated blood pressure reading without diagnosis of hypertension 03/04/2016  . Mixed dyslipidemia 03/04/2016   Past Medical History:  Diagnosis Date  . Elevated blood pressure   . Hyperlipidemia   . Impaired fasting blood sugar   . Smoking   . Spontaneous pneumothorax    3 times, 2009, 2007, 2005    Family  History  Problem Relation Age of Onset  . Hypertension Mother   . Heart disease Father        stent  . Alcohol abuse Father   . Cancer Maternal Grandfather        lung, asbestos    Past Surgical History:  Procedure Laterality Date  . LUNG SURGERY  2009, 2007, 2005   spontaneous pneumothorax x 3 surgeries   Social History   Occupational History  . Not on file  Tobacco Use  . Smoking status: Former Smoker    Years: 0.50  . Smokeless tobacco: Never Used  Substance and Sexual Activity  . Alcohol use: No  . Drug use: No  . Sexual activity: Yes    Partners:  Female   Exam Very pleasant white male alert and oriented in no acute distress.  He has moderate to marked right greater than left brachial plexus and trapezius tenderness.  He is markedly tender over the anterior right shoulder more so over the proximal biceps.  Positive impingement test.  Right elbow he does have some swelling medially.  Good elbow range of motion.  Surgical incision looks good.  No drainage.  New Steri-Strips were applied today.  No signs of infection.  Neurovascular intact.

## 2017-10-24 NOTE — Telephone Encounter (Signed)
Pt is  Coming in tomorrow

## 2017-10-24 NOTE — Telephone Encounter (Signed)
Get him in to discuss elevated BPs at ortho office

## 2017-10-24 NOTE — H&P (View-Only) (Signed)
 Post-Op Visit Note   Patient: Cristian Walker           Date of Birth: 03/02/1981           MRN: [id number] Visit Date: 10/24/2017 PCP: Tysinger, David S, PA-C   Assessment & Plan:  Chief Complaint:  Chief Complaint  Patient presents with  . Right Elbow - Wound Check    S/p right cubital tunnel release 10/10/17  Patient comes in for right elbow wound check.  Status post right cubital tunnel release October 10, 2017.  Patient fell onto an outstretched hand and was seen in our office Oct 21, 2017 due to drainage from his incision.  No direct impact to his elbow.  Drainage has resolved and he was put on Keflex.  No complaints of fever chills.  Has had some medial elbow swelling.  He also continues to describe having right-sided cervical radicular pain to the right shoulder and down his arm.  He also continues to describe having ongoing right shoulder impingement symptoms but shoulder pain can be constant at times and aggravated with overhead activity.  Patient was seen by me August 25, 2017 I performed right shoulder diagnostic/therapeutic subacromial Marcaine/Depo-Medrol injection and he states that for a few hours he had excellent relief with Marcaine in place but that the injection wore off later that evening. Visit Diagnoses:  1. Right shoulder pain, unspecified chronicity   2. Radiculopathy, cervical region   3. S/P cubital tunnel release     Plan: Reassured patient that his incision looks good..  New Steri-Strips applied today.  disContinue the Ace bandage around his elbow since this is likely causing increased pain.  Given prescription for Norco 10/325 and Robaxin.  Can also continue ibuprofen.  He will call [facility] see about getting right shoulder MRI scheduled ordered by Dr. Yates.  Follow-up as scheduled in a couple weeks for recheck.  Question considering ESI cervical spine ongoing right greater than left sided cervical radiculopathy.  Follow-Up Instructions: Return for Keep  appointment as scheduled with Dr. Yates.   Orders:  No orders of the defined types were placed in this encounter.  Meds ordered this encounter  Medications  . HYDROcodone-acetaminophen (NORCO) 10-325 MG tablet    Sig: Take 1 tablet by mouth every 8 (eight) hours as needed.    Dispense:  40 tablet    Refill:  0  . methocarbamol (ROBAXIN) 500 MG tablet    Sig: Take 1 tablet (500 mg total) by mouth 4 (four) times daily.    Dispense:  50 tablet    Refill:  0    Imaging: No results found.  PMFS History: Patient Active Problem List   Diagnosis Date Noted  . Wound dehiscence 10/21/2017  . Cubital tunnel syndrome on right 10/05/2017  . Protrusion of cervical intervertebral disc 10/05/2017  . Radicular pain in right arm 08/16/2017  . Neck pain 08/16/2017  . Right arm pain 08/16/2017  . Chronic right shoulder pain 08/16/2017  . Acute non-recurrent frontal sinusitis 03/29/2017  . Cough 03/29/2017  . Encounter for health maintenance examination in adult 03/04/2016  . History of pneumothorax 03/04/2016  . Elevated blood pressure reading without diagnosis of hypertension 03/04/2016  . Mixed dyslipidemia 03/04/2016   Past Medical History:  Diagnosis Date  . Elevated blood pressure   . Hyperlipidemia   . Impaired fasting blood sugar   . Smoking   . Spontaneous pneumothorax    3 times, 2009, 2007, 2005    Family   History  Problem Relation Age of Onset  . Hypertension Mother   . Heart disease Father        stent  . Alcohol abuse Father   . Cancer Maternal Grandfather        lung, asbestos    Past Surgical History:  Procedure Laterality Date  . LUNG SURGERY  2009, 2007, 2005   spontaneous pneumothorax x 3 surgeries   Social History   Occupational History  . Not on file  Tobacco Use  . Smoking status: Former Smoker    Years: 0.50  . Smokeless tobacco: Never Used  Substance and Sexual Activity  . Alcohol use: No  . Drug use: No  . Sexual activity: Yes    Partners:  Female   Exam Very pleasant white male alert and oriented in no acute distress.  He has moderate to marked right greater than left brachial plexus and trapezius tenderness.  He is markedly tender over the anterior right shoulder more so over the proximal biceps.  Positive impingement test.  Right elbow he does have some swelling medially.  Good elbow range of motion.  Surgical incision looks good.  No drainage.  New Steri-Strips were applied today.  No signs of infection.  Neurovascular intact.

## 2017-10-25 ENCOUNTER — Ambulatory Visit (INDEPENDENT_AMBULATORY_CARE_PROVIDER_SITE_OTHER): Payer: 59 | Admitting: Medical

## 2017-10-25 ENCOUNTER — Encounter: Payer: Self-pay | Admitting: Medical

## 2017-10-25 ENCOUNTER — Ambulatory Visit
Admission: RE | Admit: 2017-10-25 | Discharge: 2017-10-25 | Disposition: A | Discharge status: Home / Self Care | Payer: 59 | Source: Ambulatory Visit | Attending: Orthopaedic Surgery | Admitting: Orthopaedic Surgery

## 2017-10-25 VITALS — BP 124/98 | HR 91 | Temp 97.9°F | Ht 66.0 in | Wt 165.4 lb

## 2017-10-25 DIAGNOSIS — M25511 Pain in right shoulder: Secondary | ICD-10-CM | POA: Diagnosis not present

## 2017-10-25 DIAGNOSIS — E782 Mixed hyperlipidemia: Secondary | ICD-10-CM | POA: Diagnosis not present

## 2017-10-25 DIAGNOSIS — G8929 Other chronic pain: Secondary | ICD-10-CM

## 2017-10-25 DIAGNOSIS — R030 Elevated blood-pressure reading, without diagnosis of hypertension: Secondary | ICD-10-CM

## 2017-10-25 NOTE — Progress Notes (Signed)
Subjective: Chief Complaint  Patient presents with  . Blood Pressure Check    elevated during ortho visit    Here at my request for elevated BP.    Prior to 07/2017 his blood pressures recorded here have been normal.  However he has been seeing orthopedics lately and several readings have been elevated.   He has strong family history of heart disease.  He has been dealing with significant shoulder pain.  He says every time he goes to the orthopedic appt he has elevated readings and says he is nervous about what they are going to tell him.  He is still out of work due to shoulder issues, getting MRI shoulder soon.   Had recent issues with carpal tunnel, neck pain.   Has had some recent steroid shots in the shoulder, had MRI neck showing bulging disc, and DDD cervical spine.  He attributes the BP to anxiety at doctors offices and pain.  He doesn't smoke, tries to eat healthy, exercises.  No other aggravating or relieving factors. No other complaint.  Past Medical History:  Diagnosis Date  . Elevated blood pressure   . Hyperlipidemia   . Impaired fasting blood sugar   . Smoking   . Spontaneous pneumothorax    3 times, 2009, 2007, 2005   Current Outpatient Medications on File Prior to Visit  Medication Sig Dispense Refill  . cephALEXin (KEFLEX) 500 MG capsule Take 1 capsule (500 mg total) by mouth 4 (four) times daily. 28 capsule 0  . HYDROcodone-acetaminophen (NORCO) 10-325 MG tablet Take 1 tablet by mouth every 8 (eight) hours as needed. 40 tablet 0  . methocarbamol (ROBAXIN) 500 MG tablet Take 1 tablet (500 mg total) by mouth 4 (four) times daily. 50 tablet 0   No current facility-administered medications on file prior to visit.    Family History  Problem Relation Age of Onset  . Hypertension Mother   . Heart disease Father 50       stent  . Alcohol abuse Father   . Cancer Maternal Grandfather        lung, asbestos    ROS as in subjective   Objective: BP (!) 124/98 (BP Location:  Left Arm, Patient Position: Sitting)   Pulse 91   Temp 97.9 F (36.6 C) (Oral)   Ht  (1.676 m)   Wt 165 lb 6.4 oz (75 kg)   SpO2 98%   BMI 26.70 kg/m   BP Readings from Last 3 Encounters:  10/25/17 (!) 124/98  10/24/17 (!) 143/102  10/19/17 (!) 144/104   Wt Readings from Last 3 Encounters:  10/25/17 165 lb 6.4 oz (75 kg)  10/24/17 170 lb (77.1 kg)  10/19/17 170 lb (77.1 kg)   My reading today in left arm 140/88  General appearance: alert, no distress, WD/WN,  Neck: supple, no lymphadenopathy, no thyromegaly, no masses Heart: RRR, normal S1, S2, no murmurs Lungs: CTA bilaterally, no wheezes, rhonchi, or rales Extremities: no edema, no cyanosis, no clubbing Pulses: 2+ symmetric, upper and lower extremities, normal cap refill Neurological: alert, oriented x 3, CN2-12 intact, strength normal upper extremities and lower extremities, sensation normal throughout, DTRs 2+ throughout, no cerebellar signs, gait normal Psychiatric: normal affect, behavior normal, pleasant    Adult ECG Report  Indication: elevated BP  Rate: 80 bpm  Rhythm: normal sinus rhythm  QRS Axis: -1 degrees  PR Interval:  QRS Duration: 88ms  QTc: 431 ms  Conduction Disturbances: none  Other Abnormalities: none  Patient's cardiac risk factors are: family history of premature cardiovascular disease.  EKG comparison: none  Narrative Interpretation: normal EKG   Assessment: Encounter Diagnoses  Name Primary?  . Elevated blood pressure reading without diagnosis of hypertension Yes  . Mixed dyslipidemia      Plan: He will begin checking BPs at home or drug store.  He will call report in 2 wk.  Discussed his recent readings that have been abnormal but prior to 07/2017 normal readings.   Counseled on diet, exercise, salt limitation.   Discussed classifications of normal vs high BP readings.  Labs today, EKG reviewed.  No medication begun today pending home BP readings . I do think pain and anxiety  are playing a role in regards to his elevated readings.  F/u 2 wk.    Cristian Walker was seen today for blood pressure check.  Diagnoses and all orders for this visit:  Elevated blood pressure reading without diagnosis of hypertension -     Lipid panel -     Comprehensive metabolic panel -     EKG 12-Lead  Mixed dyslipidemia -     Lipid panel -     Comprehensive metabolic panel -     EKG 12-Lead

## 2017-10-25 NOTE — Patient Instructions (Signed)
Normal blood pressure is 120/70.  High is 140 or higher top number, 90 or higher bottom number  Hypertension, commonly called high blood pressure, is when the force of blood pumping through your arteries is too strong. Your arteries are the blood vessels that carry blood from your heart throughout your body. A blood pressure reading consists of a higher number over a lower number, such as 110/72. The higher number (systolic) is the pressure inside your arteries when your heart pumps. The lower number (diastolic) is the pressure inside your arteries when your heart relaxes. Ideally you want your blood pressure below 120/80. Hypertension forces your heart to work harder to pump blood. Your arteries may become narrow or stiff. Having hypertension puts you at risk for heart disease, stroke, and other problems.  RISK FACTORS Some risk factors for high blood pressure are controllable. Others are not.  Risk factors you cannot control include:   Race. You may be at higher risk if you are African American.  Age. Risk increases with age.  Gender. Men are at higher risk than women before age 53 years. After age 106, women are at higher risk than men. Risk factors you can control include:  Not getting enough exercise or physical activity.  Being overweight.  Getting too much fat, sugar, calories, or salt in your diet.  Drinking too much alcohol. SIGNS AND SYMPTOMS Hypertension does not usually cause signs or symptoms. Extremely high blood pressure (hypertensive crisis) may cause headache, anxiety, shortness of breath, and nosebleed. DIAGNOSIS  To check if you have hypertension, your health care provider will measure your blood pressure while you are seated, with your arm held at the level of your heart. It should be measured at least twice using the same arm. Certain conditions can cause a difference in blood pressure between your right and left arms. A blood pressure reading that is higher than normal  on one occasion does not mean that you need treatment. If one blood pressure reading is high, ask your health care provider about having it checked again. BLOOD PRESSURE STAGES Blood pressure is classified into four stages: normal, prehypertension, stage 1, and stage 2. Your blood pressure reading will be used to determine what type of treatment, if any, is necessary. Appropriate treatment options are tied to these four stages:  Normal  Systolic pressure (mm Hg): below 120.  Diastolic pressure (mm Hg): below 80. Prehypertension  Systolic pressure (mm Hg): 120 to 139.  Diastolic pressure (mm Hg): 80 to 89. Stage1  Systolic pressure (mm Hg): 140 to 159.  Diastolic pressure (mm Hg): 90 to 99. Stage2  Systolic pressure (mm Hg): 160 or above.  Diastolic pressure (mm Hg): 100 or above. RISKS RELATED TO HIGH BLOOD PRESSURE Managing your blood pressure is an important responsibility. Uncontrolled high blood pressure can lead to:  A heart attack.  A stroke.  A weakened blood vessel (aneurysm).  Heart failure.  Kidney damage.  Eye damage.  Metabolic syndrome.  Memory and concentration problems. TREATMENT  Treating high blood pressure includes making lifestyle changes and possibly taking medicine. Living a healthy lifestyle can help lower high blood pressure. You may need to change some of your habits. Lifestyle changes may include:  Following the DASH diet. This diet is high in fruits, vegetables, and whole grains. It is low in salt, red meat, and added sugars.  Getting at least 2 hours of brisk physical activity every week.  Losing weight if necessary.  Not smoking.  Limiting alcoholic  beverages.  Learning ways to reduce stress. If lifestyle changes are not enough to get your blood pressure under control, your health care provider may prescribe medicine. You may need to take more than one. Work closely with your health care provider to understand the risks and  benefits. HOME CARE INSTRUCTIONS  Have your blood pressure rechecked as directed by your health care provider.   Take medicines only as directed by your health care provider. Follow the directions carefully. Blood pressure medicines must be taken as prescribed. The medicine does not work as well when you skip doses. Skipping doses also puts you at risk for problems.   Do not smoke.   Monitor your blood pressure at home as directed by your health care provider. SEEK MEDICAL CARE IF:   You think you are having a reaction to medicines taken.  You have recurrent headaches or feel dizzy.  You have swelling in your ankles.  You have trouble with your vision. SEEK IMMEDIATE MEDICAL CARE IF:  You develop a severe headache or confusion.  You have unusual weakness, numbness, or feel faint.  You have severe chest or abdominal pain.  You vomit repeatedly.  You have trouble breathing. MAKE SURE YOU:   Understand these instructions.  Will watch your condition.  Will get help right away if you are not doing well or get worse. Document Released: 06/07/2005 Document Revised: 10/22/2013 Document Reviewed: 03/30/2013 [facility] Patient Information 2015 [place], [place]. This information is not intended to replace advice given to you by your health care provider. Make sure you discuss any questions you have with your health care provider.

## 2017-10-26 ENCOUNTER — Other Ambulatory Visit: Payer: Self-pay | Admitting: Medical

## 2017-10-26 ENCOUNTER — Other Ambulatory Visit: Payer: 59

## 2017-10-26 LAB — LIPID PANEL
CHOLESTEROL TOTAL: 217 mg/dL — AB (ref 100–199)
Chol/HDL Ratio: 5.6 ratio — ABNORMAL HIGH (ref 0.0–5.0)
HDL: 39 mg/dL — ABNORMAL LOW (ref >39)
LDL Calculated: 123 mg/dL — ABNORMAL HIGH (ref 0–99)
Triglycerides: 275 mg/dL — ABNORMAL HIGH (ref 0–149)
VLDL CHOLESTEROL CAL: 55 mg/dL — AB (ref 5–40)

## 2017-10-26 LAB — COMPREHENSIVE METABOLIC PANEL
ALBUMIN: 5 g/dL (ref 3.5–5.5)
ALK PHOS: 83 IU/L (ref 39–117)
ALT: 24 IU/L (ref 0–44)
AST: 17 IU/L (ref 0–40)
Albumin/Globulin Ratio: 2.6 — ABNORMAL HIGH (ref 1.2–2.2)
BUN / CREAT RATIO: 15 (ref 9–20)
BUN: 13 mg/dL (ref 6–20)
Bilirubin Total: 0.5 mg/dL (ref 0.0–1.2)
CO2: 24 mmol/L (ref 20–29)
CREATININE: 0.87 mg/dL (ref 0.76–1.27)
Calcium: 9.7 mg/dL (ref 8.7–10.2)
Chloride: 102 mmol/L (ref 96–106)
GFR calc Af Amer: 126 mL/min/{1.73_m2} (ref >59)
GFR, EST NON AFRICAN AMERICAN: 109 mL/min/{1.73_m2} (ref >59)
GLOBULIN, TOTAL: 1.9 g/dL (ref 1.5–4.5)
Glucose: 104 mg/dL — ABNORMAL HIGH (ref 65–99)
Potassium: 4.3 mmol/L (ref 3.5–5.2)
SODIUM: 142 mmol/L (ref 134–144)
Total Protein: 6.9 g/dL (ref 6.0–8.5)

## 2017-10-26 MED ORDER — PRAVASTATIN SODIUM 20 MG PO TABS: 20.0000 mg | ORAL_TABLET | Freq: Every evening | ORAL | 0 refills | Status: DC

## 2017-11-04 ENCOUNTER — Telehealth (INDEPENDENT_AMBULATORY_CARE_PROVIDER_SITE_OTHER): Payer: Self-pay | Admitting: Surgery

## 2017-11-04 NOTE — Telephone Encounter (Signed)
Please advise 

## 2017-11-04 NOTE — Telephone Encounter (Signed)
Can you please get [name] to advise. He last saw patient on 10/25/27 and prescribed Hydrocodone 10/325 1 po q 8 #40.  Patient has had MRI of shoulder and report is under imaging tab.

## 2017-11-04 NOTE — Telephone Encounter (Signed)
Patient called needing Rx refilled (Hydrocodone) The number to contact patient is [phone]

## 2017-11-08 ENCOUNTER — Encounter (INDEPENDENT_AMBULATORY_CARE_PROVIDER_SITE_OTHER): Payer: Self-pay | Admitting: Orthopaedic Surgery

## 2017-11-08 ENCOUNTER — Ambulatory Visit (INDEPENDENT_AMBULATORY_CARE_PROVIDER_SITE_OTHER): Payer: 59 | Admitting: Orthopaedic Surgery

## 2017-11-08 VITALS — BP 144/104 | HR 105 | Ht 67.0 in | Wt 170.0 lb

## 2017-11-08 DIAGNOSIS — M5020 Other cervical disc displacement, unspecified cervical region: Secondary | ICD-10-CM

## 2017-11-08 MED ORDER — METHOCARBAMOL 500 MG PO TABS: 500.0000 mg | ORAL_TABLET | Freq: Four times a day (QID) | ORAL | 0 refills | Status: DC

## 2017-11-08 NOTE — Progress Notes (Signed)
Office Visit Note   Patient: Cristian Walker           Date of Birth: 03/30/1981           MRN: [id number] Visit Date: 11/08/2017              Requested by: Jac Canavan, PA-C [id number] [place], [place] [address] PCP: Jac Canavan, PA-C   Assessment & Plan: Visit Diagnoses:  1. Protrusion of cervical intervertebral disc   2.     Postop right cubital tunnel release on 422/2019 doing well.  Plan: Patient's MRI of his shoulder is reviewed which is negative.  He still having ongoing significant pain in his neck and shoulder region on the right side.  Patient's MRI on the right at C4-5 shows uncovertebral hypertrophy with flattening of the right ventral CSF.  He has more prominent disc protrusion paracentral on the left side which is consistent with his chronic changes.  He still having continued pain and was given Norco 10/325 by Zonia Kief, PA-C as well as Robaxin which he states is helping.  I discussed with the patient I would recommend proceeding with C4-5 anterior cervical discectomy and fusion for relief of his neck pain and radicular symptoms.  We looked at the x-rays of patient's had cervical spine fusion discussed the operative approach use of a collar postoperatively.  Out of work for 6 to 7 weeks after the surgery.  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  Other levels of the cervical spine were normal.  By clinical exam he had a double crush syndrome in the cubital tunnel release on the right is improved his symptoms but he still having persistent pain in his neck and shoulder on the right which has not been relieved.  Previous EMGs nerve conduction velocity showed chronic changes at C5 on the left biceps and deltoid muscles with active denervation.  Principal problem is been ongoing pain.  He has been on prednisone packs and had injections, anti-inflammatories, narcotic medication.  Procedure discussed questions were  elicited and answered he understands and requests we proceed.  Follow-Up Instructions: No follow-ups on file.   Orders:  No orders of the defined types were placed in this encounter.  Meds ordered this encounter  Medications  . methocarbamol (ROBAXIN) 500 MG tablet    Sig: Take 1 tablet (500 mg total) by mouth 4 (four) times daily.    Dispense:  50 tablet    Refill:  0      Procedures: No procedures performed   Clinical Data: No additional findings.   Subjective: Chief Complaint  Patient presents with  . Right Shoulder - Pain, Follow-up    MRI review    HPI 37 year old male returns post right cubital tunnel release 10/10/2017 with improvement in his elbow and right hand numbness.  Continues do not have ongoing pain in his neck and right shoulder region.  MRI scan of his right shoulder was normal other than a type II acromium without subacromial bursitis.  He has had a history of previous pneumothorax in the past right and left and was on pain medication off and on during that time.  He continues to have throbbing pain in his neck right shoulder region.  Review of Systems positive for previous spontaneous bilateral pneumothoraces treated with chest tubes, repeat chest tubes and sclerosis treatment.  Previous right cubital tunnel release 10/10/2017.  Cervical disc protrusion C4-5 with foraminal  narrowing on the right and paracentral disc protrusion on the left.  Positive EMGs nerve conduction velocities for chronic C5 radiculopathy.  Increased cholesterol.  Blood pressures been elevated at her office and close to normal when he visits his primary care and also at home.   Objective: Vital Signs: BP (!) 144/104   Pulse (!) 105   Ht  (1.702 m)   Wt 170 lb (77.1 kg)   BMI 26.63 kg/m   Physical Exam  Constitutional: He is oriented to person, place, and time. He appears well-developed and well-nourished.  HENT:  Head: Normocephalic and atraumatic.  Eyes: Pupils are equal,  round, and reactive to light. EOM are normal.  Neck: No tracheal deviation present. No thyromegaly present.  Cardiovascular: Normal rate.  Pulmonary/Chest: Effort normal. He has no wheezes.  Abdominal: Soft. Bowel sounds are normal.  Neurological: He is alert and oriented to person, place, and time.  Skin: Skin is warm and dry. Capillary refill takes less than 2 seconds.  Psychiatric: He has a normal mood and affect. His behavior is normal. Judgment and thought content normal.    Ortho Exam ulnar intrinsics on the right are normal.  Patient has normal first dorsal interosseous strength.  FCU is strong.  Triceps is strong.  He has significant brachial plexus tenderness both right and left.  No supraclavicular lymphadenopathy.  Positive Spurling on the right and left.  Negative and shoulder impingement on the right and left.  No lower extremity hyperreflexia.  Specialty Comments:  No specialty comments available.  Imaging: CLINICAL DATA:  Initial evaluation for right clavicular pain radiating into the right upper extremity for 4 months.  EXAM: MRI CERVICAL SPINE WITHOUT CONTRAST  TECHNIQUE: Multiplanar, multisequence MR imaging of the cervical spine was performed. No intravenous contrast was administered.  COMPARISON:  Prior radiographs from 08/25/2017.  FINDINGS: Alignment: Mild straightening of the normal cervical lordosis. No listhesis.  Vertebrae: Vertebral body heights well maintained without evidence for acute or chronic fracture. Bone marrow signal intensity within normal limits. No discrete or worrisome osseous lesions. No abnormal marrow edema.  Cord: Signal intensity within the cervical spinal cord is normal.  Posterior Fossa, vertebral arteries, paraspinal tissues: Visualized brain and posterior fossa within normal limits. Craniocervical junction normal. Paraspinous and prevertebral soft tissues within normal limits. Normal intravascular flow voids present  within the vertebral arteries bilaterally.  Disc levels:  C2-C3: Unremarkable.  C3-C4:  Unremarkable.  C4-C5: Left paracentral/foraminal disc osteophyte complex indents the left ventral thecal sac. Minimal flattening of the left ventral hemi cord without cord signal changes. Left C5 nerve root could potentially be affected. Moderate left foraminal stenosis. Mild right-sided uncovertebral hypertrophy with minimal flattening of the right ventral CSF and borderline mild right foraminal stenosis.  C5-C6:  Minimal annular disc bulge.  No canal or foraminal stenosis.  C6-C7:  Minimal uncovertebral hypertrophy.  No stenosis.  C7-T1:  Unremarkable.  Visualized upper thoracic spine demonstrates no significant findings.  IMPRESSION: 1. Left paracentral/foraminal disc osteophyte complex at C4-5 with resultant moderate left C5 foraminal stenosis. 2. Additional minor degenerative changes as above. No findings to explain patient's right-sided symptoms identified.   Electronically Signed   By: Rise Mu M.D.   On: 09/05/2017 04:52   PMFS History: Patient Active Problem List   Diagnosis Date Noted  . Wound dehiscence 10/21/2017  . Cubital tunnel syndrome on right 10/05/2017  . Protrusion of cervical intervertebral disc 10/05/2017  . Radicular pain in right arm 08/16/2017  . Neck pain  08/16/2017  . Right arm pain 08/16/2017  . Chronic right shoulder pain 08/16/2017  . Acute non-recurrent frontal sinusitis 03/29/2017  . Cough 03/29/2017  . Encounter for health maintenance examination in adult 03/04/2016  . History of pneumothorax 03/04/2016  . Elevated blood pressure reading without diagnosis of hypertension 03/04/2016  . Mixed dyslipidemia 03/04/2016   Past Medical History:  Diagnosis Date  . Elevated blood pressure   . Hyperlipidemia   . Impaired fasting blood sugar   . Smoking   . Spontaneous pneumothorax    3 times, 2009, 2007, 2005    Family  History  Problem Relation Age of Onset  . Hypertension Mother   . Heart disease Father 50       stent  . Alcohol abuse Father   . Cancer Maternal Grandfather        lung, asbestos    Past Surgical History:  Procedure Laterality Date  . LUNG SURGERY  2009, 2007, 2005   spontaneous pneumothorax x 3 surgeries   Social History   Occupational History  . Not on file  Tobacco Use  . Smoking status: Former Smoker    Years: 0.50  . Smokeless tobacco: Never Used  Substance and Sexual Activity  . Alcohol use: No  . Drug use: No  . Sexual activity: Yes    Partners: Female

## 2017-11-16 NOTE — Pre-Procedure Instructions (Signed)
SABASTION HRDLICKA  11/16/2017      [id number] [place], [place] - [address] AT [facility] [address] [facility] [address] [place] [address] Phone: [phone] Fax: [phone]    Your procedure is scheduled on Wed. June 5  Report to [facility] Admitting at 1:00 P.M.  Call this number if you have problems the morning of surgery:  [phone]   Remember:  No food or drinks after midnight.                      Take these medicines the morning of surgery with A SIP OF WATER : methocarbamol (robaxin)             7 days prior to surgery STOP taking any Aspirin(unless otherwise instructed by your surgeon), Aleve, Naproxen, Ibuprofen, Motrin, Advil, Goody's, BC's, all herbal medications, fish oil, and all vitamins    Do not wear jewelry.  Do not wear lotions, powders, or perfumes, or deodorant.  Do not shave 48 hours prior to surgery.  Men may shave face and neck.  Do not bring valuables to the hospital.  [facility] is not responsible for any belongings or valuables.  Contacts, dentures or bridgework may not be worn into surgery.  Leave your suitcase in the car.  After surgery it may be brought to your room.  For patients admitted to the hospital, discharge time will be determined by your treatment team.  Patients discharged the day of surgery will not be allowed to drive home.    Special instructions:  [place]- Preparing For Surgery  Before surgery, you can play an important role. Because skin is not sterile, your skin needs to be as free of germs as possible. You can reduce the number of germs on your skin by washing with CHG (chlorahexidine gluconate) Soap before surgery.  CHG is an antiseptic cleaner which kills germs and bonds with the skin to continue killing germs even after washing.    Oral Hygiene is also important to reduce your risk of infection.  Remember - BRUSH YOUR TEETH THE MORNING OF SURGERY WITH YOUR REGULAR  TOOTHPASTE  Please do not use if you have an allergy to CHG or antibacterial soaps. If your skin becomes reddened/irritated stop using the CHG.  Do not shave (including legs and underarms) for at least 48 hours prior to first CHG shower. It is OK to shave your face.  Please follow these instructions carefully.   1. Shower the NIGHT BEFORE SURGERY and the MORNING OF SURGERY with CHG.   2. If you chose to wash your hair, wash your hair first as usual with your normal shampoo.  3. After you shampoo, rinse your hair and body thoroughly to remove the shampoo.  4. Use CHG as you would any other liquid soap. You can apply CHG directly to the skin and wash gently with a scrungie or a clean washcloth.   5. Apply the CHG Soap to your body ONLY FROM THE NECK DOWN.  Do not use on open wounds or open sores. Avoid contact with your eyes, ears, mouth and genitals (private parts). Wash Face and genitals (private parts)  with your normal soap.  6. Wash thoroughly, paying special attention to the area where your surgery will be performed.  7. Thoroughly rinse your body with warm water from the neck down.  8. DO NOT shower/wash with your normal soap after using and rinsing off  the CHG Soap.  9. Pat yourself dry with a CLEAN TOWEL.  10. Wear CLEAN PAJAMAS to bed the night before surgery, wear comfortable clothes the morning of surgery  11. Place CLEAN SHEETS on your bed the night of your first shower and DO NOT SLEEP WITH PETS.    Day of Surgery:  Do not apply any deodorants/lotions.  Please wear clean clothes to the hospital/surgery center.   Remember to brush your teeth WITH YOUR REGULAR TOOTHPASTE.    Please read over the following fact sheets that you were given. Coughing and Deep Breathing, MRSA Information and Surgical Site Infection Prevention

## 2017-11-17 ENCOUNTER — Encounter (HOSPITAL_COMMUNITY): Payer: Self-pay

## 2017-11-17 ENCOUNTER — Telehealth: Payer: Self-pay | Admitting: Medical

## 2017-11-17 ENCOUNTER — Encounter (HOSPITAL_COMMUNITY)
Admission: RE | Admit: 2017-11-17 | Discharge: 2017-11-17 | Disposition: A | Discharge status: Home / Self Care | Payer: 59 | Source: Ambulatory Visit | Attending: Orthopaedic Surgery | Admitting: Orthopaedic Surgery

## 2017-11-17 ENCOUNTER — Other Ambulatory Visit: Payer: Self-pay

## 2017-11-17 DIAGNOSIS — Z01818 Encounter for other preprocedural examination: Secondary | ICD-10-CM | POA: Insufficient documentation

## 2017-11-17 DIAGNOSIS — M5020 Other cervical disc displacement, unspecified cervical region: Secondary | ICD-10-CM | POA: Diagnosis not present

## 2017-11-17 HISTORY — DX: Other cervical disc displacement, unspecified cervical region: M50.20

## 2017-11-17 LAB — COMPREHENSIVE METABOLIC PANEL
ALBUMIN: 4.3 g/dL (ref 3.5–5.0)
ALT: 25 U/L (ref 17–63)
ANION GAP: 10 (ref 5–15)
AST: 25 U/L (ref 15–41)
Alkaline Phosphatase: 74 U/L (ref 38–126)
BILIRUBIN TOTAL: 0.9 mg/dL (ref 0.3–1.2)
BUN: 15 mg/dL (ref 6–20)
CALCIUM: 9.3 mg/dL (ref 8.9–10.3)
CO2: 22 mmol/L (ref 22–32)
Chloride: 106 mmol/L (ref 101–111)
Creatinine, Ser: 1.13 mg/dL (ref 0.61–1.24)
GLUCOSE: 160 mg/dL — AB (ref 65–99)
POTASSIUM: 3.9 mmol/L (ref 3.5–5.1)
Sodium: 138 mmol/L (ref 135–145)
TOTAL PROTEIN: 6.8 g/dL (ref 6.5–8.1)

## 2017-11-17 LAB — CBC
HEMATOCRIT: 46.1 % (ref 39.0–52.0)
Hemoglobin: 16.2 g/dL (ref 13.0–17.0)
MCH: 29.2 pg (ref 26.0–34.0)
MCHC: 35.1 g/dL (ref 30.0–36.0)
MCV: 83.2 fL (ref 78.0–100.0)
Platelets: 299 10*3/uL (ref 150–400)
RBC: 5.54 MIL/uL (ref 4.22–5.81)
RDW: 12.1 % (ref 11.5–15.5)
WBC: 6.4 10*3/uL (ref 4.0–10.5)

## 2017-11-17 LAB — SURGICAL PCR SCREEN
MRSA, PCR: NEGATIVE
STAPHYLOCOCCUS AUREUS: POSITIVE — AB

## 2017-11-17 NOTE — Progress Notes (Signed)
Pt made aware of positive Staph pcr result. [facility]

## 2017-11-17 NOTE — Telephone Encounter (Signed)
Pt came in and dropped of bp readings. Sending back for review.  ALSO, pt wanted to let Vincenza Hews know that he is having spine surgery with Dr. Ophelia Charter.

## 2017-11-17 NOTE — Progress Notes (Signed)
PCP - PA [name]  Cardiologist - Denies  Chest x-ray - Denies  EKG - 10/27/17 (E)  Stress Test - Denies  ECHO - Denies  Cardiac Cath - Denies  Sleep Study - Denies CPAP - None  LABS- 11/17/17: CBC, CMP  ASA- Denies  Anesthesia- No  Pt denies having chest pain, sob, or fever at this time. All instructions explained to the pt, with a verbal understanding of the material. Pt agrees to go over the instructions while at home for a better understanding. The opportunity to ask questions was provided.

## 2017-11-18 ENCOUNTER — Telehealth: Payer: Self-pay | Admitting: Medical

## 2017-11-18 ENCOUNTER — Other Ambulatory Visit: Payer: Self-pay | Admitting: Medical

## 2017-11-18 MED ORDER — QUINAPRIL HCL 10 MG PO TABS: 10.0000 mg | ORAL_TABLET | Freq: Every day | ORAL | 2 refills | Status: DC

## 2017-11-18 NOTE — Telephone Encounter (Signed)
Pt called and informed of Cristian Walker's instructions

## 2017-11-18 NOTE — Telephone Encounter (Signed)
I sent Quinapril to begin once daily in the morning.  If any mouth or tongue swelling get checked out right away as this would be the main allergic reaction.  Most people do fine on this medication.   I recommend he go ahead and start 1/2 tablet daily for a week, then once daily.   He can go ahead and start it now.

## 2017-11-18 NOTE — Telephone Encounter (Signed)
Patient called back he is agreeable to start medication for Blood Pressure [facility] [address]    He wanted me to make sure you know that he is having spinal/neck surgery on November 23, 2017 and has already had his pre-op appointment.   Is it ok if he starts the BP medication after the surgery?   Please advise.   Thank you

## 2017-11-18 NOTE — Telephone Encounter (Signed)
I reviewed his recent blood pressures that he is sent in.  At least half of the numbers are elevated.  Given his diagnosis of high cholesterol and family history of premature heart disease in father I would lean towards putting him on medication to lower the pressure and reduce potential complications of heart disease.    If agreeable I will send a medication to the pharmacy to begin.  I would like to see him back in a month after starting the medication.  Let me know

## 2017-11-18 NOTE — Telephone Encounter (Signed)
Left message on voicemail on voicemail for patient to call back.

## 2017-11-23 ENCOUNTER — Ambulatory Visit (HOSPITAL_COMMUNITY): Payer: 59

## 2017-11-23 ENCOUNTER — Ambulatory Visit (HOSPITAL_COMMUNITY): Payer: 59 | Admitting: Certified Registered Nurse Anesthetist

## 2017-11-23 ENCOUNTER — Encounter (HOSPITAL_COMMUNITY)
Admission: RE | Disposition: A | Discharge status: Home / Self Care | Payer: Self-pay | Source: Ambulatory Visit | Attending: Orthopaedic Surgery

## 2017-11-23 ENCOUNTER — Observation Stay (HOSPITAL_COMMUNITY)
Admission: RE | Admit: 2017-11-23 | Discharge: 2017-11-24 | Disposition: A | Discharge status: Home / Self Care | Payer: 59 | Source: Ambulatory Visit | Attending: Orthopaedic Surgery | Admitting: Orthopaedic Surgery

## 2017-11-23 ENCOUNTER — Encounter (HOSPITAL_COMMUNITY): Payer: Self-pay | Admitting: *Deleted

## 2017-11-23 DIAGNOSIS — M4802 Spinal stenosis, cervical region: Secondary | ICD-10-CM | POA: Insufficient documentation

## 2017-11-23 DIAGNOSIS — I10 Essential (primary) hypertension: Secondary | ICD-10-CM | POA: Diagnosis not present

## 2017-11-23 DIAGNOSIS — M4722 Other spondylosis with radiculopathy, cervical region: Principal | ICD-10-CM | POA: Insufficient documentation

## 2017-11-23 DIAGNOSIS — Z87891 Personal history of nicotine dependence: Secondary | ICD-10-CM | POA: Insufficient documentation

## 2017-11-23 DIAGNOSIS — E782 Mixed hyperlipidemia: Secondary | ICD-10-CM | POA: Diagnosis not present

## 2017-11-23 DIAGNOSIS — Z79899 Other long term (current) drug therapy: Secondary | ICD-10-CM | POA: Insufficient documentation

## 2017-11-23 DIAGNOSIS — M4322 Fusion of spine, cervical region: Secondary | ICD-10-CM | POA: Diagnosis not present

## 2017-11-23 DIAGNOSIS — M50221 Other cervical disc displacement at C4-C5 level: Secondary | ICD-10-CM | POA: Diagnosis not present

## 2017-11-23 DIAGNOSIS — M47812 Spondylosis without myelopathy or radiculopathy, cervical region: Secondary | ICD-10-CM | POA: Diagnosis not present

## 2017-11-23 DIAGNOSIS — E785 Hyperlipidemia, unspecified: Secondary | ICD-10-CM | POA: Diagnosis not present

## 2017-11-23 DIAGNOSIS — M50121 Cervical disc disorder at C4-C5 level with radiculopathy: Secondary | ICD-10-CM | POA: Diagnosis not present

## 2017-11-23 DIAGNOSIS — Z419 Encounter for procedure for purposes other than remedying health state, unspecified: Secondary | ICD-10-CM

## 2017-11-23 HISTORY — PX: ANTERIOR CERVICAL DECOMP/DISCECTOMY FUSION: SHX1161

## 2017-11-23 SURGERY — ANTERIOR CERVICAL DECOMPRESSION/DISCECTOMY FUSION 1 LEVEL
Anesthesia: General | Site: Neck

## 2017-11-23 MED ORDER — ACETAMINOPHEN 650 MG RE SUPP: 650.0000 mg | RECTAL | Status: DC | PRN

## 2017-11-23 MED ORDER — ONDANSETRON HCL 4 MG/2ML IJ SOLN: 4.0000 mg | Freq: Four times a day (QID) | INTRAMUSCULAR | Status: DC | PRN

## 2017-11-23 MED ORDER — SODIUM CHLORIDE 0.9% FLUSH
3.0000 mL | Freq: Two times a day (BID) | INTRAVENOUS | Status: DC
  Administered 2017-11-23: 3 mL via INTRAVENOUS

## 2017-11-23 MED ORDER — PHENYLEPHRINE 40 MCG/ML (10ML) SYRINGE FOR IV PUSH (FOR BLOOD PRESSURE SUPPORT)
PREFILLED_SYRINGE | INTRAVENOUS | Status: AC
  Filled 2017-11-23: qty 10

## 2017-11-23 MED ORDER — CEFAZOLIN SODIUM-DEXTROSE 2-4 GM/100ML-% IV SOLN
2.0000 g | INTRAVENOUS | Status: AC
  Administered 2017-11-23: 2 g via INTRAVENOUS

## 2017-11-23 MED ORDER — SUGAMMADEX SODIUM 200 MG/2ML IV SOLN
INTRAVENOUS | Status: DC | PRN
  Administered 2017-11-23: 200 mg via INTRAVENOUS

## 2017-11-23 MED ORDER — DEXAMETHASONE SODIUM PHOSPHATE 10 MG/ML IJ SOLN
INTRAMUSCULAR | Status: AC
  Filled 2017-11-23: qty 1

## 2017-11-23 MED ORDER — KETOROLAC TROMETHAMINE 30 MG/ML IJ SOLN
30.0000 mg | Freq: Once | INTRAMUSCULAR | Status: AC | PRN
  Administered 2017-11-23: 30 mg via INTRAVENOUS

## 2017-11-23 MED ORDER — MIDAZOLAM HCL 2 MG/2ML IJ SOLN
INTRAMUSCULAR | Status: DC | PRN
  Administered 2017-11-23: 2 mg via INTRAVENOUS

## 2017-11-23 MED ORDER — PROPOFOL 10 MG/ML IV BOLUS
INTRAVENOUS | Status: AC
  Filled 2017-11-23: qty 20

## 2017-11-23 MED ORDER — DOCUSATE SODIUM 100 MG PO CAPS
100.0000 mg | ORAL_CAPSULE | Freq: Two times a day (BID) | ORAL | Status: DC
  Administered 2017-11-23 – 2017-11-24 (×2): 100 mg via ORAL
  Filled 2017-11-23 (×2): qty 1

## 2017-11-23 MED ORDER — HYDROMORPHONE HCL 1 MG/ML IJ SOLN: 0.5000 mg | INTRAMUSCULAR | Status: DC | PRN

## 2017-11-23 MED ORDER — FENTANYL CITRATE (PF) 250 MCG/5ML IJ SOLN
INTRAMUSCULAR | Status: AC
  Filled 2017-11-23: qty 5

## 2017-11-23 MED ORDER — OXYCODONE HCL 5 MG PO TABS
5.0000 mg | ORAL_TABLET | Freq: Four times a day (QID) | ORAL | Status: DC | PRN
  Administered 2017-11-23 – 2017-11-24 (×2): 5 mg via ORAL
  Filled 2017-11-23 (×2): qty 1

## 2017-11-23 MED ORDER — ACETAMINOPHEN 10 MG/ML IV SOLN
INTRAVENOUS | Status: AC
  Filled 2017-11-23: qty 100

## 2017-11-23 MED ORDER — LACTATED RINGERS IV SOLN
INTRAVENOUS | Status: DC
Start: 2017-11-23 — End: 2017-11-24
  Administered 2017-11-23 (×2): via INTRAVENOUS

## 2017-11-23 MED ORDER — ONDANSETRON HCL 4 MG/2ML IJ SOLN
INTRAMUSCULAR | Status: AC
  Filled 2017-11-23: qty 4

## 2017-11-23 MED ORDER — DEXAMETHASONE SODIUM PHOSPHATE 10 MG/ML IJ SOLN
INTRAMUSCULAR | Status: DC | PRN
  Administered 2017-11-23: 10 mg via INTRAVENOUS

## 2017-11-23 MED ORDER — PHENYLEPHRINE HCL 10 MG/ML IJ SOLN
INTRAMUSCULAR | Status: AC
  Filled 2017-11-23: qty 2

## 2017-11-23 MED ORDER — ROCURONIUM BROMIDE 10 MG/ML (PF) SYRINGE
PREFILLED_SYRINGE | INTRAVENOUS | Status: DC | PRN
  Administered 2017-11-23: 50 mg via INTRAVENOUS

## 2017-11-23 MED ORDER — LISINOPRIL 10 MG PO TABS
10.0000 mg | ORAL_TABLET | Freq: Every day | ORAL | Status: DC
  Administered 2017-11-23: 10 mg via ORAL
  Filled 2017-11-23: qty 1

## 2017-11-23 MED ORDER — LIDOCAINE 2% (20 MG/ML) 5 ML SYRINGE
INTRAMUSCULAR | Status: DC | PRN
  Administered 2017-11-23: 100 mg via INTRAVENOUS

## 2017-11-23 MED ORDER — METHOCARBAMOL 1000 MG/10ML IJ SOLN
500.0000 mg | Freq: Four times a day (QID) | INTRAVENOUS | Status: DC | PRN
  Filled 2017-11-23: qty 5

## 2017-11-23 MED ORDER — POLYETHYLENE GLYCOL 3350 17 G PO PACK: 17.0000 g | PACK | Freq: Every day | ORAL | Status: DC

## 2017-11-23 MED ORDER — PRAVASTATIN SODIUM 40 MG PO TABS
20.0000 mg | ORAL_TABLET | Freq: Every evening | ORAL | Status: DC
  Administered 2017-11-23: 20 mg via ORAL
  Filled 2017-11-23: qty 1

## 2017-11-23 MED ORDER — SODIUM CHLORIDE 0.9 % IV SOLN: 250.0000 mL | INTRAVENOUS | Status: DC

## 2017-11-23 MED ORDER — SODIUM CHLORIDE 0.9% FLUSH: 3.0000 mL | INTRAVENOUS | Status: DC | PRN

## 2017-11-23 MED ORDER — HYDROMORPHONE HCL 2 MG/ML IJ SOLN
INTRAMUSCULAR | Status: AC
  Administered 2017-11-23: 0.5 mg via INTRAVENOUS
  Filled 2017-11-23: qty 1

## 2017-11-23 MED ORDER — ROCURONIUM BROMIDE 10 MG/ML (PF) SYRINGE
PREFILLED_SYRINGE | INTRAVENOUS | Status: AC
  Filled 2017-11-23: qty 20

## 2017-11-23 MED ORDER — CEFAZOLIN SODIUM-DEXTROSE 1-4 GM/50ML-% IV SOLN
1.0000 g | Freq: Three times a day (TID) | INTRAVENOUS | Status: AC
  Administered 2017-11-23 – 2017-11-24 (×2): 1 g via INTRAVENOUS
  Filled 2017-11-23 (×2): qty 50

## 2017-11-23 MED ORDER — LIDOCAINE 2% (20 MG/ML) 5 ML SYRINGE
INTRAMUSCULAR | Status: AC
  Filled 2017-11-23: qty 5

## 2017-11-23 MED ORDER — MEPERIDINE HCL 50 MG/ML IJ SOLN: 6.2500 mg | INTRAMUSCULAR | Status: DC | PRN

## 2017-11-23 MED ORDER — ACETAMINOPHEN 10 MG/ML IV SOLN
INTRAVENOUS | Status: DC | PRN
  Administered 2017-11-23: 1000 mg via INTRAVENOUS

## 2017-11-23 MED ORDER — PHENOL 1.4 % MT LIQD: 1.0000 | OROMUCOSAL | Status: DC | PRN

## 2017-11-23 MED ORDER — ACETAMINOPHEN 325 MG PO TABS: 650.0000 mg | ORAL_TABLET | ORAL | Status: DC | PRN

## 2017-11-23 MED ORDER — PHENYLEPHRINE HCL 10 MG/ML IJ SOLN
INTRAVENOUS | Status: DC | PRN
  Administered 2017-11-23: 15 ug/min via INTRAVENOUS

## 2017-11-23 MED ORDER — BUPIVACAINE-EPINEPHRINE (PF) 0.5% -1:200000 IJ SOLN
INTRAMUSCULAR | Status: AC
  Filled 2017-11-23: qty 30

## 2017-11-23 MED ORDER — CHLORHEXIDINE GLUCONATE 4 % EX LIQD: 60.0000 mL | Freq: Once | CUTANEOUS | Status: DC

## 2017-11-23 MED ORDER — MIDAZOLAM HCL 2 MG/2ML IJ SOLN
INTRAMUSCULAR | Status: AC
  Filled 2017-11-23: qty 2

## 2017-11-23 MED ORDER — SODIUM CHLORIDE 0.9 % IV SOLN: INTRAVENOUS | Status: DC

## 2017-11-23 MED ORDER — MENTHOL 3 MG MT LOZG
1.0000 | LOZENGE | OROMUCOSAL | Status: DC | PRN
  Administered 2017-11-24: 3 mg via ORAL
  Filled 2017-11-23: qty 9

## 2017-11-23 MED ORDER — CEFAZOLIN SODIUM-DEXTROSE 2-4 GM/100ML-% IV SOLN
INTRAVENOUS | Status: AC
  Filled 2017-11-23: qty 100

## 2017-11-23 MED ORDER — KETOROLAC TROMETHAMINE 30 MG/ML IJ SOLN
INTRAMUSCULAR | Status: AC
  Administered 2017-11-23: 30 mg via INTRAVENOUS
  Filled 2017-11-23: qty 1

## 2017-11-23 MED ORDER — ONDANSETRON HCL 4 MG PO TABS: 4.0000 mg | ORAL_TABLET | Freq: Four times a day (QID) | ORAL | Status: DC | PRN

## 2017-11-23 MED ORDER — 0.9 % SODIUM CHLORIDE (POUR BTL) OPTIME
TOPICAL | Status: DC | PRN
  Administered 2017-11-23: 1000 mL

## 2017-11-23 MED ORDER — FENTANYL CITRATE (PF) 250 MCG/5ML IJ SOLN
INTRAMUSCULAR | Status: DC | PRN
  Administered 2017-11-23 (×3): 50 ug via INTRAVENOUS
  Administered 2017-11-23: 100 ug via INTRAVENOUS

## 2017-11-23 MED ORDER — METHOCARBAMOL 500 MG PO TABS
500.0000 mg | ORAL_TABLET | Freq: Four times a day (QID) | ORAL | Status: DC | PRN
  Administered 2017-11-23 – 2017-11-24 (×3): 500 mg via ORAL
  Filled 2017-11-23 (×3): qty 1

## 2017-11-23 MED ORDER — PROPOFOL 10 MG/ML IV BOLUS
INTRAVENOUS | Status: DC | PRN
  Administered 2017-11-23: 200 mg via INTRAVENOUS

## 2017-11-23 MED ORDER — PROMETHAZINE HCL 25 MG/ML IJ SOLN: 6.2500 mg | INTRAMUSCULAR | Status: DC | PRN

## 2017-11-23 MED ORDER — BUPIVACAINE-EPINEPHRINE 0.5% -1:200000 IJ SOLN
INTRAMUSCULAR | Status: DC | PRN
  Administered 2017-11-23: 6 mL

## 2017-11-23 MED ORDER — ONDANSETRON HCL 4 MG/2ML IJ SOLN
INTRAMUSCULAR | Status: DC | PRN
  Administered 2017-11-23: 4 mg via INTRAVENOUS

## 2017-11-23 MED ORDER — PHENYLEPHRINE HCL 10 MG/ML IJ SOLN
INTRAMUSCULAR | Status: DC | PRN
  Administered 2017-11-23 (×2): 40 ug via INTRAVENOUS

## 2017-11-23 MED ORDER — HYDROMORPHONE HCL 2 MG/ML IJ SOLN
0.2500 mg | INTRAMUSCULAR | Status: DC | PRN
Start: 2017-11-23 — End: 2017-11-23
  Administered 2017-11-23: 0.5 mg via INTRAVENOUS

## 2017-11-23 SURGICAL SUPPLY — 58 items
APL SKNCLS STERI-STRIP NONHPOA (GAUZE/BANDAGES/DRESSINGS) ×1
BENZOIN TINCTURE PRP APPL 2/3 (GAUZE/BANDAGES/DRESSINGS) ×3 IMPLANT
BIT DRILL SRG 14X2.2XFLT CHK (BIT) IMPLANT
BIT DRL SRG 14X2.2XFLT CHK (BIT) ×1
BLADE CLIPPER SURG (BLADE) IMPLANT
BONE CERV LORDOTIC 14.5X12X6 (Bone Implant) ×3 IMPLANT
BUR ROUND FLUTED 4 SOFT TCH (BURR) IMPLANT
BUR ROUND FLUTED 4MM SOFT TCH (BURR)
CLOSURE WOUND 1/2 X4 (GAUZE/BANDAGES/DRESSINGS) ×1
COLLAR CERV LO CONTOUR FIRM DE (SOFTGOODS) ×3 IMPLANT
COVER MAYO STAND STRL (DRAPES) ×3 IMPLANT
COVER SURGICAL LIGHT HANDLE (MISCELLANEOUS) ×3 IMPLANT
CRADLE DONUT ADULT HEAD (MISCELLANEOUS) ×3 IMPLANT
DRAPE C-ARM 42X72 X-RAY (DRAPES) ×3 IMPLANT
DRAPE HALF SHEET 40X57 (DRAPES) ×3 IMPLANT
DRAPE MICROSCOPE LEICA (MISCELLANEOUS) ×3 IMPLANT
DRILL BIT SKYLINE 14MM (BIT) ×3
DURAPREP 6ML APPLICATOR 50/CS (WOUND CARE) ×3 IMPLANT
ELECT COATED BLADE 2.86 ST (ELECTRODE) ×3 IMPLANT
ELECT REM PT RETURN 9FT ADLT (ELECTROSURGICAL) ×3
ELECTRODE REM PT RTRN 9FT ADLT (ELECTROSURGICAL) ×1 IMPLANT
EVACUATOR 1/8 PVC DRAIN (DRAIN) ×5 IMPLANT
GAUZE SPONGE 4X4 12PLY STRL (GAUZE/BANDAGES/DRESSINGS) ×3 IMPLANT
GLOVE BIO SURGEON STRL SZ 6.5 (GLOVE) ×3 IMPLANT
GLOVE BIO SURGEONS STRL SZ 6.5 (GLOVE) ×3
GLOVE BIOGEL PI IND STRL 8 (GLOVE) ×2 IMPLANT
GLOVE BIOGEL PI INDICATOR 8 (GLOVE) ×4
GLOVE ORTHO TXT STRL SZ7.5 (GLOVE) ×6 IMPLANT
GOWN STRL REUS W/ TWL LRG LVL3 (GOWN DISPOSABLE) ×1 IMPLANT
GOWN STRL REUS W/ TWL XL LVL3 (GOWN DISPOSABLE) ×1 IMPLANT
GOWN STRL REUS W/TWL 2XL LVL3 (GOWN DISPOSABLE) ×3 IMPLANT
GOWN STRL REUS W/TWL LRG LVL3 (GOWN DISPOSABLE) ×3
GOWN STRL REUS W/TWL XL LVL3 (GOWN DISPOSABLE) ×3
GRAFT BNE SPCR VG2 14.5X12X6 (Bone Implant) IMPLANT
HEAD HALTER (SOFTGOODS) ×3 IMPLANT
KIT BASIN OR (CUSTOM PROCEDURE TRAY) ×3 IMPLANT
KIT TURNOVER KIT B (KITS) ×3 IMPLANT
MANIFOLD NEPTUNE II (INSTRUMENTS) ×3 IMPLANT
NDL 25GX 5/8IN NON SAFETY (NEEDLE) ×1 IMPLANT
NEEDLE 25GX 5/8IN NON SAFETY (NEEDLE) ×3 IMPLANT
NS IRRIG 1000ML POUR BTL (IV SOLUTION) ×3 IMPLANT
PACK ORTHO CERVICAL (CUSTOM PROCEDURE TRAY) ×3 IMPLANT
PAD ARMBOARD 7.5X6 YLW CONV (MISCELLANEOUS) ×6 IMPLANT
PATTIES SURGICAL .5 X.5 (GAUZE/BANDAGES/DRESSINGS) IMPLANT
PLATE SKYLINE 12MM (Plate) ×2 IMPLANT
SCREW VAR SELF TAP SKYLINE 14M (Screw) ×8 IMPLANT
SCREW VARIABLE SELF TAP 12MM (Screw) ×4 IMPLANT
STRIP CLOSURE SKIN 1/2X4 (GAUZE/BANDAGES/DRESSINGS) ×2 IMPLANT
SURGIFLO W/THROMBIN 8M KIT (HEMOSTASIS) IMPLANT
SUT BONE WAX W31G (SUTURE) ×3 IMPLANT
SUT VIC AB 3-0 PS2 18 (SUTURE) ×3
SUT VIC AB 3-0 PS2 18XBRD (SUTURE) ×1 IMPLANT
SUT VIC AB 4-0 PS2 27 (SUTURE) ×3 IMPLANT
SYR BULB 3OZ (MISCELLANEOUS) ×3 IMPLANT
TAPE STRIPS DRAPE STRL (GAUZE/BANDAGES/DRESSINGS) ×2 IMPLANT
TOWEL OR 17X24 6PK STRL BLUE (TOWEL DISPOSABLE) ×3 IMPLANT
TOWEL OR 17X26 10 PK STRL BLUE (TOWEL DISPOSABLE) ×3 IMPLANT
WATER STERILE IRR 1000ML POUR (IV SOLUTION) ×3 IMPLANT

## 2017-11-23 NOTE — Anesthesia Procedure Notes (Signed)
Procedure Name: Intubation Date/Time: 11/23/2017 3:39 PM Performed by: Clearnce Sorrel, CRNA Pre-anesthesia Checklist: Patient identified, Emergency Drugs available, Suction available, Patient being monitored and Timeout performed Patient Re-evaluated:Patient Re-evaluated prior to induction Oxygen Delivery Method: Circle system utilized Preoxygenation: Pre-oxygenation with 100% oxygen Induction Type: IV induction Ventilation: Mask ventilation without difficulty Laryngoscope Size: Mac, 3 and Glidescope Grade View: Grade II Tube type: Oral Tube size: 7.5 mm Number of attempts: 2 Airway Equipment and Method: Stylet Placement Confirmation: ETT inserted through vocal cords under direct vision,  positive ETCO2 and breath sounds checked- equal and bilateral Secured at: 23 cm Tube secured with: Tape Dental Injury: Teeth and Oropharynx as per pre-operative assessment

## 2017-11-23 NOTE — Transfer of Care (Signed)
Immediate Anesthesia Transfer of Care Note  Patient: Cristian Walker  Procedure(s) Performed: C4-5 ANTERIOR CERVICAL DECOMPRESSION/DISCECTOMY FUSION, ALLOGRAFT, PLATE (N/A Neck)  Patient Location: PACU  Anesthesia Type:General  Level of Consciousness: awake, alert  and oriented  Airway & Oxygen Therapy: Patient Spontanous Breathing and Patient connected to nasal cannula oxygen  Post-op Assessment: Report given to RN and Post -op Vital signs reviewed and stable  Post vital signs: Reviewed and stable  Last Vitals:  Vitals Value Taken Time  BP 129/75 11/23/2017  5:25 PM  Temp 36.5 C 11/23/2017  5:24 PM  Pulse 89 11/23/2017  5:27 PM  Resp 14 11/23/2017  5:27 PM  SpO2 95 % 11/23/2017  5:27 PM  Vitals shown include unvalidated device data.  Last Pain:  Vitals:   11/23/17 1724  TempSrc:   PainSc: 6       Patients Stated Pain Goal: 3 (11/23/17 1340)  Complications: No apparent anesthesia complications

## 2017-11-23 NOTE — Anesthesia Preprocedure Evaluation (Signed)
Anesthesia Evaluation  Patient identified by MRN, date of birth, ID band Patient awake    Reviewed: Allergy & Precautions, NPO status   Airway Mallampati: I       Dental no notable dental hx. (+) Teeth Intact   Pulmonary former smoker,    Pulmonary exam normal breath sounds clear to auscultation       Cardiovascular hypertension, Pt. on medications Normal cardiovascular exam Rhythm:Regular Rate:Normal     Neuro/Psych negative psych ROS   GI/Hepatic negative GI ROS, Neg liver ROS,   Endo/Other  negative endocrine ROS  Renal/GU negative Renal ROS  negative genitourinary   Musculoskeletal   Abdominal Normal abdominal exam  (+)   Peds  Hematology negative hematology ROS (+)   Anesthesia Other Findings   Reproductive/Obstetrics                             Anesthesia Physical Anesthesia Plan  ASA: II  Anesthesia Plan: General   Post-op Pain Management:    Induction: Intravenous  PONV Risk Score and Plan: 3 and Ondansetron, Dexamethasone and Midazolam  Airway Management Planned: Oral ETT  Additional Equipment:   Intra-op Plan:   Post-operative Plan: Extubation in OR  Informed Consent: I have reviewed the patients History and Physical, chart, labs and discussed the procedure including the risks, benefits and alternatives for the proposed anesthesia with the patient or authorized representative who has indicated his/her understanding and acceptance.   Dental advisory given  Plan Discussed with: CRNA and Surgeon  Anesthesia Plan Comments:         Anesthesia Quick Evaluation

## 2017-11-23 NOTE — Progress Notes (Signed)
Orthopedic Tech Progress Note Patient Details:  Cristian RaringMichael B Walker 10/20/1980 [id number]  Ortho Devices Type of Ortho Device: Soft collar Ortho Device/Splint Location: bedside Ortho Device/Splint Interventions: Casandra DoffingOrdered       [name] Craig 11/23/2017, 6:36 PM

## 2017-11-23 NOTE — Telephone Encounter (Signed)
Pt was informed last week of this

## 2017-11-23 NOTE — Interval H&P Note (Signed)
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 After consideration of risks, benefits and other options for treatment, the patient has consented to  Procedure(s): C4-5 ANTERIOR CERVICAL DECOMPRESSION/DISCECTOMY FUSION, ALLOGRAFT, PLATE (N/A) as a surgical intervention .  The patient's history has been reviewed, patient examined, no change in status, stable for surgery.  I have reviewed the patient's chart and labs.  Questions were answered to the patient's satisfaction.     Eldred MangesMark C Shamarr Faucett

## 2017-11-23 NOTE — Interval H&P Note (Signed)
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 After consideration of risks, benefits and other options for treatment, the patient has consented to  Procedure(s): C4-5 ANTERIOR CERVICAL DECOMPRESSION/DISCECTOMY FUSION, ALLOGRAFT, PLATE (N/A) as a surgical intervention .  The patient's history has been reviewed, patient examined, no change in status, stable for surgery.  I have reviewed the patient's chart and labs.  Questions were answered to the patient's satisfaction.     Tecumseh Yeagley C Trev Boley   

## 2017-11-24 ENCOUNTER — Other Ambulatory Visit: Payer: Self-pay

## 2017-11-24 ENCOUNTER — Encounter (HOSPITAL_COMMUNITY): Payer: Self-pay | Admitting: Orthopaedic Surgery

## 2017-11-24 DIAGNOSIS — M50121 Cervical disc disorder at C4-C5 level with radiculopathy: Secondary | ICD-10-CM | POA: Diagnosis not present

## 2017-11-24 DIAGNOSIS — M4722 Other spondylosis with radiculopathy, cervical region: Secondary | ICD-10-CM | POA: Diagnosis not present

## 2017-11-24 MED ORDER — OXYCODONE HCL 5 MG PO TABS
10.0000 mg | ORAL_TABLET | Freq: Once | ORAL | Status: AC
  Administered 2017-11-24: 10 mg via ORAL
  Filled 2017-11-24: qty 2

## 2017-11-24 MED ORDER — OXYCODONE-ACETAMINOPHEN 7.5-325 MG PO TABS: 1.0000 | ORAL_TABLET | Freq: Four times a day (QID) | ORAL | 0 refills | Status: DC | PRN

## 2017-11-24 MED ORDER — METHOCARBAMOL 500 MG PO TABS: 500.0000 mg | ORAL_TABLET | Freq: Four times a day (QID) | ORAL | 0 refills | Status: DC | PRN

## 2017-11-24 NOTE — Plan of Care (Signed)
  Problem: Activity: Goal: Ability to avoid complications of mobility impairment will improve Outcome: Progressing Goal: Ability to tolerate increased activity will improve Outcome: Progressing Goal: Will remain free from falls Outcome: Progressing   Problem: Bowel/Gastric: Goal: Gastrointestinal status for postoperative course will improve Outcome: Progressing   Problem: Education: Goal: Ability to verbalize activity precautions or restrictions will improve Outcome: Progressing Goal: Knowledge of the prescribed therapeutic regimen will improve Outcome: Progressing Goal: Understanding of discharge needs will improve Outcome: Progressing   Problem: Physical Regulation: Goal: Ability to maintain clinical measurements within normal limits will improve Outcome: Progressing Goal: Postoperative complications will be avoided or minimized Outcome: Progressing Goal: Diagnostic test results will improve Outcome: Progressing   Problem: Pain Management: Goal: Pain level will decrease Outcome: Progressing   Problem: Skin Integrity: Goal: Signs of wound healing will improve Outcome: Progressing   Problem: Health Behavior/Discharge Planning: Goal: Identification of resources available to assist in meeting health care needs will improve Outcome: Progressing   Problem: Bladder/Genitourinary: Goal: Urinary functional status for postoperative course will improve Outcome: Progressing   Problem: Safety: Goal: Ability to remain free from injury will improve Outcome: Progressing   

## 2017-11-24 NOTE — Discharge Instructions (Signed)
Orthopedic discharge instruction  -collar must be on at all times.  -ok to shower 5 days postop with collar on.  Do not apply any creams or ointments to incision.    -do not bend neck.   -no pushing, pulling, lifting, driving.    -if you have any difficulty breathing, or swallowing you should go to ER and/or contact our office immediately.

## 2017-11-24 NOTE — Progress Notes (Signed)
Subjective: Patient doing well this morning.  Was complaining of pain last night.  Nurses did not give Dilaudid IV that I had ordered.  Preop shoulder arm pain is improved.  He has been up and ambulating.  No difficulty breathing.  No real complaints of dysphagia.     Objective: Vital signs in last 24 hours: Temp:  [97.6 F (36.4 C)-98.4 F (36.9 C)] 97.8 F (36.6 C) (06/06 0718) Pulse Rate:  [62-86] 62 (06/06 0718) Resp:  [7-20] 16 (06/06 0718) BP: (99-138)/(58-93) 99/64 (06/06 0718) SpO2:  [92 %-98 %] 97 % (06/06 0718) Weight:  [165 lb (74.8 kg)] 165 lb (74.8 kg) (06/06 0806)  Intake/Output from previous day: 06/05 0701 - 06/06 0700 In: 1363 [P.O.:360; I.V.:1003] Out: 55 [Drains:5; Blood:50] Intake/Output this shift: No intake/output data recorded.  No results for input(s): HGB in the last 72 hours. No results for input(s): WBC, RBC, HCT, PLT in the last 72 hours. No results for input(s): NA, K, CL, CO2, BUN, CREATININE, GLUCOSE, CALCIUM in the last 72 hours. No results for input(s): LABPT, INR in the last 72 hours.  Exam Pleasant white male alert and oriented in no acute distress.  Hemovac drain removed.  Surgical incision looks good.  No drainage.  No much by the way of neck swelling.  Neurovascular intact.  No focal motor deficits.      Assessment/Plan: I asked charge nurse today to look into why nurse did not give patient Dilaudid IV last night as I ordered.  We will plan for him to discharge home this afternoon.  Percocet and Robaxin on chart.   Zonia KiefJames Angelisse Riso 11/24/2017, 11:31 AM

## 2017-11-24 NOTE — Anesthesia Postprocedure Evaluation (Signed)
Anesthesia Post Note  Patient: Cristian Walker  Procedure(s) Performed: C4-5 ANTERIOR CERVICAL DECOMPRESSION/DISCECTOMY FUSION, ALLOGRAFT, PLATE (N/A Neck)     Patient location during evaluation: PACU Anesthesia Type: General Level of consciousness: awake Pain management: pain level controlled Vital Signs Assessment: post-procedure vital signs reviewed and stable Respiratory status: spontaneous breathing Cardiovascular status: stable Postop Assessment: no apparent nausea or vomiting Anesthetic complications: no    Last Vitals:  Vitals:   11/24/17 0718 11/24/17 1158  BP: 99/64 124/73  Pulse: 62 66  Resp: 16 16  Temp: 36.6 C 36.6 C  SpO2: 97% 95%    Last Pain:  Vitals:   11/24/17 1158  TempSrc: Oral  PainSc:    Pain Goal: Patients Stated Pain Goal: 3 (11/24/17 0343)               Kaydence Baba JR,JOHN Susann GivensFRANKLIN

## 2017-11-24 NOTE — Progress Notes (Signed)
Patient is discharged from room 3C10 at this time. Alert and in stable condition. IV site d/c'd and instructions read to patient and spouse with understanding verbalized. Left unit via wheelchair with all belongings at side.  

## 2017-11-28 NOTE — Op Note (Signed)
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  Postop diagnosis: Same  Procedure: C4-5 anterior cervical discectomy and fusion, allograft and plate  Surgeon: Annell GreeningMark Lorence Nagengast, MD  Assistant: Zonia KiefJames Owens, PA-C medically necessary and present for the entire procedure  Anesthesia: General oral tracheal  Drains: One Hemovac neck  Implants: Depuy  Skyline plate with 14 mm screws in C3 and 12 mm screws in  C4. VG 2 cortical cancellous allograft  At C4-5  Procedure: After standard prepping draping orotracheal intubation head halter traction application without weights arms tucked at the side with yellow pads neck was prepped with DuraPrep.  Timeout procedure was performed while the DuraPrep was drying.  Area squared with towel sterile skin marker and the planned incision Cristian Walker from midline extending to the left Betadine Steri-Drape sterile male standard the head and thyroid sheets and drapes.  Patient had disc protrusion at C4-5 with radiculopathy on the left with positive nerve conduction velocities acute on chronic changes.  He had increased right side control and left side disc protrusion.  Spondylosis.  Incision was made starting at the midline platysma was divided in line with a skin incision.  Gelpi retractor placed blunt dissection down to the prominent spurs in the midline longus Coley was mobilized and short 25 needle with straight clamp was placed and crosstable lateral C arm used for documenting appropriate level.  A portion of the disc was removed for localization and then self-retaining retractors were placed teeth plates right and left mid plate cephalad caudad.  Discectomy was performed operative microscope was draped brought in and posterior portion of the longitudinal ligament was removed spurs removed on the right and left side.  There is central disc protrusion and more disc protrusion on the left than right.  Uncovertebral joints were stripped with the Claritin curettes.  Complete  decompression of the dura was performed trial sizers were used and a 6 mm VG2 graft was inserted and countersunk 1 to 10 mm with traction pulled by the CRNA.  Plate was selected checked under C arm.  14 mm screws were used and on imaging it appeared that the inferior screws were almost bicortical.  We remove those and selected 12 mm screws which rescue screws which gave tight fit.  Final spot pictures were obtained.  Operative field was dry epidural space was dry.  Hemovac was placed in a knot technique standard layered closure platysma 3-0 Vicryl 4-0 Vicryl closure tincture benzoin Steri-Strips Marcaine infiltration 6 cc in the skin for postoperative analgesia soft collar after soft dressing.  Patient tolerated procedure well was transferred to recovery room in stable condition.

## 2017-12-02 ENCOUNTER — Ambulatory Visit (INDEPENDENT_AMBULATORY_CARE_PROVIDER_SITE_OTHER): Payer: 59

## 2017-12-02 ENCOUNTER — Ambulatory Visit (INDEPENDENT_AMBULATORY_CARE_PROVIDER_SITE_OTHER): Payer: 59 | Admitting: Orthopaedic Surgery

## 2017-12-02 ENCOUNTER — Encounter (INDEPENDENT_AMBULATORY_CARE_PROVIDER_SITE_OTHER): Payer: Self-pay | Admitting: Orthopaedic Surgery

## 2017-12-02 VITALS — BP 143/98 | HR 99 | Ht 67.0 in | Wt 170.0 lb

## 2017-12-02 DIAGNOSIS — Z981 Arthrodesis status: Secondary | ICD-10-CM

## 2017-12-02 NOTE — Progress Notes (Signed)
Post-Op Visit Note   Patient: Cristian Walker           Date of Birth: 06/15/1981           MRN: [id number] Visit Date: 12/02/2017 PCP: Jac Canavanysinger, David S, PA-C   Assessment & Plan: Post single level cervical fusion patient states his left arm as well as his right shoulder feels much better.  Incision was good 2 view x-rays show good position alignment.  He is happy with the surgical result and I will recheck him in 5 weeks for lateral flexion-extension C-spine x-ray out of collar.  Chief Complaint:  Chief Complaint  Patient presents with  . Neck - Routine Post Op   Visit Diagnoses:  1. Status post cervical spinal fusion     Plan: Return 5 weeks for lateral flexion-extension C-spine x-ray out of collar.  He can wean off his pain medication.  We had a discussion about this with some of the pain medication is had in the past.  Follow-Up Instructions: Return in about 5 weeks (around 01/06/2018).   Orders:  Orders Placed This Encounter  Procedures  . XR Cervical Spine 2 or 3 views   No orders of the defined types were placed in this encounter.   Imaging: Xr Cervical Spine 2 Or 3 Views  Result Date: 12/02/2017 AP lateral cervical spine x-rays obtained.  This shows single level C4-5 fusion with satisfactory position of plate and graft.  Normal pre-vertebral swelling is noted. Impression satisfactory C4-5 fusion.   PMFS History: Patient Active Problem List   Diagnosis Date Noted  . Cervical spinal stenosis 11/23/2017  . Wound dehiscence 10/21/2017  . Cubital tunnel syndrome on right 10/05/2017  . Protrusion of cervical intervertebral disc 10/05/2017  . Radicular pain in right arm 08/16/2017  . Neck pain 08/16/2017  . Right arm pain 08/16/2017  . Chronic right shoulder pain 08/16/2017  . Acute non-recurrent frontal sinusitis 03/29/2017  . Cough 03/29/2017  . Encounter for health maintenance examination in adult 03/04/2016  . History of pneumothorax 03/04/2016  . Elevated  blood pressure reading without diagnosis of hypertension 03/04/2016  . Mixed dyslipidemia 03/04/2016   Past Medical History:  Diagnosis Date  . Elevated blood pressure   . HNP (herniated nucleus pulposus), cervical    C4-5  . Hyperlipidemia   . Impaired fasting blood sugar   . Smoking   . Spontaneous pneumothorax    3 times, 2009, 2007, 2005    Family History  Problem Relation Age of Onset  . Hypertension Mother   . Heart disease Father 50       stent  . Alcohol abuse Father   . Cancer Maternal Grandfather        lung, asbestos    Past Surgical History:  Procedure Laterality Date  . ANTERIOR CERVICAL DECOMP/DISCECTOMY FUSION N/A 11/23/2017   Procedure: C4-5 ANTERIOR CERVICAL DECOMPRESSION/DISCECTOMY FUSION, ALLOGRAFT, PLATE;  Surgeon: Eldred MangesYates, Kennidee Heyne C, MD;  Location: [facility] OR;  Service: Orthopedics;  Laterality: N/A;  . HERNIA REPAIR     Right Inguinal  . LUNG SURGERY  2009, 2007, 2005   spontaneous pneumothorax x 3 surgeries   Social History   Occupational History  . Not on file  Tobacco Use  . Smoking status: Former Smoker    Years: 0.50  . Smokeless tobacco: Never Used  Substance and Sexual Activity  . Alcohol use: No  . Drug use: No  . Sexual activity: Yes    Partners: Female

## 2017-12-05 ENCOUNTER — Telehealth: Payer: Self-pay | Admitting: Medical

## 2017-12-05 NOTE — Telephone Encounter (Signed)
Pt requesting meds to help him calm his nerves. He states that his nerves are on edge due to being on Oxycodone pain meds from his recent cervical spine surgery. These pain meds historically make his react this way and per pt Cristian Walker has had to help him in the past. Pt is not able to move around much now and not able to drive now so has limited availability to come in for appointments.

## 2017-12-06 ENCOUNTER — Telehealth (INDEPENDENT_AMBULATORY_CARE_PROVIDER_SITE_OTHER): Payer: Self-pay | Admitting: Orthopaedic Surgery

## 2017-12-06 ENCOUNTER — Other Ambulatory Visit: Payer: Self-pay | Admitting: Medical

## 2017-12-06 MED ORDER — ALPRAZOLAM 0.5 MG PO TABS: ORAL_TABLET | ORAL | 0 refills | Status: DC

## 2017-12-06 NOTE — Telephone Encounter (Signed)
Done, med sent

## 2017-12-06 NOTE — Telephone Encounter (Signed)
Ok thx.

## 2017-12-06 NOTE — Telephone Encounter (Signed)
Ok for note 

## 2017-12-06 NOTE — Telephone Encounter (Signed)
Patient called [facility] that [facility] and he was wondering if he could possibly have an excuse since he is in a neck brace and isn't suppose to be driving. CB # [phone]

## 2017-12-07 NOTE — Discharge Summary (Signed)
Patient ID: CAETANO OBERHAUS MRN: [id number] DOB/AGE: [birth year] 37 y.o.  Admit date: 11/23/2017 Discharge date: 12/07/2017  Admission Diagnoses:  Active Problems:   Cervical spinal stenosis   Discharge Diagnoses:  Active Problems:   Cervical spinal stenosis  status post Procedure(s): C4-5 ANTERIOR CERVICAL DECOMPRESSION/DISCECTOMY FUSION, ALLOGRAFT, PLATE  Past Medical History:  Diagnosis Date  . Elevated blood pressure   . HNP (herniated nucleus pulposus), cervical    C4-5  . Hyperlipidemia   . Impaired fasting blood sugar   . Smoking   . Spontaneous pneumothorax    3 times, 2009, 2007, 2005    Surgeries: Procedure(s): C4-5 ANTERIOR CERVICAL DECOMPRESSION/DISCECTOMY FUSION, Jena Gauss, PLATE on 09/25/8293   Consultants:   Discharged Condition: Improved  Hospital Course: IZIAH CATES is an 37 y.o. male who was admitted 11/23/2017 for operative treatment of cervical stenosis. Patient failed conservative treatments (please see the history and physical for the specifics) and had severe unremitting pain that affects sleep, daily activities and work/hobbies. After pre-op clearance, the patient was taken to the operating room on 11/23/2017 and underwent  Procedure(s): C4-5 ANTERIOR CERVICAL DECOMPRESSION/DISCECTOMY FUSION, ALLOGRAFT, PLATE.    Patient was given perioperative antibiotics:  Anti-infectives (From admission, onward)   Start     Dose/Rate Route Frequency Ordered Stop   11/24/17 0600  ceFAZolin (ANCEF) IVPB 2g/100 mL premix     2 g 200 mL/hr over 30 Minutes Intravenous On call to O.R. 11/23/17 1332 11/23/17 1541   11/23/17 2200  ceFAZolin (ANCEF) IVPB 1 g/50 mL premix     1 g 100 mL/hr over 30 Minutes Intravenous Every 8 hours 11/23/17 1808 11/24/17 0530   11/23/17 1335  ceFAZolin (ANCEF) 2-4 GM/100ML-% IVPB    Note to Pharmacy:  Domenica Fail   : cabinet override      11/23/17 1335 11/23/17 1541       Patient was given sequential compression devices and early  ambulation to prevent DVT.   Patient benefited maximally from hospital stay and there were no complications. At the time of discharge, the patient was urinating/moving their bowels without difficulty, tolerating a regular diet, pain is controlled with oral pain medications and they have been cleared by PT/OT.   Recent vital signs: No data found.   Recent laboratory studies: No results for input(s): WBC, HGB, HCT, PLT, NA, K, CL, CO2, BUN, CREATININE, GLUCOSE, INR, CALCIUM in the last 72 hours.  Invalid input(s): PT, 2   Discharge Medications:   Allergies as of 11/24/2017   No Known Allergies     Medication List    STOP taking these medications   aspirin-acetaminophen-caffeine 250-250-65 MG tablet Commonly known as:  EXCEDRIN MIGRAINE   cephALEXin 500 MG capsule Commonly known as:  KEFLEX   HYDROcodone-acetaminophen 10-325 MG tablet Commonly known as:  NORCO     TAKE these medications   methocarbamol 500 MG tablet Commonly known as:  ROBAXIN Take 1 tablet (500 mg total) by mouth every 6 (six) hours as needed for muscle spasms. What changed:    when to take this  reasons to take this   oxyCODONE-acetaminophen 7.5-325 MG tablet Commonly known as:  PERCOCET Take 1 tablet by mouth every 6 (six) hours as needed for severe pain.   pravastatin 20 MG tablet Commonly known as:  PRAVACHOL Take 1 tablet (20 mg total) by mouth every evening. What changed:  additional instructions   quinapril 10 MG tablet Commonly known as:  ACCUPRIL Take 1 tablet (10 mg total) by mouth  daily.       Diagnostic Studies: Dg Cervical Spine 2-3 Views  Result Date: 11/23/2017 CLINICAL DATA:  C4-C5 ACDF. EXAM: DG C-ARM 61-120 MIN; CERVICAL SPINE - 2-3 VIEW COMPARISON:  MRI cervical spine dated September 04, 2017. FINDINGS: AP and lateral intraoperative x-rays demonstrate interval C4-C5 ACDF with interbody graft. Alignment is normal. No acute abnormality. IMPRESSION: Interval C4-C5 ACDF. FLUOROSCOPY  TIME:  16 seconds. C-arm fluoroscopic images were obtained intraoperatively and submitted for post operative interpretation. Electronically Signed   By: Obie DredgeWilliam T Derry M.D.   On: 11/23/2017 18:51   Dg C-arm 1-60 Min  Result Date: 11/23/2017 CLINICAL DATA:  C4-C5 ACDF. EXAM: DG C-ARM 61-120 MIN; CERVICAL SPINE - 2-3 VIEW COMPARISON:  MRI cervical spine dated September 04, 2017. FINDINGS: AP and lateral intraoperative x-rays demonstrate interval C4-C5 ACDF with interbody graft. Alignment is normal. No acute abnormality. IMPRESSION: Interval C4-C5 ACDF. FLUOROSCOPY TIME:  16 seconds. C-arm fluoroscopic images were obtained intraoperatively and submitted for post operative interpretation. Electronically Signed   By: Obie DredgeWilliam T Derry M.D.   On: 11/23/2017 18:51   Xr Cervical Spine 2 Or 3 Views  Result Date: 12/02/2017 AP lateral cervical spine x-rays obtained.  This shows single level C4-5 fusion with satisfactory position of plate and graft.  Normal pre-vertebral swelling is noted. Impression satisfactory C4-5 fusion.     Follow-up Information    Schedule an appointment as soon as possible for a visit with Eldred MangesYates, Mark C, MD.   Specialty:  Orthopedic Surgery Why:  with dr Ophelia Charteryates one week postop Contact information: [address] [place] [address] [phone]           Discharge Plan:  discharge to home  Disposition:     Signed: Zonia KiefJames Mandela Bello  12/07/2017, 4:07 PM

## 2017-12-07 NOTE — Telephone Encounter (Signed)
Note at front for pick up. Patient advised. 

## 2017-12-28 ENCOUNTER — Ambulatory Visit (INDEPENDENT_AMBULATORY_CARE_PROVIDER_SITE_OTHER): Payer: 59 | Admitting: Medical

## 2017-12-28 ENCOUNTER — Encounter: Payer: Self-pay | Admitting: Medical

## 2017-12-28 VITALS — BP 126/84 | HR 98 | Temp 98.0°F | Wt 169.2 lb

## 2017-12-28 DIAGNOSIS — E782 Mixed hyperlipidemia: Secondary | ICD-10-CM

## 2017-12-28 DIAGNOSIS — M4802 Spinal stenosis, cervical region: Secondary | ICD-10-CM

## 2017-12-28 DIAGNOSIS — I10 Essential (primary) hypertension: Secondary | ICD-10-CM | POA: Insufficient documentation

## 2017-12-28 MED ORDER — QUINAPRIL HCL 10 MG PO TABS: 10.0000 mg | ORAL_TABLET | Freq: Every day | ORAL | 1 refills | Status: DC

## 2017-12-28 MED ORDER — PRAVASTATIN SODIUM 20 MG PO TABS: 20.0000 mg | ORAL_TABLET | Freq: Every evening | ORAL | 1 refills | Status: DC

## 2017-12-28 NOTE — Progress Notes (Signed)
Subjective: Chief Complaint  Patient presents with  . other    follow up on new b/p med    Here for recheck and follow-up on starting high blood pressure and cholesterol medicine since last visit.  Here with wife. Compliant with medication without complaint.  Home  blood pressures been looking fine.  He is non fasting today.  Recently had cervical spine fusion, and lately started to have more right shoulder pain again.  Is frustrated that when he sees orthopedist, feels rushed like he can't ask questions.    Past Medical History:  Diagnosis Date  . Elevated blood pressure   . HNP (herniated nucleus pulposus), cervical    C4-5  . Hyperlipidemia   . Impaired fasting blood sugar   . Smoking   . Spontaneous pneumothorax    3 times, 2009, 2007, 2005   Past Surgical History:  Procedure Laterality Date  . ANTERIOR CERVICAL DECOMP/DISCECTOMY FUSION N/A 11/23/2017   Procedure: C4-5 ANTERIOR CERVICAL DECOMPRESSION/DISCECTOMY FUSION, ALLOGRAFT, PLATE;  Surgeon: Eldred MangesYates, Mark C, MD;  Location: [facility] OR;  Service: Orthopedics;  Laterality: N/A;  . HERNIA REPAIR     Right Inguinal  . LUNG SURGERY  2009, 2007, 2005   spontaneous pneumothorax x 3 surgeries     Current Outpatient Medications on File Prior to Visit  Medication Sig Dispense Refill  . ALPRAZolam (XANAX) 0.5 MG tablet 1 tablet every 8 - 12 hours (Patient not taking: Reported on 12/28/2017) 15 tablet 0  . methocarbamol (ROBAXIN) 500 MG tablet Take 1 tablet (500 mg total) by mouth every 6 (six) hours as needed for muscle spasms. (Patient not taking: Reported on 12/28/2017) 60 tablet 0  . oxyCODONE-acetaminophen (PERCOCET) 7.5-325 MG tablet Take 1 tablet by mouth every 6 (six) hours as needed for severe pain. (Patient not taking: Reported on 12/28/2017) 50 tablet 0   No current facility-administered medications on file prior to visit.    ROS as in subjective   Objective:  BP 126/84 (BP Location: Right Arm, Patient Position: Sitting)    Pulse 98   Temp 98 F (36.7 C)   Wt 169 lb 3.2 oz (76.7 kg)   SpO2 98%   BMI 26.50 kg/m   BP Readings from Last 3 Encounters:  12/28/17 126/84  12/02/17 (!) 143/98  11/24/17 124/73   General appearance: alert, no distress, WD/WN Neck: cervical soft collar in place Heart: RRR, normal S1, S2, no murmurs Lungs: CTA bilaterally, no wheezes, rhonchi, or rales Pulses: 2+ symmetric, upper and lower extremities, normal cap refill    Assessment: Encounter Diagnoses  Name Primary?  . Essential hypertension, benign   . Mixed dyslipidemia Yes  . Cervical spinal stenosis      Plan HTN - at goal, c/t current medication  dyslipidemia - plan for fasting lipid next visit  cervical spinal stenosis, recent cervical fusion - f/u with orthopedics  Cristian NeedleMichael was seen today for other.  Diagnoses and all orders for this visit:  Mixed dyslipidemia  Essential hypertension, benign  Cervical spinal stenosis  Other orders -     quinapril (ACCUPRIL) 10 MG tablet; Take 1 tablet (10 mg total) by mouth daily. -     pravastatin (PRAVACHOL) 20 MG tablet; Take 1 tablet (20 mg total) by mouth every evening.

## 2018-01-03 ENCOUNTER — Ambulatory Visit (INDEPENDENT_AMBULATORY_CARE_PROVIDER_SITE_OTHER): Payer: Self-pay

## 2018-01-03 ENCOUNTER — Ambulatory Visit (INDEPENDENT_AMBULATORY_CARE_PROVIDER_SITE_OTHER): Payer: 59 | Admitting: Orthopaedic Surgery

## 2018-01-03 ENCOUNTER — Encounter (INDEPENDENT_AMBULATORY_CARE_PROVIDER_SITE_OTHER): Payer: Self-pay | Admitting: Orthopaedic Surgery

## 2018-01-03 VITALS — BP 151/113 | HR 97 | Ht 67.0 in | Wt 165.0 lb

## 2018-01-03 DIAGNOSIS — M62838 Other muscle spasm: Secondary | ICD-10-CM

## 2018-01-03 DIAGNOSIS — Z981 Arthrodesis status: Secondary | ICD-10-CM

## 2018-01-03 MED ORDER — HYDROCODONE-ACETAMINOPHEN 5-325 MG PO TABS: 1.0000 | ORAL_TABLET | Freq: Three times a day (TID) | ORAL | 0 refills | Status: DC | PRN

## 2018-01-03 MED ORDER — METHOCARBAMOL 500 MG PO TABS: 500.0000 mg | ORAL_TABLET | Freq: Four times a day (QID) | ORAL | 0 refills | Status: DC

## 2018-01-03 NOTE — Progress Notes (Signed)
Post-Op Visit Note   Patient: Cristian Walker           Date of Birth: [birth year]           MRN: [id number] Visit Date: 01/03/2018 PCP: Jac Canavan, PA-C   Assessment & Plan:  Chief Complaint:  Chief Complaint  Patient presents with  . Neck - Routine Post Op  37 year old white male who is about 6-week status post C4-5 ACDF returns.  States that he has been having increased right-sided trapezius muscle spasm with some feeling of shoulder stiffness.  Has also been having some occipital headaches.  No radicular pain down his arm.   Visit Diagnoses:  1. Status post cervical spinal fusion     Plan: Advised patient that I think patient issue that he is having is related to right-sided neck/trapezius spasm.  He will discontinue his cervical collar.  I reviewed x-rays with patient and Dr. Ophelia Charter.  Given final prescriptions for Norco for pain and Robaxin for spasms.  We will send him to formal PT to see if they can help loosen up his neck and shoulder some.  Toradol 30 mg IM injection today. Follow up with Dr Ophelia Charter in 4 weeks for recheck and to discuss return to work status.    Follow-Up Instructions: Return in about 1 month (around 01/31/2018) for with Dr Ophelia Charter for final check.   Orders:  Orders Placed This Encounter  Procedures  . XR Cervical Spine 2 or 3 views   Meds ordered this encounter  Medications  . methocarbamol (ROBAXIN) 500 MG tablet    Sig: Take 1 tablet (500 mg total) by mouth 4 (four) times daily.    Dispense:  40 tablet    Refill:  0  . HYDROcodone-acetaminophen (NORCO/VICODIN) 5-325 MG tablet    Sig: Take 1 tablet by mouth every 8 (eight) hours as needed for moderate pain.    Dispense:  40 tablet    Refill:  0    Imaging: No results found.  PMFS History: Patient Active Problem List   Diagnosis Date Noted  . Essential hypertension, benign 12/28/2017  . Cervical spinal stenosis 11/23/2017  . Wound dehiscence 10/21/2017  . Cubital tunnel syndrome on  right 10/05/2017  . Protrusion of cervical intervertebral disc 10/05/2017  . Radicular pain in right arm 08/16/2017  . Neck pain 08/16/2017  . Right arm pain 08/16/2017  . Chronic right shoulder pain 08/16/2017  . Acute non-recurrent frontal sinusitis 03/29/2017  . Cough 03/29/2017  . Encounter for health maintenance examination in adult 03/04/2016  . History of pneumothorax 03/04/2016  . Mixed dyslipidemia 03/04/2016   Past Medical History:  Diagnosis Date  . Elevated blood pressure   . HNP (herniated nucleus pulposus), cervical    C4-5  . Hyperlipidemia   . Impaired fasting blood sugar   . Smoking   . Spontaneous pneumothorax    3 times, 2009, 2007, 2005    Family History  Problem Relation Age of Onset  . Hypertension Mother   . Heart disease Father 50       stent  . Alcohol abuse Father   . Cancer Maternal Grandfather        lung, asbestos    Past Surgical History:  Procedure Laterality Date  . ANTERIOR CERVICAL DECOMP/DISCECTOMY FUSION N/A 11/23/2017   Procedure: C4-5 ANTERIOR CERVICAL DECOMPRESSION/DISCECTOMY FUSION, ALLOGRAFT, PLATE;  Surgeon: Eldred Manges, MD;  Location: [facility] OR;  Service: Orthopedics;  Laterality: N/A;  . HERNIA REPAIR  Right Inguinal  . LUNG SURGERY  2009, 2007, 2005   spontaneous pneumothorax x 3 surgeries   Social History   Occupational History  . Not on file  Tobacco Use  . Smoking status: Former Smoker    Years: 0.50  . Smokeless tobacco: Never Used  Substance and Sexual Activity  . Alcohol use: No  . Drug use: No  . Sexual activity: Yes    Partners: Female   Exam Patient has moderate to marked right sided trapezius spasm and tenderness.  Decrease cervical spine range of motion due to right-sided spasm.  Neurovascular intact.

## 2018-01-05 ENCOUNTER — Encounter: Payer: Self-pay | Admitting: Physical Therapy

## 2018-01-05 ENCOUNTER — Ambulatory Visit: Payer: 59 | Attending: Surgery | Admitting: Physical Therapy

## 2018-01-05 ENCOUNTER — Other Ambulatory Visit: Payer: Self-pay

## 2018-01-05 DIAGNOSIS — M542 Cervicalgia: Secondary | ICD-10-CM | POA: Diagnosis not present

## 2018-01-05 DIAGNOSIS — R293 Abnormal posture: Secondary | ICD-10-CM | POA: Insufficient documentation

## 2018-01-05 DIAGNOSIS — R252 Cramp and spasm: Secondary | ICD-10-CM | POA: Insufficient documentation

## 2018-01-05 DIAGNOSIS — M6281 Muscle weakness (generalized): Secondary | ICD-10-CM | POA: Insufficient documentation

## 2018-01-05 NOTE — Patient Instructions (Signed)
Trigger Point Dry Needling  . What is Trigger Point Dry Needling (DN)? o DN is a physical therapy technique used to treat muscle pain and dysfunction. Specifically, DN helps deactivate muscle trigger points (muscle knots).  o A thin filiform needle is used to penetrate the skin and stimulate the underlying trigger point. The goal is for a local twitch response (LTR) to occur and for the trigger point to relax. No medication of any kind is injected during the procedure.   . What Does Trigger Point Dry Needling Feel Like?  o The procedure feels different for each individual patient. Some patients report that they do not actually feel the needle enter the skin and overall the process is not painful. Very mild bleeding may occur. However, many patients feel a deep cramping in the muscle in which the needle was inserted. This is the local twitch response.   Marland Kitchen. How Will I feel after the treatment? o Soreness is normal, and the onset of soreness may not occur for a few hours. Typically this soreness does not last longer than two days.  o Bruising is uncommon, however; ice can be used to decrease any possible bruising.  o In rare cases feeling tired or nauseous after the treatment is normal. In addition, your symptoms may get worse before they get better, this period will typically not last longer than 24 hours.   . What Can I do After My Treatment? o Increase your hydration by drinking more water for the next 24 hours. o You may place ice or heat on the areas treated that have become sore, however, do not use heat on inflamed or bruised areas. Heat often brings more relief post needling. o You can continue your regular activities, but vigorous activity is not recommended initially after the treatment for 24 hours. o DN is best combined with other physical therapy such as strengthening, stretching, and other therapies.   Posture Tips DO: - stand tall and erect - keep chin tucked in - keep head and  shoulders in alignment - check posture regularly in mirror or large window - pull head back against headrest in car seat;  Change your position often.  Sit with lumbar support. DON'T: - slouch or slump while watching TV or reading - sit, stand or lie in one position  for too long;  Sitting is especially hard on the spine so if you sit at a desk/use the computer, then stand up often!   Copyright  VHI. All rights reserved.  Posture - Standing   Good posture is important. Avoid slouching and forward head thrust. Maintain curve in low back and align ears over shoul- ders, hips over ankles.  Pull your belly button in toward your back bone. Stand with knee , ribs lifted up and chin down.   Copyright  VHI. All rights reserved.  Posture - Sitting   Sit upright, head facing forward. Try using a roll to support lower back. Keep shoulders relaxed, and avoid rounded back. Keep hips level with knees. Avoid crossing legs for long periods.  Sit on you sit bone not tail bone.   Suboccipital Stretch (Supine)    With small towel roll at base of skull and upper neck. Gently tuck chin until stretch is felt at base of skull and upper neck. Hold _10___ seconds. Relax. Repeat ___10_ times per set. Do _1___ sets per session. Do __3__ sessions per day. No pillow lying down  Levator Stretch   Grasp seat or sit on  hand on side to be stretched. Turn head toward other side and look down. Use hand on head to gently stretch neck in that position. Hold _30___ seconds. Repeat on other side. Repeat __3__ times. Do _3___ sessions per day.  http://gt2.exer.us/30   Copyright  VHI. All rights reserved.  Side-Bending   One hand on opposite side of head, pull head to side as far as is comfortable. Stop if there is pain. Hold _30___ seconds. Repeat with other hand to other side. Repeat 3____ times. Do __3__ sessions per day.   Copyright  VHI. All rights reserved.  Scapular Retraction (Standing)   With arms at  sides, pinch shoulder blades together. Repeat _10___ times per set. Do _1___ sets per session. Do ___3_ sessions per day.  http://orth.exer.us/944   Copyright  VHI. All rights reserved.  Chin Protraction / Retraction   Copyright  VHI. All rights reserved.  Garen Lah, PT Certified Exercise Expert for the Aging Adult  01/05/18 9:25 AM Phone: [phone] Fax: [phone]

## 2018-01-05 NOTE — Therapy (Signed)
[facility] [address] [place], [place], [address] Phone: [phone]   Fax:  [phone]  Physical Therapy Evaluation  Patient Details  Name: Cristian Walker MRN: [id number] Date of Birth: 05/14/1981 Referring Provider: Annell GreeningYates, Mark MD   Encounter Date: 01/05/2018  PT End of Session - 01/05/18 0926    Visit Number  1    Number of Visits  12    Date for PT Re-Evaluation  02/16/18    Authorization Type  [employer]    PT Start Time  0835    PT Stop Time  0950    PT Time Calculation (min)  75 min    Activity Tolerance  Patient tolerated treatment well    Behavior During Therapy  [facility] assessed/performed       Past Medical History:  Diagnosis Date  . Elevated blood pressure   . HNP (herniated nucleus pulposus), cervical    C4-5  . Hyperlipidemia   . Impaired fasting blood sugar   . Smoking   . Spontaneous pneumothorax    3 times, 2009, 2007, 2005    Past Surgical History:  Procedure Laterality Date  . ANTERIOR CERVICAL DECOMP/DISCECTOMY FUSION N/A 11/23/2017   Procedure: C4-5 ANTERIOR CERVICAL DECOMPRESSION/DISCECTOMY FUSION, ALLOGRAFT, PLATE;  Surgeon: Eldred MangesYates, Mark C, MD;  Location: [facility] OR;  Service: Orthopedics;  Laterality: N/A;  . HERNIA REPAIR     Right Inguinal  . LUNG SURGERY  2009, 2007, 2005   spontaneous pneumothorax x 3 surgeries    There were no vitals filed for this visit.   Subjective Assessment - 01/05/18 0858    Subjective  I have had two surgeries this year.  right cubital tunnell syndrome 10-09-17 and on 11-23-17 had ACDF of neck. I have had my right UE go numb after sleeping 3 times since surgery. I cant get my neck to relax and I have intermittent headaches all day long     Pertinent History  bil pneumothorax.  several elbow pain, shoulder and neck pain. See history    Limitations  Sitting;Standing;Walking;House hold activities    How long can you sit comfortably?  <5 minutes    How long can you  stand comfortably?  < 5min    How long can you walk comfortably?  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     Currently in Pain?  Yes    Pain Score  6     Pain Location  Neck    Pain Orientation  Right    Pain Descriptors / Indicators  Spasm;Stabbing;Sharp    Pain Type  Surgical pain;Neuropathic pain;Acute pain    Pain Onset  More than a month ago 6 weeks    Pain Frequency  Intermittent    Aggravating Factors   sleeping on my right side, moving my head , driving,  standing for spasm pressure standing for 20 to 30 minutes  could not work    Multiple Pain Sites  Yes    Pain Score  6    Pain Location  Head    Pain Orientation  Right    Pain Descriptors / Indicators  Sharp;Shooting    Pain Type  Surgical pain;Neuropathic pain;Acute pain    Pain Onset  More than a month ago    Pain Frequency  Intermittent         [facility] PT Assessment - 01/05/18 0903      Assessment   Medical Diagnosis  post ACDF 6 weeks     Referring Provider  Annell Greening MD    Onset Date/Surgical Date  11/23/17 ACDF surgery    Hand Dominance  Right    Next MD Visit  January 31, 2018 followup    Prior Therapy  none      Precautions   Precautions  Other (comment)    Precaution Comments  neck 6 week post surgery  no forced end range motion      Restrictions   Weight Bearing Restrictions  No      Balance Screen   Has the patient fallen in the past 6 months  No    Has the patient had a decrease in activity level because of a fear of falling?   No    Is the patient reluctant to leave their home because of a fear of falling?   No      Home Public house manager residence    Living Arrangements  Spouse/significant other    Home Access  Stairs to enter    Entrance Stairs-Number of Steps  3    Entrance Stairs-Rails  Left    Home Layout  One level      Prior  Function   Level of Independence  Independent    Vocation  Full time employment    Vocation Requirements  works for [employer] and working /cutting pipes and Electrical engineer   Overall Cognitive Status  Within Functional Limits for tasks assessed      Observation/Other Assessments   Focus on Therapeutic Outcomes (FOTO)   Foto not taken due to internet down      Sensation   Light Touch  Appears Intact      Posture/Postural Control   Posture/Postural Control  Postural limitations    Postural Limitations  Forward head;Rounded Shoulders;Anterior pelvic tilt      ROM / Strength   AROM / PROM / Strength  AROM;Strength      AROM   Overall AROM   Deficits    Cervical Flexion  22    Cervical Extension  35    Cervical - Right Side Bend  18    Cervical - Left Side Bend  29    Cervical - Right Rotation  45    Cervical - Left Rotation  39      Strength   Overall Strength  Deficits    Overall Strength Comments  supine neck flexion unable to flex 1 inch from horizontal 0 sec  normal 35 seconds without SCM compensation    Right Shoulder Flexion  4+/5    Right Shoulder Extension  4+/5    Right Shoulder ABduction  4/5    Left Shoulder Flexion  4-/5    Left Shoulder Extension  4-/5    Left Shoulder ABduction  4-/5    Right Hand Grip (lbs)  74.3  73, 75, 75    Left Hand Grip (lbs)  69.6 72 72. 65      Palpation   Palpation comment  Pt with severe spasming of bil cervicals Right > Left over upper trap and levator                Objective measurements completed on examination: See above findings.  [facility] Adult PT Treatment/Exercise - 01/05/18 0903      Self-Care   Self-Care  Posture;Other Self-Care Comments    Posture  initial posture sitting and standing    Other Self-Care Comments   educated on TPDN precautians and aftercare      Modalities   Modalities  Moist Heat      Moist Heat Therapy   Number Minutes Moist Heat  15 Minutes     Moist Heat Location  Cervical      Manual Therapy   Manual Therapy  Soft tissue mobilization    Manual therapy comments  skilled palpation througtout session    Soft tissue mobilization  bil upper trapezius and levator and use of IASTYM tool      Neck Exercises: Stretches   Upper Trapezius Stretch  2 reps;30 seconds    Upper Trapezius Stretch Limitations  right and left demo    Levator Stretch  2 reps;30 seconds;Left;Right    Levator Stretch Limitations  demo to pt tolerance    Other Neck Stretches  supine suboccipital with chin tuch and towel under suboccipital s headache relieved    Other Neck Stretches  scapular retraction bil x 10       Trigger Point Dry Needling - 01/05/18 1023    Consent Given?  Yes    Education Handout Provided  Yes    Muscles Treated Upper Body  Upper trapezius;Levator scapulae right only    Upper Trapezius Response  Twitch reponse elicited;Palpable increased muscle length multiple localized twitch response    Levator Scapulae Response  Twitch response elicited;Palpable increased muscle length           PT Education - 01/05/18 0930    Education Details  POC Explanation of findings.  Education on TPDN, posture and initial HEP    Person(s) Educated  Patient    Methods  Explanation;Demonstration;Tactile cues;Verbal cues;Handout    Comprehension  Verbalized understanding;Returned demonstration       PT Short Term Goals - 01/05/18 1146      PT SHORT TERM GOAL #1   Title  STG=LTG        PT Long Term Goals - 01/05/18 1146      PT LONG TERM GOAL #1   Title  "Demonstrate and verbalize techniques to reduce the risk of re-injury including: lifting, posture, body mechanics.     Target Date  02/16/18      PT LONG TERM GOAL #2   Title  "Pt will be independent with advanced HEP.     Time  6    Period  Weeks    Status  New    Target Date  02/16/18      PT LONG TERM GOAL #3   Title  "Improved cervical rotation to allow checking blind spots while  driving with minimal discomfort    Time  6    Period  Weeks    Status  New    Target Date  02/16/18      PT LONG TERM GOAL #4   Title  "Pt will tolerate sitting 1 hour without increased pain without increased pain in order to perform work duties with [employer]    Time  6    Period  Weeks    Status  New    Target Date  02/16/18      PT LONG TERM GOAL #5   Title  Pt will be able to raise UE above head with pain level 2/10 or less  in 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    Baseline  multiple headaches intermittent since ACDF surgery    Time  6    Period  Weeks    Status  New    Target Date  02/16/18             Plan - 01/05/18 1152    Clinical Impression Statement  37 yo presents with severe upper trap/levator spasm R > L post 6 weeks after ACDF on 11-23-17.  Pt also under went surger on right elbow for cubital tunnel syndrome on 10-09-17.  Pt has been  having multiple headaches and intermittent to constant spams in neck causing tension headaches since surger on neck.  Pt also has has 3 events of complete numbess of right UE after waking in the morning.  Pt recently stopped using soft collar and can do AROM to tolerance.  Pt consented to TPDN and was closely monitored througthout session. Pt given initial HEP and pt reported no headache and neckpain reduced to 2/10 Pt would benefit from skilled PT for 2 x a week for 6 weeks to address impairments    History and Personal Factors relevant to plan of care:  spontaneous pneumothorax hx  of, muliple surgeries. PRecautians. NO forced AROM of neck    Clinical Presentation  Stable    Clinical Decision Making  Low    Rehab Potential  Good    PT Frequency  2x / week    PT Duration  6 weeks    PT Treatment/Interventions  ADLs/Self Care Home Management;Cryotherapy;Electrical Stimulation;Iontophoresis 4mg /ml Dexamethasone;Moist Heat;Ultrasound;Therapeutic exercise;Therapeutic activities;Patient/family education;Neuromuscular re-education;Passive range of motion;Dry needling;Taping;Manual techniques    PT Next Visit Plan  reveiwe HEP, manual and modalites TPDN assess    PT Home Exercise Plan  intiial stretches    Consulted and Agree with Plan of Care  Patient       Patient will benefit from skilled therapeutic intervention in order to improve the following deficits and impairments:  Pain, Improper body mechanics, Postural dysfunction, Impaired UE functional use, Decreased range of motion, Decreased strength  Visit Diagnosis: Cervicalgia  Cramp and spasm  Muscle weakness (generalized)  Abnormal posture     Problem List Patient Active Problem List   Diagnosis Date Noted  . Essential hypertension, benign 12/28/2017  . Cervical spinal stenosis 11/23/2017  . Wound dehiscence 10/21/2017  . Cubital tunnel syndrome on right 10/05/2017  . Protrusion of cervical intervertebral disc 10/05/2017  . Radicular pain in right arm 08/16/2017  . Neck pain 08/16/2017  . Right arm pain 08/16/2017  . Chronic right shoulder pain 08/16/2017  . Acute non-recurrent frontal sinusitis 03/29/2017  . Cough 03/29/2017  . Encounter for health maintenance examination in adult 03/04/2016  . History  of pneumothorax 03/04/2016  . Mixed dyslipidemia 03/04/2016    Garen Lah, PT Certified Exercise Expert for the Aging Adult  01/05/18 12:02 PM Phone: [phone] Fax: [phone]  [facility] [address] [place], [place],  [address] Phone: [phone]   Fax:  [phone]  Name: Cristian Walker MRN: [id number] Date of Birth: [birth year]

## 2018-01-06 ENCOUNTER — Ambulatory Visit (INDEPENDENT_AMBULATORY_CARE_PROVIDER_SITE_OTHER): Payer: 59 | Admitting: Orthopaedic Surgery

## 2018-01-11 ENCOUNTER — Other Ambulatory Visit: Payer: Self-pay | Admitting: Medical

## 2018-01-12 ENCOUNTER — Encounter: Payer: Self-pay | Admitting: Physical Therapy

## 2018-01-12 ENCOUNTER — Ambulatory Visit: Payer: 59 | Admitting: Physical Therapy

## 2018-01-12 DIAGNOSIS — M6281 Muscle weakness (generalized): Secondary | ICD-10-CM

## 2018-01-12 DIAGNOSIS — R293 Abnormal posture: Secondary | ICD-10-CM

## 2018-01-12 DIAGNOSIS — M542 Cervicalgia: Secondary | ICD-10-CM

## 2018-01-12 DIAGNOSIS — R252 Cramp and spasm: Secondary | ICD-10-CM

## 2018-01-12 MED ORDER — ALPRAZOLAM 0.5 MG PO TABS: ORAL_TABLET | ORAL | 0 refills | Status: DC

## 2018-01-12 NOTE — Telephone Encounter (Signed)
Is this ok to refill?  

## 2018-01-12 NOTE — Therapy (Signed)
[facility] [address] [place], [place], [address] Phone: [phone]   Fax:  [phone]  Physical Therapy Treatment  Patient Details  Name: FELDER LEBEDA MRN: [id number] Date of Birth: May 22, 1981 Referring Provider: Annell Greening MD   Encounter Date: 01/12/2018  PT End of Session - 01/12/18 0856    Visit Number  2    Number of Visits  12    Date for PT Re-Evaluation  02/16/18    PT Start Time  0847    PT Stop Time  0945    PT Time Calculation (min)  58 min    Activity Tolerance  Patient tolerated treatment well    Behavior During Therapy  [facility] for tasks assessed/performed       Past Medical History:  Diagnosis Date  . Elevated blood pressure   . HNP (herniated nucleus pulposus), cervical    C4-5  . Hyperlipidemia   . Impaired fasting blood sugar   . Smoking   . Spontaneous pneumothorax    3 times, 2009, 2007, 2005    Past Surgical History:  Procedure Laterality Date  . ANTERIOR CERVICAL DECOMP/DISCECTOMY FUSION N/A 11/23/2017   Procedure: C4-5 ANTERIOR CERVICAL DECOMPRESSION/DISCECTOMY FUSION, ALLOGRAFT, PLATE;  Surgeon: Eldred Manges, MD;  Location: [facility] OR;  Service: Orthopedics;  Laterality: N/A;  . HERNIA REPAIR     Right Inguinal  . LUNG SURGERY  2009, 2007, 2005   spontaneous pneumothorax x 3 surgeries    There were no vitals filed for this visit.  Subjective Assessment - 01/12/18 0850    Subjective  I have more AROM of my neck after the TPDN and feel less stiff.  I still have the same amount of headaches. Sometimes it feels like it is right sided pressure rounding into my head and eye. My eye even waters like I have tears    Pertinent History  bil pneumothorax.  several elbow pain, shoulder and neck pain. 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     Currently in Pain?   Yes    Pain Score  3     Pain Location  Neck    Pain Orientation  Right    Pain Descriptors / Indicators  Pressure    Pain Type  Surgical pain;Neuropathic pain    Pain Onset  More than a month ago    Pain Frequency  Intermittent    Pain Score  3    Pain Location  Head    Pain Orientation  Right    Pain Descriptors / Indicators  Pressure migraine    Pain Type  Surgical pain;Neuropathic pain    Pain Onset  More than a month ago    Pain Frequency  Intermittent                       [facility] Adult PT Treatment/Exercise - 01/12/18 0900      Shoulder Exercises: Supine   Other Supine Exercises  sub scapular stabilizers 15 x 2 bil shoulder flex, horizontal abd, diagponals, Bil ER      Moist Heat Therapy   Number Minutes Moist Heat  15 Minutes    Moist Heat Location  Cervical      Manual Therapy   Manual Therapy  Manual Traction;Soft tissue mobilization  Manual therapy comments  skilled palpation with TPDN    Soft tissue mobilization  bil upper trapezius and levator and use of IASTYM tool also sub occipital and obliquus capitus       Trigger Point Dry Needling - 01/12/18 0912    Consent Given?  Yes    Education Handout Provided  No previously given    Muscles Treated Upper Body  Upper trapezius;Oblique capitus;Suboccipitals muscle group;Levator scapulae;Rhomboids right side only    Upper Trapezius Response  Twitch reponse elicited;Palpable increased muscle length    Oblique Capitus Response  Twitch response elicited;Palpable increased muscle length marked tenderness    SubOccipitals Response  Twitch response elicited;Palpable increased muscle length marked tenderness    Levator Scapulae Response  Twitch response elicited;Palpable increased muscle length           PT Education - 01/12/18 0855    Education Details  added Supine scapular stabilizers to HEP    Person(s) Educated  Patient    Methods  Explanation;Demonstration;Tactile cues;Verbal cues     Comprehension  Verbalized understanding;Returned demonstration       PT Short Term Goals - 01/05/18 1146      PT SHORT TERM GOAL #1   Title  STG=LTG        PT Long Term Goals - 01/05/18 1146      PT LONG TERM GOAL #1   Title  "Demonstrate and verbalize techniques to reduce the risk of re-injury including: lifting, posture, body mechanics.     Target Date  02/16/18      PT LONG TERM GOAL #2   Title  "Pt will be independent with advanced HEP.     Time  6    Period  Weeks    Status  New    Target Date  02/16/18      PT LONG TERM GOAL #3   Title  "Improved cervical rotation to allow checking blind spots while driving with minimal discomfort    Time  6    Period  Weeks    Status  New    Target Date  02/16/18      PT LONG TERM GOAL #4   Title  "Pt will tolerate sitting 1 hour without increased pain without increased pain in order to perform work duties with [employer]    Time  6    Period  Weeks    Status  New    Target Date  02/16/18      PT LONG TERM GOAL #5   Title  Pt will be able to raise UE above head with pain level 2/10 or less in 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    Baseline  multiple headaches intermittent since ACDF surgery  Time  6    Period  Weeks    Status  New    Target Date  02/16/18            Plan - 01/12/18 0930    Clinical Impression Statement  pt returns to clinic and he is reduced to 3/10 pain in right side neck and has marked tenderness over right sub occipital and obiquus capitus.  Pt given HEP for supine scapular stabilziers.  Pt  consented to TPDN and was closely monitored througthout session     Rehab Potential  Good    PT Frequency  2x / week    PT Duration  6 weeks    PT Treatment/Interventions  ADLs/Self Care Home Management;Cryotherapy;Electrical Stimulation;Iontophoresis 4mg /ml Dexamethasone;Moist Heat;Ultrasound;Therapeutic exercise;Therapeutic activities;Patient/family education;Neuromuscular re-education;Passive range of motion;Dry needling;Taping;Manual techniques    PT Next Visit Plan  reveiwe HEP, manual and modalites TPDN assess    PT Home Exercise Plan  intiial stretches supine scapular stabilizers.    Consulted and Agree with Plan of Care  Patient       Patient will benefit from skilled therapeutic intervention in order to improve the following deficits and impairments:  Pain, Improper body mechanics, Postural dysfunction, Impaired UE functional use, Decreased range of motion, Decreased strength  Visit Diagnosis: Cervicalgia  Cramp and spasm  Muscle weakness (generalized)  Abnormal posture     Problem List Patient Active Problem List   Diagnosis Date Noted  . Essential hypertension, benign 12/28/2017  . Cervical spinal stenosis 11/23/2017  . Wound dehiscence 10/21/2017  . Cubital tunnel syndrome on right 10/05/2017  . Protrusion of cervical intervertebral disc 10/05/2017  . Radicular pain in right arm 08/16/2017  . Neck pain 08/16/2017  . Right arm pain 08/16/2017  . Chronic right shoulder pain 08/16/2017  . Acute non-recurrent frontal sinusitis 03/29/2017  . Cough 03/29/2017  . Encounter for health maintenance examination in adult 03/04/2016  . History of pneumothorax 03/04/2016  . Mixed dyslipidemia 03/04/2016   Garen LahLawrie Lilya Smitherman, PT Certified Exercise Expert for the Aging Adult  01/12/18 9:34 AM Phone: [phone] Fax: [phone]  [facility] [address] [place], [address] Phone: [phone]   Fax:  [phone]  Name: Sherrill RaringMichael B Wurm MRN: [id number] Date of Birth: 07/16/1980

## 2018-01-12 NOTE — Patient Instructions (Addendum)
Over Head Pull: Narrow Grip       On back, knees bent, feet flat, band across thighs, elbows straight but relaxed. Pull hands apart (start). Keeping elbows straight, bring arms up and over head, hands toward floor. Keep pull steady on band. Hold momentarily. Return slowly, keeping pull steady, back to start. Repeat _15 x 2__ times. Band color ___red___   Side Pull: Double Arm   On back, knees bent, feet flat. Arms perpendicular to body, shoulder level, elbows straight but relaxed. Pull arms out to sides, elbows straight. Resistance band comes across collarbones, hands toward floor. Hold momentarily. Slowly return to starting position. Repeat 15 x2___ times. Band color __red___   Elisabeth CaraSash   On back, knees bent, feet flat, left hand on left hip, right hand above left. Pull right arm DIAGONALLY (hip to shoulder) across chest. Bring right arm along head toward floor. Hold momentarily. Slowly return to starting position. Repeat _15 x 2__ times. Do with left arm. Band color __red____   Shoulder Rotation: Double Arm   On back, knees bent, feet flat, elbows tucked at sides, bent 90, hands palms up. Pull hands apart and down toward floor, keeping elbows near sides. Hold momentarily. Slowly return to starting position. Repeat 15 x 2___ times. Band color ___red___   Garen LahLawrie Mera Gunkel, PT Certified Exercise Expert for the Aging Adult  01/12/18 8:55 AM Phone: [phone] Fax: [phone]

## 2018-01-17 ENCOUNTER — Other Ambulatory Visit (INDEPENDENT_AMBULATORY_CARE_PROVIDER_SITE_OTHER): Payer: Self-pay | Admitting: Surgery

## 2018-01-17 NOTE — Telephone Encounter (Signed)
Time to stop these 2 meds.  ucall thanks

## 2018-01-17 NOTE — Telephone Encounter (Signed)
Ok to rf? 

## 2018-01-17 NOTE — Telephone Encounter (Signed)
Cristian Walker patient

## 2018-01-19 ENCOUNTER — Encounter: Payer: Self-pay | Admitting: Physical Therapy

## 2018-01-19 ENCOUNTER — Ambulatory Visit: Payer: 59 | Attending: Surgery | Admitting: Physical Therapy

## 2018-01-19 DIAGNOSIS — M542 Cervicalgia: Secondary | ICD-10-CM

## 2018-01-19 DIAGNOSIS — M6281 Muscle weakness (generalized): Secondary | ICD-10-CM | POA: Diagnosis not present

## 2018-01-19 DIAGNOSIS — R293 Abnormal posture: Secondary | ICD-10-CM

## 2018-01-19 DIAGNOSIS — R252 Cramp and spasm: Secondary | ICD-10-CM

## 2018-01-19 NOTE — Therapy (Addendum)
[facility] [address] [place], [place], [address] Phone: [phone]   Fax:  [phone]  Physical Therapy Treatment  Patient Details  Name: Cristian RaringMichael B Walker MRN: [id number] Date of Birth: 06/03/1981 Referring Provider: Annell GreeningYates, Mark MD   Encounter Date: 01/19/2018  PT End of Session - 01/19/18 0851    Visit Number  3    Number of Visits  12    Date for PT Re-Evaluation  02/16/18    Authorization Type  UHC    PT Start Time  0846    PT Stop Time  0942    PT Time Calculation (min)  56 min    Activity Tolerance  Patient tolerated treatment well    Behavior During Therapy  [facility] for tasks assessed/performed       Past Medical History:  Diagnosis Date  . Elevated blood pressure   . HNP (herniated nucleus pulposus), cervical    C4-5  . Hyperlipidemia   . Impaired fasting blood sugar   . Smoking   . Spontaneous pneumothorax    3 times, 2009, 2007, 2005    Past Surgical History:  Procedure Laterality Date  . ANTERIOR CERVICAL DECOMP/DISCECTOMY FUSION N/A 11/23/2017   Procedure: C4-5 ANTERIOR CERVICAL DECOMPRESSION/DISCECTOMY FUSION, ALLOGRAFT, PLATE;  Surgeon: Eldred MangesYates, Mark C, MD;  Location: [facility] OR;  Service: Orthopedics;  Laterality: N/A;  . HERNIA REPAIR     Right Inguinal  . LUNG SURGERY  2009, 2007, 2005   spontaneous pneumothorax x 3 surgeries    There were no vitals filed for this visit.  Subjective Assessment - 01/19/18 0852    Subjective  I was very sore after last needling.  I went out in the yard and was able to move limbs in the yard.     Pertinent History  bil pneumothorax.  several elbow pain, shoulder and neck pain. See history    Limitations  Sitting;Standing;Walking;House hold activities    How long can you sit comfortably?  unlimited headached have calmed down  having 1-2 a day not constant    How long can you stand comfortably?  unlimited    How long can you walk comfortably?  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     Currently in Pain?  Yes    Pain Score  2     Pain Location  Neck    Pain Orientation  Right    Pain Descriptors / Indicators  Pressure    Pain Type  Surgical pain;Neuropathic pain    Pain Onset  More than a month ago    Pain Frequency  Intermittent    Pain Location  Head    Pain Orientation  Right    Pain Descriptors / Indicators  Pressure reducing                       [facility] Adult PT Treatment/Exercise - 01/19/18 0858      Self-Care   Self-Care  Other Self-Care Comments    Other Self-Care Comments   tennis ball sub occipital  self release with 2 tennis balls demo and return demo      Shoulder Exercises: Supine   Other Supine Exercises  sub scapular stabilizers 15 x 2 bil shoulder flex, horizontal abd, diagponals, Bil ER green t band  Other Supine Exercises  chin tuck 10 x 10 sec hold      Moist Heat Therapy   Number Minutes Moist Heat  15 Minutes    Moist Heat Location  Cervical      Manual Therapy   Manual Therapy  Soft tissue mobilization    Manual therapy comments  skilled palpation with TPDN    Soft tissue mobilization  bil upper trapezius and levator and use of IASTYM tool also sub occipital and obliquus capitus, right scalenes , pecs and biceps    McConnell  Inhibition of right trapezius       Trigger Point Dry Needling - 01/19/18 0903    Consent Given?  Yes    Education Handout Provided  No    Muscles Treated Upper Body  Scalenes;Upper trapezius;Oblique capitus;Suboccipitals muscle group;Subscapularis  right side only for TPDN R biceps twitch and pain decreas    Scalenes Response  Twitch reponse elicited;Palpable increased muscle length right only    Upper Trapezius Response  Twitch reponse elicited;Palpable increased muscle length    Oblique Capitus Response  Twitch response elicited;Palpable  increased muscle length    SubOccipitals Response  Palpable increased muscle length    Levator Scapulae Response  Twitch response elicited    Subscapularis Response  Twitch response elicited           PT Education - 01/19/18 1112    Education Details  education on self myofasical release with two tennis balls to sub occipital area review supine ex with green t band    Person(s) Educated  Patient    Methods  Explanation;Demonstration;Tactile cues    Comprehension  Verbalized understanding;Returned demonstration       PT Short Term Goals - 01/05/18 1146      PT SHORT TERM GOAL #1   Title  STG=LTG        PT Long Term Goals - 01/19/18 0941      PT LONG TERM GOAL #1   Title  "Demonstrate and verbalize techniques to reduce the risk of re-injury including: lifting, posture, body mechanics.     Baseline  Pt is more aware of daily posture    Time  6    Period  Weeks    Status  On-going    Target Date  02/16/18      PT LONG TERM GOAL #2   Title  "Pt will be independent with advanced HEP.     Baseline  Pt given initial HEP    Time  6    Period  Weeks    Status  On-going      PT LONG TERM GOAL #3   Title  "Improved cervical rotation to allow checking blind spots while driving with minimal discomfort    Baseline  Pt reports less tense in cervcical region but most pain in right suboccipital area    Time  6    Period  Weeks    Status  On-going      PT LONG TERM GOAL #4   Title  "Pt will tolerate sitting 1 hour without increased pain without increased pain in order to perform work duties with [employer]    Time  6    Period  Weeks    Status  On-going      PT LONG TERM GOAL #5   Title  Pt will be able to raise UE above head with pain level 2/10 or less in 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    Baseline  2 headaches a day    Time  6    Period  Weeks    Status  On-going            Plan - 01/19/18 1914    Clinical Impression Statement  Pt reports 2/10 right neck and head pain and being extra sore after last TPDN.  Pt also reported new pain over lateral bicipital groove. Pt will seek MD attention if new pain does not improve.  Pt progressed sub scapular stabilizers from red to green t band in supine and given self myofasical education using 2 tennis balls for pain managment.  Pt consented to TPDN and was closely monitored thoughout session.  Pt did not acccomplish any goals today but did report 0/10 pain at end of session today.    Rehab Potential  Good    PT Frequency  2x / week    PT Duration  6 weeks    PT Treatment/Interventions  ADLs/Self Care Home Management;Cryotherapy;Electrical Stimulation;Iontophoresis 4mg /ml Dexamethasone;Moist Heat;Ultrasound;Therapeutic exercise;Therapeutic activities;Patient/family education;Neuromuscular re-education;Passive range of motion;Dry needling;Taping;Manual techniques    PT Next Visit Plan  progress sub scapular stabilizers. stand , use of weights?  manual and modalites TPDN assess and chin tuck and flex nexk for strength    PT Home Exercise Plan  intiial stretches supine scapular stabilizers.    Consulted and Agree with Plan of Care  Patient       Patient will benefit from skilled therapeutic intervention in order to improve the following deficits and impairments:  Pain, Improper body mechanics, Postural dysfunction, Impaired UE functional use, Decreased range of motion, Decreased strength  Visit Diagnosis: Cervicalgia  Cramp and  spasm  Muscle weakness (generalized)  Abnormal posture     Problem List Patient Active Problem List   Diagnosis Date Noted  . Essential hypertension, benign 12/28/2017  . Cervical spinal stenosis 11/23/2017  . Wound dehiscence 10/21/2017  . Cubital tunnel syndrome on right 10/05/2017  . Protrusion of cervical intervertebral disc 10/05/2017  . Radicular pain in right arm 08/16/2017  . Neck pain 08/16/2017  . Right arm pain 08/16/2017  . Chronic right shoulder pain 08/16/2017  . Acute non-recurrent frontal sinusitis 03/29/2017  . Cough 03/29/2017  . Encounter for health maintenance examination in adult 03/04/2016  . History of pneumothorax 03/04/2016  . Mixed dyslipidemia 03/04/2016    Garen Lah, PT Certified Exercise Expert for the Aging Adult  01/19/18 11:13 AM Phone: [phone] Fax: [phone]  [facility] [address] [place], [place], [address] Phone: [phone]   Fax:  [phone]  Name: PHAROAH GOGGINS MRN: [id number] Date of Birth: 05-03-1981

## 2018-01-24 ENCOUNTER — Ambulatory Visit: Payer: 59 | Admitting: Physical Therapy

## 2018-01-24 ENCOUNTER — Encounter: Payer: Self-pay | Admitting: Physical Therapy

## 2018-01-24 DIAGNOSIS — M6281 Muscle weakness (generalized): Secondary | ICD-10-CM

## 2018-01-24 DIAGNOSIS — M542 Cervicalgia: Secondary | ICD-10-CM

## 2018-01-24 DIAGNOSIS — R293 Abnormal posture: Secondary | ICD-10-CM

## 2018-01-24 DIAGNOSIS — R252 Cramp and spasm: Secondary | ICD-10-CM

## 2018-01-24 NOTE — Therapy (Signed)
[facility] [address] [place], [place], [address] Phone: [phone]   Fax:  [phone]  Physical Therapy Treatment  Patient Details  Name: Cristian Walker MRN: [id number] Date of Birth: [birth year] Referring Provider: Annell Greening MD   Encounter Date: 01/24/2018  PT End of Session - 01/24/18 0934    Visit Number  4    Number of Visits  12    Date for PT Re-Evaluation  02/16/18    Authorization Type  UHC    PT Start Time  0934    PT Stop Time  1030    PT Time Calculation (min)  56 min    Activity Tolerance  Patient tolerated treatment well    Behavior During Therapy  [facility] for tasks assessed/performed       Past Medical History:  Diagnosis Date  . Elevated blood pressure   . HNP (herniated nucleus pulposus), cervical    C4-5  . Hyperlipidemia   . Impaired fasting blood sugar   . Smoking   . Spontaneous pneumothorax    3 times, 2009, 2007, 2005    Past Surgical History:  Procedure Laterality Date  . ANTERIOR CERVICAL DECOMP/DISCECTOMY FUSION N/A 11/23/2017   Procedure: C4-5 ANTERIOR CERVICAL DECOMPRESSION/DISCECTOMY FUSION, ALLOGRAFT, PLATE;  Surgeon: Eldred Manges, MD;  Location: [facility] OR;  Service: Orthopedics;  Laterality: N/A;  . HERNIA REPAIR     Right Inguinal  . LUNG SURGERY  2009, 2007, 2005   spontaneous pneumothorax x 3 surgeries    There were no vitals filed for this visit.  Subjective Assessment - 01/24/18 0936    Subjective  The TPDN was just right.  I havent had any pain in last 3 days. Last headache was last Friday on August 2nd    Pertinent History  bil pneumothorax.  several elbow pain, shoulder and neck pain. See history    Limitations  Sitting;Standing;Walking;House hold activities    How long can you sit comfortably?  unlimited headached have calmed down  having 1-2 a day not constant    How long can you stand comfortably?  unlimited    How long can you walk comfortably?  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     Currently in Pain?  No/denies    Pain Score  0-No pain    Pain Location  Neck    Pain Orientation  Right    Pain Score  0    Pain Location  Head    Pain Orientation  Right    Pain Onset  More than a month ago         [facility] PT Assessment - 01/24/18 0940      AROM   Overall AROM   Deficits    Cervical Flexion  59    Cervical Extension  45    Cervical - Right Side Bend  45    Cervical - Left Side Bend  40    Cervical - Right Rotation  53    Cervical - Left Rotation  65                   [facility] Adult PT Treatment/Exercise - 01/24/18 0945      Neck Exercises: Supine   Other Supine Exercise  chin tuck and flexion 5 x 10  sec hold x 2 initiall only 3x due to fatiguing and pain onleft both      Neck Exercises: Prone   Neck Retraction  5 reps;10 secs    Other Prone Exercise  physioball y and t and w exercise 15 x bil each.    Other Prone Exercise  POE diagonals of neck and bil rotation 10 x each      Shoulder Exercises: Supine   Other Supine Exercises  sub scapular stabilizers 15 x 2 bil shoulder flex, horizontal abd, diagponals, Bil ER green t band      Moist Heat Therapy   Number Minutes Moist Heat  15 Minutes    Moist Heat Location  Cervical      Manual Therapy   Manual Therapy  Soft tissue mobilization    Manual therapy comments  skilled palpation with TPDN    Soft tissue mobilization  bil upper trapezius and levator and use of IASTYM tool also sub occipital and obliquus capitus, right scalenes , pecs and biceps       Trigger Point Dry Needling - 01/24/18 0950    Consent Given?  Yes    Education Handout Provided  No previously given    Muscles Treated Upper Body  Upper trapezius;Levator scapulae;Rhomboids right only scalene and SCM    Upper Trapezius Response  Twitch reponse elicited;Palpable increased  muscle length    Levator Scapulae Response  Twitch response elicited;Palpable increased muscle length    Rhomboids Response  Twitch response elicited;Palpable increased muscle length           PT Education - 01/24/18 1256    Education Details  reinforced HEP and movement on physioball    Person(s) Educated  Patient    Methods  Explanation;Demonstration;Verbal cues    Comprehension  Verbalized understanding;Returned demonstration       PT Short Term Goals - 01/05/18 1146      PT SHORT TERM GOAL #1   Title  STG=LTG        PT Long Term Goals - 01/19/18 0941      PT LONG TERM GOAL #1   Title  "Demonstrate and verbalize techniques to reduce the risk of re-injury including: lifting, posture, body mechanics.     Baseline  Pt is more aware of daily posture    Time  6    Period  Weeks    Status  On-going    Target Date  02/16/18      PT LONG TERM GOAL #2   Title  "Pt will be independent with advanced HEP.     Baseline  Pt given initial HEP    Time  6    Period  Weeks    Status  On-going      PT LONG TERM GOAL #3   Title  "Improved cervical rotation to allow checking blind spots while driving with minimal discomfort    Baseline  Pt reports less tense in cervcical region but most pain in right suboccipital area    Time  6    Period  Weeks    Status  On-going      PT LONG TERM GOAL #4   Title  "Pt will tolerate sitting 1 hour without increased pain without increased pain in order to perform work duties with [employer]    Time  6    Period  Weeks    Status  On-going      PT LONG TERM GOAL #5   Title  Pt  will be able to raise UE above head with pain level 2/10 or less in 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    Baseline  2 headaches a day    Time  6    Period  Weeks    Status  On-going            Plan - 01/24/18 1258    Clinical Impression Statement  Pt came to clinic without pain today.  Pt with some tightness in the right scalenes and SCM. Pt consented to TPDN to address tightness.  Pt did complain of right scalene /SCM pain at distal attachment on right side and stated he has had same pain even before surgery  Pt has increased in AROM in every plane.  See flow chart.   Pt is making good progress with HEP. Needs to continue strengthening to insure pain free motion    Rehab Potential  Good    PT Frequency  2x / week    PT Duration  6 weeks    PT Treatment/Interventions  ADLs/Self Care Home Management;Cryotherapy;Electrical Stimulation;Iontophoresis 4mg /ml Dexamethasone;Moist Heat;Ultrasound;Therapeutic exercise;Therapeutic activities;Patient/family education;Neuromuscular re-education;Passive range of motion;Dry needling;Taping;Manual techniques    PT Next Visit Plan  progress sub scapular stabilizers. needs weight to return to job.   manual and modalites, physioball prone Y , T W , reinforce chin tucks with neck flexion. on HEP    PT Home Exercise Plan  intiial stretches supine scapular stabilizers.     Consulted and Agree with Plan of Care  Patient       Patient will benefit from skilled therapeutic intervention in order to improve the following deficits and impairments:  Pain, Improper body mechanics, Postural dysfunction, Impaired UE functional use, Decreased range of motion, Decreased strength  Visit Diagnosis: Cervicalgia  Cramp and spasm  Muscle weakness (generalized)  Abnormal posture     Problem List Patient Active Problem List   Diagnosis Date  Noted  . Essential hypertension, benign 12/28/2017  . Cervical spinal stenosis 11/23/2017  . Wound dehiscence 10/21/2017  . Cubital tunnel syndrome on right 10/05/2017  . Protrusion of cervical intervertebral disc 10/05/2017  . Radicular pain in right arm 08/16/2017  . Neck pain 08/16/2017  . Right arm pain 08/16/2017  . Chronic right shoulder pain 08/16/2017  . Acute non-recurrent frontal sinusitis 03/29/2017  . Cough 03/29/2017  . Encounter for health maintenance examination in adult 03/04/2016  . History of pneumothorax 03/04/2016  . Mixed dyslipidemia 03/04/2016   Garen Lah, PT Certified Exercise Expert for the Aging Adult  01/24/18 1:07 PM Phone: [phone] Fax: [phone]  [facility] [address] [place], [place], [address] Phone: [phone]   Fax:  [phone]  Name: Cristian Eatherly  Walker MRN: [id number] Date of Birth: 1981/05/02

## 2018-01-26 ENCOUNTER — Ambulatory Visit: Payer: 59 | Admitting: Physical Therapy

## 2018-01-26 ENCOUNTER — Encounter: Payer: Self-pay | Admitting: Physical Therapy

## 2018-01-26 DIAGNOSIS — R293 Abnormal posture: Secondary | ICD-10-CM

## 2018-01-26 DIAGNOSIS — M542 Cervicalgia: Secondary | ICD-10-CM

## 2018-01-26 DIAGNOSIS — M6281 Muscle weakness (generalized): Secondary | ICD-10-CM

## 2018-01-26 DIAGNOSIS — R252 Cramp and spasm: Secondary | ICD-10-CM

## 2018-01-26 NOTE — Therapy (Addendum)
[facility] [address] [place], [place], [address] Phone: [phone]   Fax:  [phone]  Physical Therapy Treatment/Progress Note  Patient Details  Name: Cristian RaringMichael B Walker MRN: [id number] Date of Birth: 08/29/1980 Referring Provider: Annell GreeningYates, Mark MD   Encounter Date: 01/26/2018  PT End of Session - 01/26/18 1347    Visit Number  5    Number of Visits  12    Date for PT Re-Evaluation  02/16/18    Authorization Type  UHC    PT Start Time  0846    PT Stop Time  0940    PT Time Calculation (min)  54 min    Activity Tolerance  Patient tolerated treatment well    Behavior During Therapy  [facility] for [facility] assessed/performed       Past Medical History:  Diagnosis Date  . Elevated blood pressure   . HNP (herniated nucleus pulposus), cervical    C4-5  . Hyperlipidemia   . Impaired fasting blood sugar   . Smoking   . Spontaneous pneumothorax    3 times, 2009, 2007, 2005    Past Surgical History:  Procedure Laterality Date  . ANTERIOR CERVICAL DECOMP/DISCECTOMY FUSION N/A 11/23/2017   Procedure: C4-5 ANTERIOR CERVICAL DECOMPRESSION/DISCECTOMY FUSION, ALLOGRAFT, PLATE;  Surgeon: Eldred MangesYates, Mark C, MD;  Location: [facility] OR;  Service: Orthopedics;  Laterality: N/A;  . HERNIA REPAIR     Right Inguinal  . LUNG SURGERY  2009, 2007, 2005   spontaneous pneumothorax x 3 surgeries    There were no vitals filed for this visit.  Subjective Assessment - 01/26/18 0852    Subjective  I have been very irritated in the front of my shoulder throbbing and to the back of my shoulder blade since Tuesday after PT RX.  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  He reports that he doesnt have the same tremors now but he does notices some shaking when he exerts effort into right UE    Pertinent History  bil pneumothorax.  several elbow pain, shoulder and neck pain. See history    Limitations   Sitting;Standing;Walking;House hold activities    How long can you sit comfortably?  unlimited  headaches and neck are fine    How long can you stand comfortably?  unlimited    How long can you walk comfortably?  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     Currently in Pain?  Yes    Pain Score  0-No pain    Pain Location  Neck    Pain Orientation  Right    Pain Score  0    Pain Orientation  Right    Pain Location  Shoulder    Pain Orientation  Right;Anterior    Pain Descriptors / Indicators  Tender;Throbbing    Pain Type  Acute pain    Pain Onset  In the past 7 days         [facility] - 01/26/18 0001      AROM   Overall AROM   Deficits    Cervical Flexion  59    Cervical Extension  45    Cervical - Right Side Bend  45    Cervical - Left Side  Bend  40    Cervical - Right Rotation  53    Cervical - Left Rotation  65      Strength   Overall Strength  Deficits    Overall Strength Comments  supine neck flexion unable to flex 1 inch from horizontal 15 sec  normal 35 seconds without SCM compensation    Right Shoulder Flexion  4-/5    Right Shoulder Extension  4-/5    Right Shoulder ABduction  4/5    Left Shoulder Flexion  4+/5    Left Shoulder Extension  4+/5    Left Shoulder ABduction  4+/5      Palpation   Palpation comment  Pt neck pain and headache improved. Pt does compain of anterior shoulder pain over bicipital groove and superior to right clavicle                   [facility] Adult PT Treatment/Exercise - 01/26/18 0900      Neck Exercises: Supine   Other Supine Exercise  chin tuck and flexion 15 x       Neck Exercises: Prone   Other Prone Exercise  physioball y and t and w exercise 15 x bil each.    Other Prone Exercise  POE diagonals of neck and bil rotation 10 x each      Shoulder Exercises: Supine   Other  Supine Exercises  sub scapular stabilizers 15 x 2 bil shoulder flex, horizontal abd, diagponals, Bil ER green t band      Shoulder Exercises: Standing   Other Standing Exercises  serratus bil activation iwth red t band on wall x 15     Other Standing Exercises  5lb KB over head x 15 KB 10 lb overhead x 3 but too painful to continue      Modalities   Modalities  Iontophoresis;Moist Heat      Moist Heat Therapy   Number Minutes Moist Heat  15 Minutes    Moist Heat Location  Cervical;Shoulder      Iontophoresis   Type of Iontophoresis  Dexamethasone    Location  anterior shoulder    Dose  qcc    Time  4-6 hours patch to be rmoved      Manual Therapy   Manual Therapy  Soft tissue mobilization    Soft tissue mobilization  bil upper trapezius and levator and use of IASTYM tool also sub occipital and obliquus capitus, right scalenes , pecs and biceps, including anterior shoulder                PT Short Term Goals - 01/05/18 1146      PT SHORT TERM GOAL #1   Title  STG=LTG        PT Long Term Goals - 01/26/18 1336      PT LONG TERM GOAL #1   Title  "Demonstrate and verbalize techniques to reduce the risk of re-injury including: lifting, posture, body mechanics.     Baseline  Pt is more aware of daily posture and utilizing for daily activities, still with problems lift more than 5 lb overhead.  10lb causes pain in right shoulder    Time  6    Period  Weeks    Status  On-going      PT LONG TERM GOAL #2   Title  "Pt will be independent with advanced HEP.     Baseline  working on advanced    Time  6  Period  Weeks    Status  On-going      PT LONG TERM GOAL #3   Title  "Improved cervical rotation to allow checking blind spots while driving with minimal discomfort    Baseline  PT WNL and able to turn head without pain    Time  6    Period  Weeks    Status  Achieved      PT LONG TERM GOAL #4   Title  "Pt will tolerate sitting 1 hour without increased pain without  increased pain in order to perform work duties with [employer]    Baseline  has not returned to work    Time  6    Period  Weeks    Status  On-going      PT LONG TERM GOAL #5   Title  Pt will be able to raise UE above head with pain level 2/10 or less in 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    Baseline  no headaches now    Period  Weeks    Status  Achieved            Plan - 01/26/18 1339    Clinical Impression Statement  Pt with goals achieved for # 3 and # 6 LTG.  Pt able to sleep better at night and no headaches reported now.  Pt does have continued pain over anterior shoulder, superior to right clacical and scalenes.  Pt will return to Dr Ophelia Charter for evalaution of right shoulder.  Pt is unable to lift more than 10 lbs above head without  pain.  Pt will be able to achieve all goals for head and neck but may need further evaluation of right shoulder pain. with radiating pain down lateral arm.  . Pt given Iontophoresis patch over anterior shoulder to decrease inflammation.  Will continue strength for remainder of visits to prepare for return to work.  and then DC    Rehab Potential  Good    PT Frequency  2x / week    PT Duration  6 weeks    PT Treatment/Interventions  ADLs/Self Care Home Management;Cryotherapy;Electrical Stimulation;Iontophoresis 4mg /ml Dexamethasone;Moist Heat;Ultrasound;Therapeutic exercise;Therapeutic activities;Patient/family  education;Neuromuscular re-education;Passive range of motion;Dry needling;Taping;Manual techniques    PT Next Visit Plan  needs weight to return to job. 10 lb and above   manual and modalites, physioball prone Y , T W , reinforce chin tucks with neck flexion. on HEP    PT Home Exercise Plan  intiial stretches supine scapular stabilizers.     Consulted and Agree with Plan of Care  Patient       Patient will benefit from skilled therapeutic intervention in order to improve  the following deficits and impairments:  Pain, Improper body mechanics, Postural dysfunction, Impaired UE functional use, Decreased range of motion, Decreased strength  Visit Diagnosis: Cervicalgia  Cramp and spasm  Muscle weakness (generalized)  Abnormal posture     Problem List Patient Active Problem List   Diagnosis Date Noted  . Essential hypertension, benign 12/28/2017  . Cervical spinal stenosis 11/23/2017  . Wound dehiscence 10/21/2017  . Cubital tunnel syndrome on right 10/05/2017  . Protrusion of cervical intervertebral disc 10/05/2017  . Radicular pain in right arm 08/16/2017  . Neck pain 08/16/2017  . Right arm pain 08/16/2017  . Chronic right shoulder pain 08/16/2017  . Acute non-recurrent frontal sinusitis 03/29/2017  . Cough 03/29/2017  . Encounter for health maintenance examination in adult 03/04/2016  . History of pneumothorax 03/04/2016  . Mixed dyslipidemia 03/04/2016    Garen Lah, PT Certified Exercise Expert for the Aging Adult  01/26/18 1:47 PM Phone: [phone] Fax: [phone]  [facility] [address] [place], [place], [address] Phone: [phone]   Fax:  [phone]  Name: AMARE BAIL MRN: [id number] Date of Birth: 12/01/1980

## 2018-01-26 NOTE — Patient Instructions (Signed)

## 2018-01-30 DIAGNOSIS — H0011 Chalazion right upper eyelid: Secondary | ICD-10-CM | POA: Diagnosis not present

## 2018-01-31 ENCOUNTER — Ambulatory Visit (INDEPENDENT_AMBULATORY_CARE_PROVIDER_SITE_OTHER): Payer: 59 | Admitting: Orthopaedic Surgery

## 2018-01-31 ENCOUNTER — Ambulatory Visit: Payer: 59 | Admitting: Physical Therapy

## 2018-01-31 ENCOUNTER — Encounter (INDEPENDENT_AMBULATORY_CARE_PROVIDER_SITE_OTHER): Payer: Self-pay | Admitting: Orthopaedic Surgery

## 2018-01-31 VITALS — BP 142/95 | HR 93 | Ht 67.0 in | Wt 165.0 lb

## 2018-01-31 DIAGNOSIS — R293 Abnormal posture: Secondary | ICD-10-CM

## 2018-01-31 DIAGNOSIS — M542 Cervicalgia: Secondary | ICD-10-CM

## 2018-01-31 DIAGNOSIS — Z981 Arthrodesis status: Secondary | ICD-10-CM

## 2018-01-31 DIAGNOSIS — R252 Cramp and spasm: Secondary | ICD-10-CM

## 2018-01-31 DIAGNOSIS — M6281 Muscle weakness (generalized): Secondary | ICD-10-CM

## 2018-01-31 NOTE — Progress Notes (Signed)
   Post-Op Visit Note   Patient: Cristian Walker           Date of Birth: 03/20/1981           MRN: [id number] Visit Date: 01/31/2018 PCP: Jac Canavanysinger, David S, PA-C   Assessment & Plan: Post C4-5 fusion on 11/23/2017.  He is ready to resume regular work on 02/06/2018 without restrictions.  Incisions well-healed he is gotten good relief of his preop problem.  Chief Complaint:  Chief Complaint  Patient presents with  . Neck - Follow-up   Visit Diagnoses:  1. S/P cervical spinal fusion     Plan: Patient is happy with the surgical result.  He wants to resume work on Monday work slip given.  He knows he still has a little bit of weakness with the lifting weights with his right arm overhead but will work on this on his own at home with 5 or 10 pound weights.  Good relief of his significant preop pain.  Follow-Up Instructions: Return if symptoms worsen or fail to improve.   Orders:  No orders of the defined types were placed in this encounter.  No orders of the defined types were placed in this encounter.   Imaging: No results found.  PMFS History: Patient Active Problem List   Diagnosis Date Noted  . Essential hypertension, benign 12/28/2017  . Cervical spinal stenosis 11/23/2017  . Wound dehiscence 10/21/2017  . Cubital tunnel syndrome on right 10/05/2017  . Radicular pain in right arm 08/16/2017  . Neck pain 08/16/2017  . Right arm pain 08/16/2017  . Chronic right shoulder pain 08/16/2017  . Acute non-recurrent frontal sinusitis 03/29/2017  . Cough 03/29/2017  . Encounter for health maintenance examination in adult 03/04/2016  . History of pneumothorax 03/04/2016  . Mixed dyslipidemia 03/04/2016   Past Medical History:  Diagnosis Date  . Elevated blood pressure   . HNP (herniated nucleus pulposus), cervical    C4-5  . Hyperlipidemia   . Impaired fasting blood sugar   . Smoking   . Spontaneous pneumothorax    3 times, 2009, 2007, 2005    Family History  Problem  Relation Age of Onset  . Hypertension Mother   . Heart disease Father 50       stent  . Alcohol abuse Father   . Cancer Maternal Grandfather        lung, asbestos    Past Surgical History:  Procedure Laterality Date  . ANTERIOR CERVICAL DECOMP/DISCECTOMY FUSION N/A 11/23/2017   Procedure: C4-5 ANTERIOR CERVICAL DECOMPRESSION/DISCECTOMY FUSION, ALLOGRAFT, PLATE;  Surgeon: Eldred MangesYates, Decklin Weddington C, MD;  Location: [facility] OR;  Service: Orthopedics;  Laterality: N/A;  . HERNIA REPAIR     Right Inguinal  . LUNG SURGERY  2009, 2007, 2005   spontaneous pneumothorax x 3 surgeries   Social History   Occupational History  . Not on file  Tobacco Use  . Smoking status: Former Smoker    Years: 0.50  . Smokeless tobacco: Never Used  Substance and Sexual Activity  . Alcohol use: No  . Drug use: No  . Sexual activity: Yes    Partners: Female

## 2018-01-31 NOTE — Therapy (Signed)
[place], [place], [address] Phone: [phone]   Fax:  [phone]  Physical Therapy Treatment  Patient Details  Name: Cristian Walker MRN: [id number] Date of Birth: [birth year] Referring Provider: Rodell Perna MD   Encounter Date: 01/31/2018  PT End of Session - 01/31/18 1107    Visit Number  6    Number of Visits  12    Date for PT Re-Evaluation  02/16/18    Authorization Type  UHC    PT Start Time  1105    PT Stop Time  1145    PT Time Calculation (min)  40 min    Activity Tolerance  Patient tolerated treatment well    Behavior During Therapy  [facility] for tasks assessed/performed       Past Medical History:  Diagnosis Date  . Elevated blood pressure   . HNP (herniated nucleus pulposus), cervical    C4-5  . Hyperlipidemia   . Impaired fasting blood sugar   . Smoking   . Spontaneous pneumothorax    3 times, 2009, 2007, 2005    Past Surgical History:  Procedure Laterality Date  . ANTERIOR CERVICAL DECOMP/DISCECTOMY FUSION N/A 11/23/2017   Procedure: C4-5 ANTERIOR CERVICAL DECOMPRESSION/DISCECTOMY FUSION, ALLOGRAFT, PLATE;  Surgeon: Marybelle Killings, MD;  Location: [facility];  Service: Orthopedics;  Laterality: N/A;  . HERNIA REPAIR     Right Inguinal  . LUNG SURGERY  2009, 2007, 2005   spontaneous pneumothorax x 3 surgeries    There were no vitals filed for this visit.  Subjective Assessment - 01/31/18 1111    Subjective  Dr Lorin Mercy released pt to go to work and he will return next Monday.  No impingement noted on x ray.  Pt is 1/10 today for pain and requested today be his last treatment    Pertinent History  bil pneumothorax.  several elbow pain, shoulder and neck pain. See history    How long can you sit comfortably?  unlimited  headaches and neck are fine    How long can you stand comfortably?  unlimited    How long can you walk comfortably?  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     Currently in Pain?  No/denies    Pain Score  1     Pain Location  Neck    Pain Orientation  Right    Pain Descriptors / Indicators  Discomfort    Pain Onset  More than a month ago    Pain Frequency  Intermittent    Pain Score  0    Pain Orientation  Right    Pain Descriptors / Indicators  Discomfort    Pain Type  Neuropathic pain    Pain Score  1    Pain Location  Shoulder    Pain Orientation  Right;Anterior         [facility] PT Assessment - 01/31/18 1132      AROM   Overall AROM   Deficits    Right Shoulder Flexion  150 Degrees    Left Shoulder Flexion  155 Degrees    Cervical Flexion  60    Cervical Extension  45    Cervical - Right Side Bend  45    Cervical -  Left Side Bend  40    Cervical - Right Rotation  56    Cervical - Left Rotation  65      Strength   Overall Strength  Deficits    Overall Strength Comments  supine neck flexion unable to flex 1 inch from horizontal 40 sec  normal 35 seconds without SCM compensation    Right Shoulder Flexion  4/5    Right Shoulder Extension  4+/5    Right Shoulder ABduction  4/5    Left Shoulder Flexion  5/5    Left Shoulder Extension  5/5    Left Shoulder ABduction  5/5                   [facility] Adult PT Treatment/Exercise - 01/31/18 1105      Self-Care   Self-Care  Other Self-Care Comments    Other Self-Care Comments   discussed communtity wellness and return to gym/exercise regularly to keep healthy and progress strengthening      Neck Exercises: Supine   Other Supine Exercise  chin tuck and flexion 15 x       Neck Exercises: Prone   Other Prone Exercise  prone T and Extension bil x 15     avoinding over head Y at this time due to sensitivity    Other Prone Exercise  POE diagonals of neck and bil rotation 10 x each      Shoulder Exercises: Sidelying   External  Rotation  15 reps;Strengthening    External Rotation Weight (lbs)  5    External Rotation Limitations  90 flex and 90 elbow flex ER x 10 with 3 #      Shoulder Exercises: Standing   Other Standing Exercises  serratus bil activation iwth red t band on wall x 15     Other Standing Exercises  ball on wall and circles 20 CW and CCW               PT Short Term Goals - 01/05/18 1146      PT SHORT TERM GOAL #1   Title  STG=LTG        PT Long Term Goals - 01/31/18 1108      PT LONG TERM GOAL #1   Title  "Demonstrate and verbalize techniques to reduce the risk of re-injury including: lifting, posture, body mechanics.     Baseline  Pt able to verbalize at least 3 strategies, for using at work and using all AROM    Time  6    Period  Weeks    Status  Achieved      PT LONG TERM GOAL #2   Title  "Pt will be independent with advanced HEP.     Baseline  Pt independent with all given    Time  6    Period  Weeks    Status  Achieved      PT LONG TERM GOAL #3   Title  "Improved cervical rotation to allow checking blind spots while driving with minimal discomfort    Baseline  PT WNL and able to turn head without pain    Time  6    Period  Weeks    Status  Achieved      PT LONG TERM GOAL #4   Title  "Pt will tolerate sitting 1 hour without increased pain without increased pain in order to perform work duties with [employer]  released to go back  to work and able to sit through a movie     Time  6    Period  Weeks    Status  Achieved      PT LONG TERM GOAL #5   Title  Pt will be able to raise UE above head with pain level 2/10 or less in order to return to work with installation at [employer]  Pt able to raise above head without exacerbating neck and head pain but has right anterior shoulder pain 0/10 with no weight and  5 lb    Time  6    Period  Weeks    Status  Achieved      PT LONG TERM GOAL #6   Title  Pt will be able to sleep at night for 4  or more uninterrupted hours of restorative sleep    Baseline  8 hours at night    Time  6    Period  Weeks    Status  Achieved      PT LONG TERM GOAL #7   Title  pt will be able to reduce headaches to 1 or less per day.    Baseline  no headaches now    Time  6    Period  Weeks    Status  Achieved            Plan - 01/31/18 1405    Clinical Impression Statement  Pt requests to DC due to knowing his exercises and being released from the MD.  MD notes no impingement on right shoulder and pt only has 1/10 pain in shoulder but no headaches or minimal 1/10 neck pain.  He can mostly work out pain in neck and head with exercise.  Pt knows how to progress HEP for shoulder in order to strengthen of work . MD cleared pt of any impingement on right shoulder.  Pt has achieved all LTG and will continue at home with HEP.  Pt WNL for AROM of cervical and no more headaches reported in weeks.     Rehab Potential  Good    PT Frequency  2x / week    PT Duration  6 weeks    PT Treatment/Interventions  ADLs/Self Care Home Management;Cryotherapy;Electrical Stimulation;Iontophoresis 30m/ml Dexamethasone;Moist Heat;Ultrasound;Therapeutic exercise;Therapeutic activities;Patient/family education;Neuromuscular re-education;Passive range of motion;Dry needling;Taping;Manual techniques    PT Next Visit Plan  DC    PT Home Exercise Plan  Advanced HEP given     Consulted and Agree with Plan of Care  Patient       Patient will benefit from skilled therapeutic intervention in order to improve the following deficits and impairments:  Pain, Improper body mechanics, Postural dysfunction, Impaired UE functional use, Decreased range of motion, Decreased strength  Visit Diagnosis: Cervicalgia  Cramp and spasm  Muscle weakness (generalized)  Abnormal posture     Problem List Patient Active Problem List   Diagnosis Date Noted  . Essential hypertension, benign 12/28/2017  . Cervical spinal stenosis 11/23/2017   . Wound dehiscence 10/21/2017  . Cubital tunnel syndrome on right 10/05/2017  . Radicular pain in right arm 08/16/2017  . Neck pain 08/16/2017  . Right arm pain 08/16/2017  . Chronic right shoulder pain 08/16/2017  . Acute non-recurrent frontal sinusitis 03/29/2017  . Cough 03/29/2017  . Encounter for health maintenance examination in adult 03/04/2016  . History of pneumothorax 03/04/2016  . Mixed dyslipidemia 03/04/2016   LVoncille Lo PT Certified Exercise Expert for the Aging  Adult  01/31/18 2:11 PM Phone: [phone] Fax: [facility] [address] [place], [place], [address] Phone: [phone]   Fax:  [phone]  Name: Cristian Walker MRN: [id number] Date of Birth: 01-04-1981   PHYSICAL THERAPY DISCHARGE SUMMARY  Visits from Start of Care:6  Current functional level related to goals / functional outcomes: Pt Left UE 4/5 to 4-/5  Right is 5/5  Remaining deficits: Only left UE weakness improving with HEP   Education / Equipment: HEP Plan: Patient agrees to discharge.  Patient goals were met. Patient is being discharged due to meeting the stated rehab goals.  ?????    And being pleased with current functional level  Voncille Lo, PT Certified Exercise Expert for the Aging Adult  01/31/18 2:12 PM Phone: [phone] Fax: [phone]

## 2018-01-31 NOTE — Patient Instructions (Addendum)
      Garen LahLawrie Shaira Sova, PT Certified Exercise Expert for the Aging Adult  01/31/18 11:29 AM Phone: [phone] Fax: [phone] [email or web address]

## 2018-02-02 ENCOUNTER — Ambulatory Visit: Payer: 59 | Admitting: Physical Therapy

## 2018-02-07 ENCOUNTER — Ambulatory Visit: Payer: 59 | Admitting: Physical Therapy

## 2018-02-09 ENCOUNTER — Encounter: Payer: 59 | Admitting: Physical Therapy

## 2018-02-14 ENCOUNTER — Encounter: Payer: 59 | Admitting: Physical Therapy

## 2018-02-16 ENCOUNTER — Encounter: Payer: 59 | Admitting: Physical Therapy

## 2018-03-12 ENCOUNTER — Encounter (HOSPITAL_COMMUNITY): Payer: Self-pay

## 2018-03-12 ENCOUNTER — Ambulatory Visit (HOSPITAL_COMMUNITY)
Admission: EM | Admit: 2018-03-12 | Discharge: 2018-03-12 | Disposition: A | Discharge status: Home / Self Care | Payer: Worker's Compensation | Attending: Family Medicine | Admitting: Family Medicine

## 2018-03-12 DIAGNOSIS — M25511 Pain in right shoulder: Secondary | ICD-10-CM

## 2018-03-12 MED ORDER — PREDNISONE 10 MG (21) PO TBPK: ORAL_TABLET | Freq: Every day | ORAL | 0 refills | Status: DC

## 2018-03-12 NOTE — ED Provider Notes (Signed)
[facility]    CSN: [id number] Arrival date & time: 03/12/18  1522     History   Chief Complaint Chief Complaint  Patient presents with  . Shoulder Injury    HPI Cristian Walker is a 37 y.o. male.   Cristian Walker presents with complaints of right shoulder pain which started a week ago while at work. Was using a large wrench and felt shoulder pop. Has had pain since. Worse at night. Worse with movements. Sometimes has numbness to 4th and 5th fingers. No current numbness or tingling. Has had ulnar nerve surgery in the past. Had cervical spinal fusion 11/23/2017 with dr. Ophelia Walker. Has been working with his occupational nurse. Has been using tylenol, ibuprofen, lidocaine patches and KT tape with minimal relief. Per chart review has a history of chronic shoulder pain. Patient states this has been different.    ROS per HPI.      Past Medical History:  Diagnosis Date  . Elevated blood pressure   . HNP (herniated nucleus pulposus), cervical    C4-5  . Hyperlipidemia   . Impaired fasting blood sugar   . Smoking   . Spontaneous pneumothorax    3 times, 2009, 2007, 2005    Patient Active Problem List   Diagnosis Date Noted  . Essential hypertension, benign 12/28/2017  . Cervical spinal stenosis 11/23/2017  . Wound dehiscence 10/21/2017  . Cubital tunnel syndrome on right 10/05/2017  . Radicular pain in right arm 08/16/2017  . Neck pain 08/16/2017  . Right arm pain 08/16/2017  . Chronic right shoulder pain 08/16/2017  . Acute non-recurrent frontal sinusitis 03/29/2017  . Cough 03/29/2017  . Encounter for health maintenance examination in adult 03/04/2016  . History of pneumothorax 03/04/2016  . Mixed dyslipidemia 03/04/2016    Past Surgical History:  Procedure Laterality Date  . ANTERIOR CERVICAL DECOMP/DISCECTOMY FUSION N/A 11/23/2017   Procedure: C4-5 ANTERIOR CERVICAL DECOMPRESSION/DISCECTOMY FUSION, ALLOGRAFT, PLATE;  Surgeon: Cristian Walker, Cristian C, MD;  Location: [facility] OR;   Service: Orthopedics;  Laterality: N/A;  . HERNIA REPAIR     Right Inguinal  . LUNG SURGERY  2009, 2007, 2005   spontaneous pneumothorax x 3 surgeries       Home Medications    Prior to Admission medications   Medication Sig Start Date End Date Taking? Authorizing Provider  ALPRAZolam Prudy Feeler(XANAX) 0.5 MG tablet 1 tablet every 8 - 12 hours 01/12/18   Tysinger, Kermit Baloavid S, PA-Walker  pravastatin (PRAVACHOL) 20 MG tablet Take 1 tablet (20 mg total) by mouth every evening. 12/28/17 12/28/18  Tysinger, Kermit Baloavid S, PA-Walker  predniSONE (STERAPRED UNI-PAK 21 TAB) 10 MG (21) TBPK tablet Take by mouth daily. Per box instruct 03/12/18   Linus MakoBurky, Cristian B, NP  quinapril (ACCUPRIL) 10 MG tablet Take 1 tablet (10 mg total) by mouth daily. 12/28/17 12/28/18  Tysinger, Kermit Baloavid S, PA-Walker    Family History Family History  Problem Relation Age of Onset  . Hypertension Mother   . Heart disease Father 50       stent  . Alcohol abuse Father   . Cancer Maternal Grandfather        lung, asbestos    Social History Social History   Tobacco Use  . Smoking status: Former Smoker    Years: 0.50  . Smokeless tobacco: Never Used  Substance Use Topics  . Alcohol use: No  . Drug use: No     Allergies   Patient has no known allergies.   Review of Systems  Review of Systems   Physical Exam Triage Vital Signs ED Triage Vitals  Enc Vitals Group     BP 03/12/18 1633 (!) 157/106     Pulse Rate 03/12/18 1633 73     Resp 03/12/18 1633 20     Temp 03/12/18 1633 98 F (36.7 Walker)     Temp Source 03/12/18 1633 Oral     SpO2 03/12/18 1633 100 %     Weight --      Height --      Head Circumference --      Peak Flow --      Pain Score 03/12/18 1634 10     Pain Loc --      Pain Edu? --      Excl. in GC? --    No data found.  Updated Vital Signs BP (!) 157/106 (BP Location: Right Arm)   Pulse 73   Temp 98 F (36.7 Walker) (Oral)   Resp 20   SpO2 100%   Visual Acuity Right Eye Distance:   Left Eye Distance:   Bilateral  Distance:    Right Eye Near:   Left Eye Near:    Bilateral Near:     Physical Exam  Constitutional: He is oriented to person, place, and time. He appears well-developed and well-nourished.  Cardiovascular: Normal rate and regular rhythm.  Pulmonary/Chest: Effort normal and breath sounds normal.  Musculoskeletal:       Right shoulder: He exhibits decreased range of motion, tenderness and pain. He exhibits no bony tenderness, no swelling, no effusion, no crepitus, no deformity, no laceration, no spasm, normal pulse and normal strength.       Right elbow: Normal.      Right wrist: Normal.       Arms:      Right hand: Normal.  Right anterior medial shoulder with tenderness and pain;pain with overhead arc but patient able to perform; pain with external rotation and adduction; no pain with ac compression; strength equal bilaterally; gross sensation intact to upper extremities; strong radial pulse   Neurological: He is alert and oriented to person, place, and time.  Skin: Skin is warm and dry.     UC Treatments / Results  Labs (all labs ordered are listed, but only abnormal results are displayed) Labs Reviewed - No data to display  EKG None  Radiology No results found.  Procedures Procedures (including critical care time)  Medications Ordered in UC Medications - No data to display  Initial Impression / Assessment and Plan / UC Course  I have reviewed the triage vital signs and the nursing notes.  Pertinent labs & imaging results that were available during my care of the patient were reviewed by me and considered in my medical decision making (see chart for details).     Acute on chronic right shoulder pain. No red flag deficits today. Appropriate to follow up with orthopedics Dr Cristian Charter who he has been seeing for this issue. This did occur at work however, so to continue to work with occupational health to then get referral to ortho. Prednisone pack provided. Patient verbalized  understanding and agreeable to plan.    Final Clinical Impressions(s) / UC Diagnoses   Final diagnoses:  Right shoulder pain, unspecified chronicity     Discharge Instructions     Tylenol as needed, ice at the end of the day.  Prednisone pack as provided.  No ibuprofen until completed prednisone. See exercises provided.  Please follow up with  your work Engineer, civil (consulting) and/or occupational health to get ortho referral for management of this.    ED Prescriptions    Medication Sig Dispense Auth. Provider   predniSONE (STERAPRED UNI-PAK 21 TAB) 10 MG (21) TBPK tablet Take by mouth daily. Per box instruct 21 tablet Georgetta Haber, NP     Controlled Substance Prescriptions [place] Controlled Substance Registry consulted? Not Applicable   Georgetta Haber, NP 03/12/18 1710

## 2018-03-12 NOTE — Discharge Instructions (Signed)
Tylenol as needed, ice at the end of the day.  Prednisone pack as provided.  No ibuprofen until completed prednisone. See exercises provided.  Please follow up with your work nurse and/or occupational health to get ortho referral for management of this.

## 2018-03-12 NOTE — ED Triage Notes (Signed)
Pt presents with right shoulder pain after an injury at work a week ago.

## 2018-04-03 DIAGNOSIS — M25511 Pain in right shoulder: Secondary | ICD-10-CM | POA: Diagnosis not present

## 2018-04-19 DIAGNOSIS — M25511 Pain in right shoulder: Secondary | ICD-10-CM | POA: Diagnosis not present

## 2018-07-13 ENCOUNTER — Other Ambulatory Visit: Payer: Self-pay | Admitting: Medical

## 2018-07-13 NOTE — Telephone Encounter (Signed)
Is this ok to refill?  

## 2018-07-22 ENCOUNTER — Other Ambulatory Visit: Payer: Self-pay | Admitting: Medical

## 2018-09-25 ENCOUNTER — Other Ambulatory Visit: Payer: Self-pay | Admitting: Medical

## 2018-09-25 MED ORDER — PRAVASTATIN SODIUM 20 MG PO TABS: ORAL_TABLET | ORAL | 0 refills | Status: DC

## 2018-11-15 ENCOUNTER — Encounter: Payer: Self-pay | Admitting: Medical

## 2018-11-15 ENCOUNTER — Ambulatory Visit (INDEPENDENT_AMBULATORY_CARE_PROVIDER_SITE_OTHER): Payer: 59 | Admitting: Medical

## 2018-11-15 ENCOUNTER — Other Ambulatory Visit: Payer: Self-pay

## 2018-11-15 VITALS — BP 130/80 | HR 80 | Temp 98.4°F | Resp 16 | Ht 67.0 in | Wt 176.4 lb

## 2018-11-15 DIAGNOSIS — Z8249 Family history of ischemic heart disease and other diseases of the circulatory system: Secondary | ICD-10-CM | POA: Insufficient documentation

## 2018-11-15 DIAGNOSIS — E782 Mixed hyperlipidemia: Secondary | ICD-10-CM

## 2018-11-15 DIAGNOSIS — M79652 Pain in left thigh: Secondary | ICD-10-CM | POA: Insufficient documentation

## 2018-11-15 DIAGNOSIS — Z0000 Encounter for general adult medical examination without abnormal findings: Secondary | ICD-10-CM

## 2018-11-15 DIAGNOSIS — Z8739 Personal history of other diseases of the musculoskeletal system and connective tissue: Secondary | ICD-10-CM | POA: Insufficient documentation

## 2018-11-15 DIAGNOSIS — Z8709 Personal history of other diseases of the respiratory system: Secondary | ICD-10-CM

## 2018-11-15 DIAGNOSIS — I10 Essential (primary) hypertension: Secondary | ICD-10-CM

## 2018-11-15 LAB — POCT URINALYSIS DIP (PROADVANTAGE DEVICE)
Bilirubin, UA: NEGATIVE
Blood, UA: NEGATIVE
Glucose, UA: NEGATIVE mg/dL
Ketones, POC UA: NEGATIVE mg/dL
Leukocytes, UA: NEGATIVE
Nitrite, UA: NEGATIVE
Protein Ur, POC: NEGATIVE mg/dL
Specific Gravity, Urine: 1.01
Urobilinogen, Ur: NEGATIVE
pH, UA: 6 (ref 5.0–8.0)

## 2018-11-15 NOTE — Progress Notes (Signed)
Subjective:   HPI  Cristian Walker is a 38 y.o. male who presents for Chief Complaint  Patient presents with  . CPE    CPE     Patient Care Team: Benjiman Sedgwick, Cleda Mccreedy as PCP - General (Family Medicine) Sees dentist Sees eye doctor Dr. Annell Greening, orthopedics  Concerns: Expecting first child, girl august 30  Having weird pain in upper inner thigh, intermittent, sometimes all day.  No trauma, no bruising, no swelling, no discoloration, no paresthesias.   Sometimes pain awakes him at night.   Worried about clogged artery.   Compliant with BP and cholesterol medication  No other aggravating or relieving factors. No other complaint.  Reviewed their medical, surgical, family, social, medication, and allergy history and updated chart as appropriate.  Past Medical History:  Diagnosis Date  . HNP (herniated nucleus pulposus), cervical    C4-5  . Hyperlipidemia   . Hypertension   . Impaired fasting blood sugar   . Spontaneous pneumothorax    3 times, 2009, 2007, 2005    Past Surgical History:  Procedure Laterality Date  . ANTERIOR CERVICAL DECOMP/DISCECTOMY FUSION N/A 11/23/2017   Procedure: C4-5 ANTERIOR CERVICAL DECOMPRESSION/DISCECTOMY FUSION, ALLOGRAFT, PLATE;  Surgeon: Eldred Manges, MD;  Location: [facility];  Service: Orthopedics;  Laterality: N/A;  . HERNIA REPAIR     Right Inguinal  . LUNG SURGERY  2009, 2007, 2005   spontaneous pneumothorax x 3 surgeries    Social History   Socioeconomic History  . Marital status: Married    Spouse name: Not on file  . Number of children: Not on file  . Years of education: Not on file  . Highest education level: Not on file  Occupational History  . Not on file  Social Needs  . Financial resource strain: Not on file  . Food insecurity:    Worry: Not on file    Inability: Not on file  . Transportation needs:    Medical: Not on file    Non-medical: Not on file  Tobacco Use  . Smoking status: Former Smoker    Years: 0.50   . Smokeless tobacco: Never Used  Substance and Sexual Activity  . Alcohol use: No  . Drug use: No  . Sexual activity: Yes    Partners: Female  Lifestyle  . Physical activity:    Days per week: Not on file    Minutes per session: Not on file  . Stress: Not on file  Relationships  . Social connections:    Talks on phone: Not on file    Gets together: Not on file    Attends religious service: Not on file    Active member of club or organization: Not on file    Attends meetings of clubs or organizations: Not on file    Relationship status: Not on file  . Intimate partner violence:    Fear of current or ex partner: Not on file    Emotionally abused: Not on file    Physically abused: Not on file    Forced sexual activity: Not on file  Other Topics Concern  . 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  Exercise - walking some, better diet in 2017. 10/2018    Family History  Problem Relation Age of Onset  . Hypertension Mother   . Heart disease Father 50       stent  . Alcohol abuse Father   .  Cancer Maternal Grandfather        lung, asbestos     Current Outpatient Medications:  .  pravastatin (PRAVACHOL) 20 MG tablet, TAKE 1 TABLET BY MOUTH EVERY DAY IN THE EVENING, Disp: 90 tablet, Rfl: 0 .  quinapril (ACCUPRIL) 10 MG tablet, TAKE 1 TABLET BY MOUTH EVERY DAY, Disp: 90 tablet, Rfl: 1  No Known Allergies   Review of Systems Constitutional: -fever, -chills, -sweats, -unexpected weight change, -decreased appetite, -fatigue Allergy: -sneezing, -itching, -congestion Dermatology: -changing moles, --rash, -lumps ENT: -runny nose, -ear pain, -sore throat, -hoarseness, -sinus pain, -teeth pain, - ringing in ears, -hearing loss, -nosebleeds Cardiology: -chest pain, -palpitations, -swelling, -difficulty breathing when lying flat, -waking up short of breath Respiratory: -cough, -shortness of breath, -difficulty  breathing with exercise or exertion, -wheezing, -coughing up blood Gastroenterology: -abdominal pain, -nausea, -vomiting, -diarrhea, -constipation, -blood in stool, -changes in bowel movement, -difficulty swallowing or eating Hematology: -bleeding, -bruising  Musculoskeletal: -joint aches, -muscle aches, -joint swelling, -back pain, -neck pain, -cramping, -changes in gait Ophthalmology: denies vision changes, eye redness, itching, discharge Urology: -burning with urination, -difficulty urinating, -blood in urine, -urinary frequency, -urgency, -incontinence Neurology: -headache, -weakness, -tingling, -numbness, -memory loss, -falls, -dizziness Psychology: -depressed mood, -agitation, -sleep problems Male GU: no testicular mass, pain, no lymph nodes swollen, no swelling, no rash.     Objective:  BP 130/80   Pulse 80   Temp 98.4 F (36.9 C) (Oral)   Resp 16   Ht  (1.702 m)   Wt 176 lb 6.4 oz (80 kg)   SpO2 98%   BMI 27.63 kg/m   General appearance: alert, no distress, WD/WN, Caucasian male Skin: unremarkable HE ENT: normocephalic, conjunctiva/corneas normal, sclerae anicteric, PERRLA, EOMi, nares patent, no discharge or erythema, pharynx normal Oral cavity: MMM, tongue normal, teeth normal Neck: supple, no lymphadenopathy, no thyromegaly, no masses, normal ROM, no bruits Chest: non tender, normal shape and expansion Heart: RRR, normal S1, S2, no murmurs Lungs: CTA bilaterally, no wheezes, rhonchi, or rales Abdomen: +bs, soft, non tender, non distended, no masses, no hepatomegaly, no splenomegaly, no bruits Back: non tender, normal ROM, no scoliosis Musculoskeletal: left thigh with mild point tenderness in medial upper thigh lower 1/3rd of thigh, otherwise upper extremities non tender, no obvious deformity, normal ROM throughout, lower extremities non tender, no obvious deformity, normal ROM throughout Extremities: no edema, no cyanosis, no clubbing Pulses: 2+ symmetric, upper and  lower extremities, normal cap refill Neurological: alert, oriented x 3, CN2-12 intact, strength normal upper extremities and lower extremities, sensation normal throughout, DTRs 2+ throughout, no cerebellar signs, gait normal Psychiatric: normal affect, behavior normal, pleasant  GU: normal male external genitalia,circumcised, nontender, no masses, no hernia, no lymphadenopathy Rectal: deferred   Assessment and Plan :   Encounter Diagnoses  Name Primary?  . Routine general medical examination at a health care facility Yes  . Essential hypertension, benign   . Mixed dyslipidemia   . Family history of premature CAD   . History of pneumothorax   . History of spinal stenosis   . Left thigh pain    Congratulated him on upcoming first baby.  Physical exam - discussed and counseled on healthy lifestyle, diet, exercise, preventative care, vaccinations, sick and well care, proper use of emergency dept and after hours care, and addressed their concerns.    Health screening: See your eye doctor yearly for routine vision care. See your dentist yearly for routine dental care including hygiene visits twice yearly.  Cancer screening  Advised monthly self testicular exam  Vaccinations: Advised yearly influenza vaccine   Acute issues discussed: Left thigh pain -cause of his pain is not clear.  I suspect it could just be a varicose vein or phlebitis that is mild.  Advised over-the-counter aspirin for 5 days and/or heat pad 20 minutes on 20 minutes off.  If this is not helpful or if the symptoms persist or get worse then we could consider ultrasound or CT scan.  Separate significant chronic issues discussed: C/t current medications for BP, cholesterol.    Cristian NeedleMichael was seen today for cpe.  Diagnoses and all orders for this visit:  Routine general medical examination at a health care facility -     POCT Urinalysis DIP (Proadvantage Device) -     Comprehensive metabolic panel -     Hemoglobin  A1c -     CBC -     Lipid panel  Essential hypertension, benign  Mixed dyslipidemia -     Lipid panel  Family history of premature CAD  History of pneumothorax  History of spinal stenosis  Left thigh pain   Follow-up pending labs, yearly for physical

## 2018-11-16 LAB — CBC
Hematocrit: 46.5 % (ref 37.5–51.0)
Hemoglobin: 16.1 g/dL (ref 13.0–17.7)
MCH: 29.5 pg (ref 26.6–33.0)
MCHC: 34.6 g/dL (ref 31.5–35.7)
MCV: 85 fL (ref 79–97)
Platelets: 318 10*3/uL (ref 150–450)
RBC: 5.45 x10E6/uL (ref 4.14–5.80)
RDW: 12.6 % (ref 11.6–15.4)
WBC: 5.9 10*3/uL (ref 3.4–10.8)

## 2018-11-16 LAB — COMPREHENSIVE METABOLIC PANEL
ALT: 30 IU/L (ref 0–44)
AST: 18 IU/L (ref 0–40)
Albumin/Globulin Ratio: 2.8 — ABNORMAL HIGH (ref 1.2–2.2)
Albumin: 5.1 g/dL — ABNORMAL HIGH (ref 4.0–5.0)
Alkaline Phosphatase: 83 IU/L (ref 39–117)
BUN/Creatinine Ratio: 15 (ref 9–20)
BUN: 14 mg/dL (ref 6–24)
Bilirubin Total: 0.4 mg/dL (ref 0.0–1.2)
CO2: 22 mmol/L (ref 20–29)
Calcium: 9.6 mg/dL (ref 8.7–10.2)
Chloride: 100 mmol/L (ref 96–106)
Creatinine, Ser: 0.91 mg/dL (ref 0.76–1.27)
GFR calc Af Amer: 121 mL/min/{1.73_m2} (ref >59)
GFR calc non Af Amer: 105 mL/min/{1.73_m2} (ref >59)
Globulin, Total: 1.8 g/dL (ref 1.5–4.5)
Glucose: 100 mg/dL — ABNORMAL HIGH (ref 65–99)
Potassium: 3.8 mmol/L (ref 3.5–5.2)
Sodium: 141 mmol/L (ref 134–144)
Total Protein: 6.9 g/dL (ref 6.0–8.5)

## 2018-11-16 LAB — HEMOGLOBIN A1C
Est. average glucose Bld gHb Est-mCnc: 111 mg/dL
Hgb A1c MFr Bld: 5.5 % (ref 4.8–5.6)

## 2018-11-16 LAB — LIPID PANEL
Chol/HDL Ratio: 4.5 ratio (ref 0.0–5.0)
Cholesterol, Total: 202 mg/dL — ABNORMAL HIGH (ref 100–199)
HDL: 45 mg/dL (ref >39)
LDL Calculated: 112 mg/dL — ABNORMAL HIGH (ref 0–99)
Triglycerides: 223 mg/dL — ABNORMAL HIGH (ref 0–149)
VLDL Cholesterol Cal: 45 mg/dL — ABNORMAL HIGH (ref 5–40)

## 2018-11-30 ENCOUNTER — Other Ambulatory Visit: Payer: Self-pay | Admitting: Medical

## 2018-11-30 MED ORDER — ROSUVASTATIN CALCIUM 20 MG PO TABS: 20.0000 mg | ORAL_TABLET | Freq: Every day | ORAL | 3 refills | Status: DC

## 2019-02-18 ENCOUNTER — Other Ambulatory Visit: Payer: Self-pay | Admitting: Medical

## 2019-08-14 ENCOUNTER — Other Ambulatory Visit: Payer: Self-pay | Admitting: Medical

## 2019-11-01 IMAGING — MR MR CERVICAL SPINE W/O CM
4 of 5 series · 30 of 48 positions shown · non-contrast
Comparison: Prior radiographs from 08/25/2017.

CLINICAL DATA: Initial evaluation for right clavicular pain
radiating into the right upper extremity for 4 months.

EXAM:
MRI CERVICAL SPINE WITHOUT CONTRAST
TECHNIQUE: Multiplanar, multisequence MR imaging of the cervical spine was
performed. No intravenous contrast was administered.

[Series 2: (id) tse sag · sagittal · 3.0mm · 0.41mm/px · 6 of 13 slices shown]
[im 1/13]
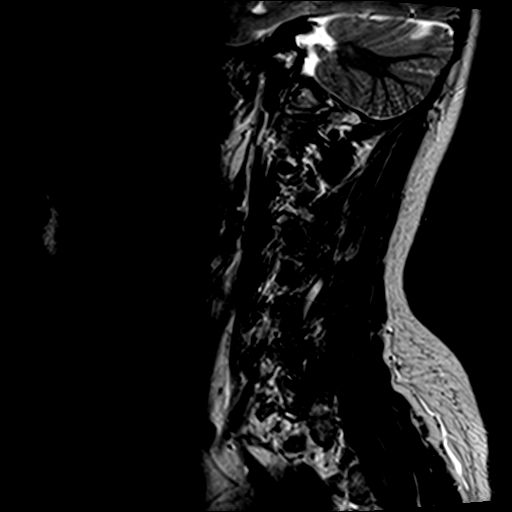
[im 3/13]
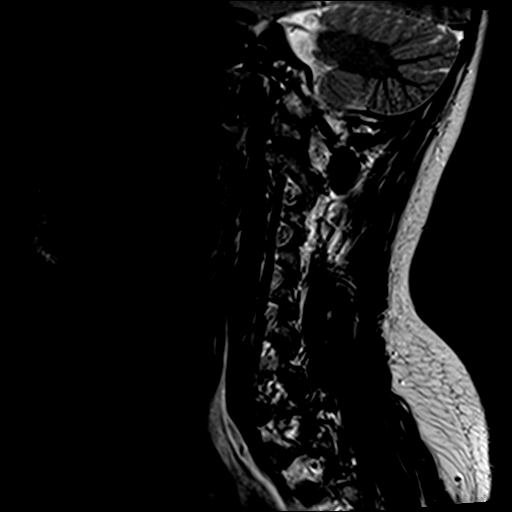
[im 5/13]
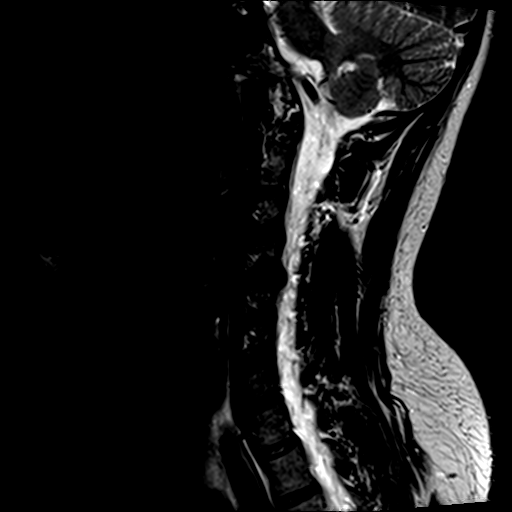
[im 8/13]
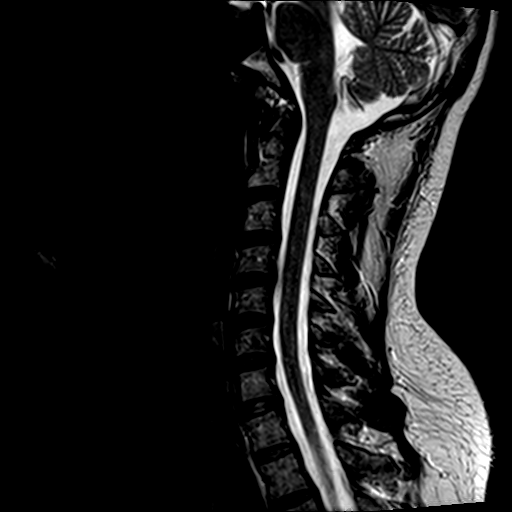
[im 10/13]
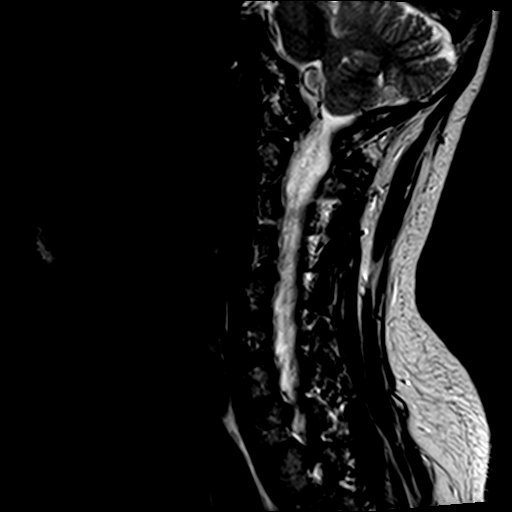
[im 13/13]
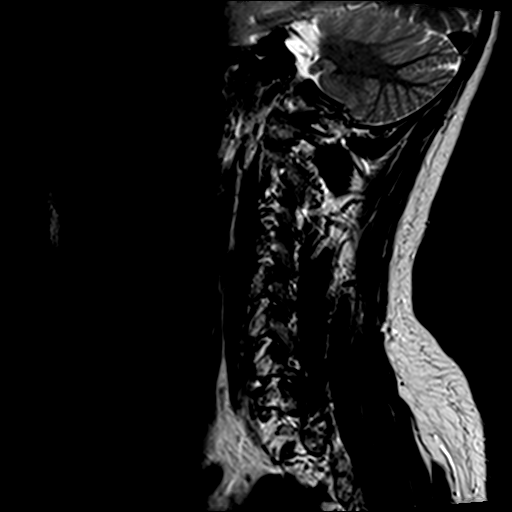

[Series 4: STIR · sagittal · 3.0mm · 0.82mm/px · 6 of 13 slices shown]
[im 1/13]
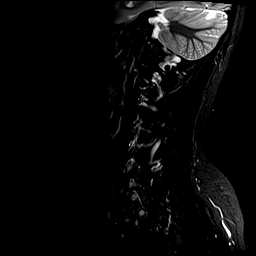
[im 3/13]
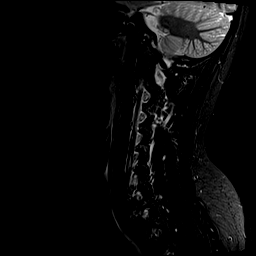
[im 5/13]
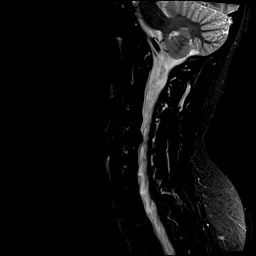
[im 8/13]
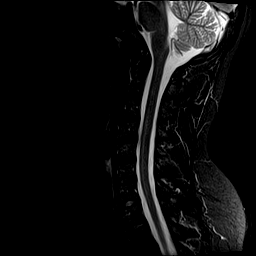
[im 10/13]
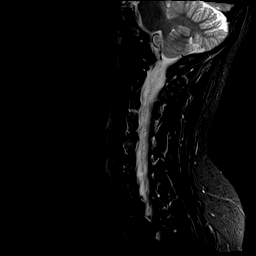
[im 13/13]
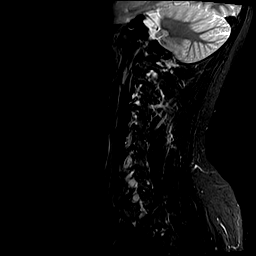

[Series 5: T2 · axial · 3.0mm · 0.39mm/px · z∈[-62,+51]mm · 9 of 34 slices shown (1 of 2)]
[im 1/34]
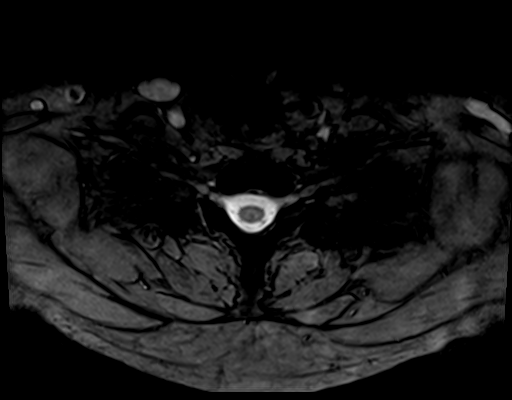
[im 5/34]
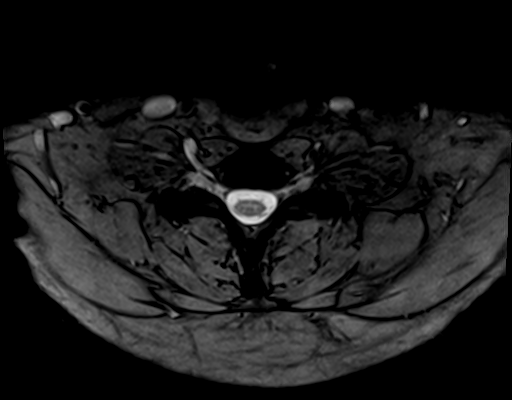
[im 10/34]
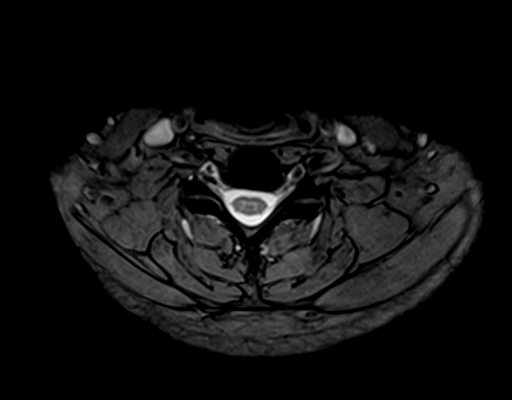
[im 15/34]
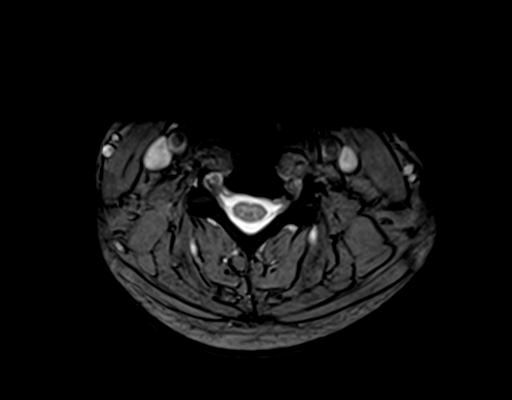
[im 17/34]
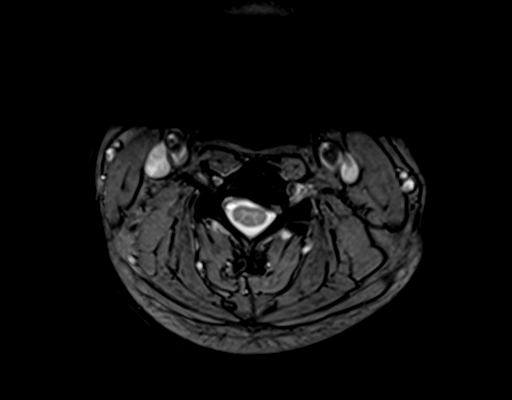
[im 19/34]
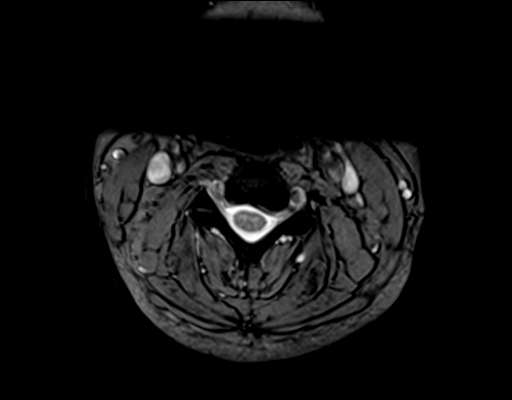
[im 24/34]
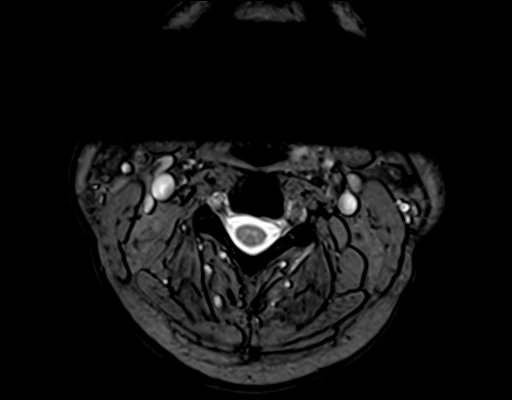
[im 29/34]
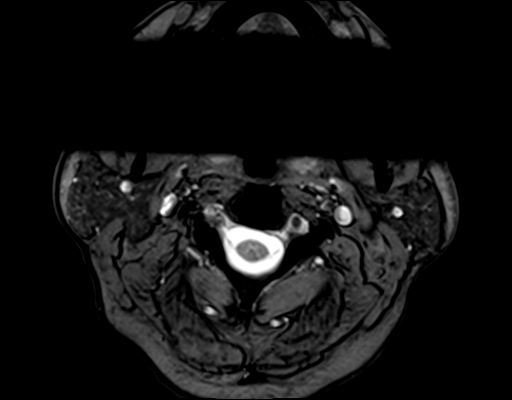
[im 34/34]
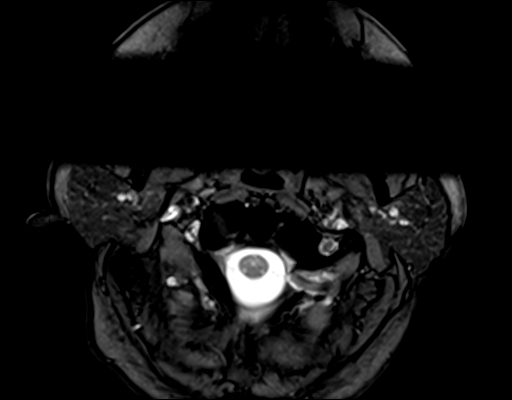

[Series 6: T2 · axial · 3.0mm · 0.62mm/px · z∈[-66,+47]mm · 9 of 34 slices shown (2 of 2)]
[im 1/34]
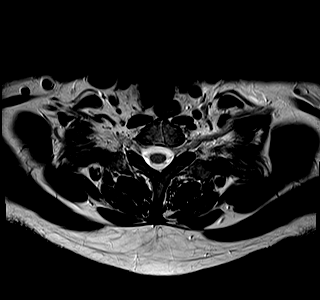
[im 5/34]
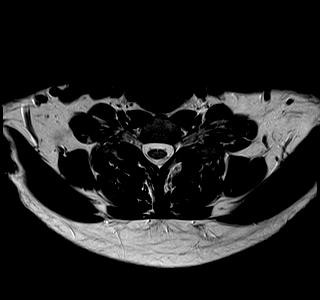
[im 10/34]
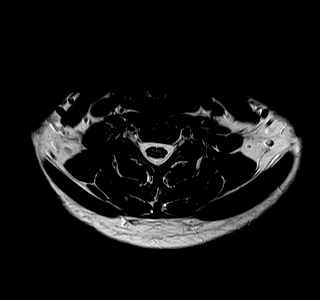
[im 15/34]
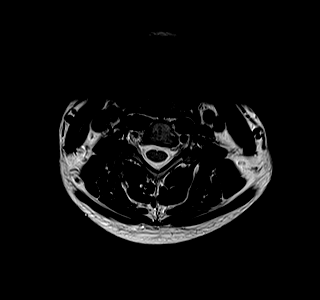
[im 17/34]
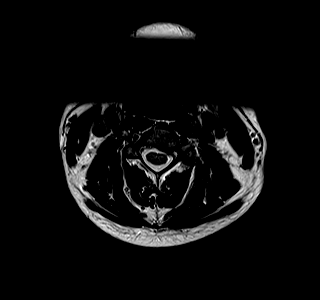
[im 19/34]
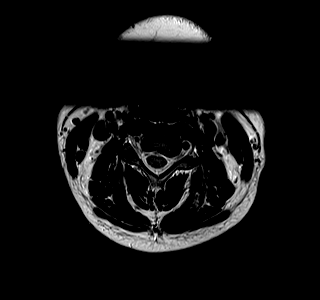
[im 24/34]
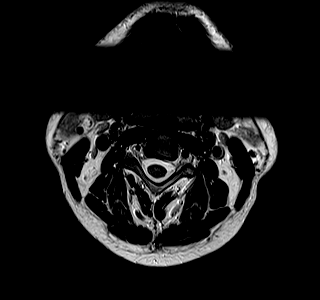
[im 29/34]
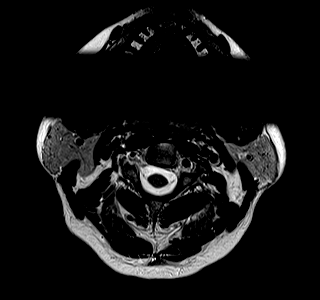
[im 34/34]
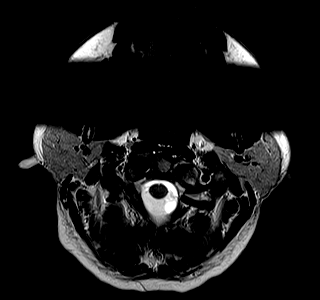

[30 of 48 positions shown; findings below may reference images not displayed]

FINDINGS: Alignment: Mild straightening of the normal cervical lordosis. No
listhesis.

Vertebrae: Vertebral body heights well maintained without evidence
for acute or chronic fracture. Bone marrow signal intensity within
normal limits. No discrete or worrisome osseous lesions. No abnormal
marrow edema.

Cord: Signal intensity within the cervical spinal cord is normal.

Posterior Fossa, vertebral arteries, paraspinal tissues: Visualized
brain and posterior fossa within normal limits. Craniocervical
junction normal. Paraspinous and prevertebral soft tissues within
normal limits. Normal intravascular flow voids present within the
vertebral arteries bilaterally.

Disc levels:

C2-C3: Unremarkable.

C3-C4:  Unremarkable.

C4-C5: Left paracentral/foraminal disc osteophyte complex indents
the left ventral thecal sac. Minimal flattening of the left ventral
hemi cord without cord signal changes. Left C5 nerve root could
potentially be affected. Moderate left foraminal stenosis. Mild
right-sided uncovertebral hypertrophy with minimal flattening of the
right ventral CSF and borderline mild right foraminal stenosis.

C5-C6:  Minimal annular disc bulge.  No canal or foraminal stenosis.

C6-C7:  Minimal uncovertebral hypertrophy.  No stenosis.

C7-T1:  Unremarkable.

Visualized upper thoracic spine demonstrates no significant
findings.
IMPRESSION: 1. Left paracentral/foraminal disc osteophyte complex at C4-5 with
resultant moderate left C5 foraminal stenosis.
2. Additional minor degenerative changes as above. No findings to
explain patient's right-sided symptoms identified.

## 2019-12-08 ENCOUNTER — Other Ambulatory Visit: Payer: Self-pay | Admitting: Medical

## 2019-12-23 ENCOUNTER — Other Ambulatory Visit: Payer: Self-pay | Admitting: Medical

## 2020-01-19 ENCOUNTER — Other Ambulatory Visit: Payer: Self-pay | Admitting: Medical

## 2020-01-28 ENCOUNTER — Encounter: Payer: Self-pay | Admitting: Family Medicine

## 2020-01-28 ENCOUNTER — Other Ambulatory Visit: Payer: Self-pay

## 2020-01-28 ENCOUNTER — Ambulatory Visit (INDEPENDENT_AMBULATORY_CARE_PROVIDER_SITE_OTHER): Payer: 59 | Admitting: Family Medicine

## 2020-01-28 VITALS — BP 148/96 | HR 90 | Temp 98.5°F | Wt 181.0 lb

## 2020-01-28 DIAGNOSIS — M549 Dorsalgia, unspecified: Secondary | ICD-10-CM

## 2020-01-28 NOTE — Patient Instructions (Signed)
600 mg of ibuprofen 4 times per day you can also take 2 Tylenol 4 times per day proper posturing Heat for 20 minutes 3 times per day and gentle stretching after that

## 2020-01-28 NOTE — Progress Notes (Signed)
   Subjective:    Patient ID: Cristian Walker, male    DOB: 05-12-1981, 39 y.o.   MRN: [id number]  HPI He complains of a 2-week history of mid low back pain.  Over the weekend he did help his mother move and noted that the pain was more on the right side.  He does state that it radiates into the right thigh area.  No numbness tingling or weakness.  He has been using 600 mg of ibuprofen every 4 hours.   Review of Systems     Objective:   Physical Exam Alert and complaining of back pain.  Tender to palpation over the lower lumbar and paravertebral muscles bilaterally.  Normal lumbar curve and motion.  No tenderness over SI joints or sciatic notch.  DTRs normal.  Strength normal.  Negative straight leg raising.       Assessment & Plan:  Musculoskeletal back pain Recommend 600 mg ibuprofen 4 times per day as well as 2 Tylenol 4 times per day, heat for 20 minutes 3 times per day with gentle stretching.  He will call if continued difficulty.  He was comfortable with that

## 2020-02-10 ENCOUNTER — Other Ambulatory Visit: Payer: Self-pay | Admitting: Medical

## 2020-02-11 NOTE — Telephone Encounter (Signed)
Due for a physical.  Schedule, refill #30

## 2020-02-20 ENCOUNTER — Other Ambulatory Visit: Payer: Self-pay | Admitting: Medical

## 2020-03-07 ENCOUNTER — Other Ambulatory Visit: Payer: Self-pay | Admitting: Medical

## 2020-03-10 ENCOUNTER — Encounter: Payer: Self-pay | Admitting: Medical

## 2020-03-10 ENCOUNTER — Other Ambulatory Visit: Payer: Self-pay | Admitting: Medical

## 2020-03-10 ENCOUNTER — Other Ambulatory Visit: Payer: Self-pay

## 2020-03-10 ENCOUNTER — Ambulatory Visit (INDEPENDENT_AMBULATORY_CARE_PROVIDER_SITE_OTHER): Payer: 59 | Admitting: Medical

## 2020-03-10 VITALS — BP 120/86 | HR 90 | Ht 67.0 in | Wt 183.6 lb

## 2020-03-10 DIAGNOSIS — I10 Essential (primary) hypertension: Secondary | ICD-10-CM

## 2020-03-10 DIAGNOSIS — Z8739 Personal history of other diseases of the musculoskeletal system and connective tissue: Secondary | ICD-10-CM

## 2020-03-10 DIAGNOSIS — Z79899 Other long term (current) drug therapy: Secondary | ICD-10-CM | POA: Insufficient documentation

## 2020-03-10 DIAGNOSIS — Z23 Encounter for immunization: Secondary | ICD-10-CM | POA: Insufficient documentation

## 2020-03-10 DIAGNOSIS — M545 Low back pain, unspecified: Secondary | ICD-10-CM | POA: Insufficient documentation

## 2020-03-10 DIAGNOSIS — Z0000 Encounter for general adult medical examination without abnormal findings: Secondary | ICD-10-CM

## 2020-03-10 DIAGNOSIS — Z8249 Family history of ischemic heart disease and other diseases of the circulatory system: Secondary | ICD-10-CM

## 2020-03-10 DIAGNOSIS — Z8709 Personal history of other diseases of the respiratory system: Secondary | ICD-10-CM | POA: Diagnosis not present

## 2020-03-10 DIAGNOSIS — E782 Mixed hyperlipidemia: Secondary | ICD-10-CM

## 2020-03-10 DIAGNOSIS — T50905A Adverse effect of unspecified drugs, medicaments and biological substances, initial encounter: Secondary | ICD-10-CM | POA: Insufficient documentation

## 2020-03-10 NOTE — Progress Notes (Signed)
Subjective:   HPI  Cristian Walker is a 39 y.o. male who presents for Chief Complaint  Patient presents with  . Annual Exam    with fasting labs     Patient Care Team: Chasta Deshpande, Kermit Balo, PA-C as PCP - General (Family Medicine) Sees dentist Sees eye doctor  Concerns: Doing well in general but does have some low back pain.  He was seen here recently for the same.  He continues to have some pain down the right low back into the right thigh.  No numbness or tingling, no fever, no blood in the stool, no saddle anesthesia, no falls.  He is compliant with blood pressure and cholesterol medicine daily.  No chest pain or dyspnea on exertion, no swelling in the legs.  He has questions about the Covid vaccine  Reviewed their medical, surgical, family, social, medication, and allergy history and updated chart as appropriate.  Past Medical History:  Diagnosis Date  . HNP (herniated nucleus pulposus), cervical    C4-5  . Hyperlipidemia   . Hypertension   . Impaired fasting blood sugar   . Spontaneous pneumothorax    3 times, 2009, 2007, 2005    Past Surgical History:  Procedure Laterality Date  . ANTERIOR CERVICAL DECOMP/DISCECTOMY FUSION N/A 11/23/2017   Procedure: C4-5 ANTERIOR CERVICAL DECOMPRESSION/DISCECTOMY FUSION, ALLOGRAFT, PLATE;  Surgeon: Eldred Manges, MD;  Location: [facility] OR;  Service: Orthopedics;  Laterality: N/A;  . HERNIA REPAIR     Right Inguinal  . LUNG SURGERY  2009, 2007, 2005   spontaneous pneumothorax x 3 surgeries    Family History  Problem Relation Age of Onset  . Hypertension Mother   . Heart disease Father 50       stent  . Alcohol abuse Father   . Cancer Maternal Grandfather        lung, asbestos     Current Outpatient Medications:  .  quinapril (ACCUPRIL) 10 MG tablet, TAKE 1 TABLET BY MOUTH EVERY DAY, Disp: 30 tablet, Rfl: 0 .  rosuvastatin (CRESTOR) 20 MG tablet, TAKE 1 TABLET BY MOUTH EVERYDAY AT BEDTIME, Disp: 90 tablet, Rfl: 1  No Known  Allergies     Review of Systems Constitutional: -fever, -chills, -sweats, -unexpected weight change, -decreased appetite, -fatigue Allergy: -sneezing, -itching, -congestion Dermatology: -changing moles, --rash, -lumps ENT: -runny nose, -ear pain, -sore throat, -hoarseness, -sinus pain, -teeth pain, - ringing in ears, -hearing loss, -nosebleeds Cardiology: -chest pain, -palpitations, -swelling, -difficulty breathing when lying flat, -waking up short of breath Respiratory: -cough, -shortness of breath, -difficulty breathing with exercise or exertion, -wheezing, -coughing up blood Gastroenterology: -abdominal pain, -nausea, -vomiting, -diarrhea, -constipation, -blood in stool, -changes in bowel movement, -difficulty swallowing or eating Hematology: -bleeding, -bruising  Musculoskeletal: -joint aches, -muscle aches, -joint swelling, +back pain, -neck pain, -cramping, -changes in gait Ophthalmology: denies vision changes, eye redness, itching, discharge Urology: -burning with urination, -difficulty urinating, -blood in urine, -urinary frequency, -urgency, -incontinence Neurology: -headache, -weakness, -tingling, -numbness, -memory loss, -falls, -dizziness Psychology: -depressed mood, -agitation, -sleep problems Male GU: no testicular mass, pain, no lymph nodes swollen, no swelling, no rash.     Objective:  BP 120/86   Pulse 90   Ht 5\' 7"  (1.702 m)   Wt 183 lb 9.6 oz (83.3 kg)   SpO2 97%   BMI 28.76 kg/m   General appearance: alert, no distress, WD/WN, Caucasian male Skin: No worrisome lesions HEENT: normocephalic, conjunctiva/corneas normal, sclerae anicteric, PERRLA, EOMi, nares patent, no discharge or erythema,  pharynx normal Oral cavity: MMM, tongue normal, teeth normal Neck: supple, no lymphadenopathy, no thyromegaly, no masses, normal ROM, no bruits Chest: non tender, normal shape and expansion Heart: RRR, normal S1, S2, no murmurs Lungs: CTA bilaterally, no wheezes, rhonchi, or  rales Abdomen: +bs, soft, non tender, non distended, no masses, no hepatomegaly, no splenomegaly, no bruits Back: non tender, normal ROM, no scoliosis Musculoskeletal: upper extremities non tender, no obvious deformity, normal ROM throughout, lower extremities non tender, no obvious deformity, normal ROM throughout Extremities: no edema, no cyanosis, no clubbing Pulses: 2+ symmetric, upper and lower extremities, normal cap refill Neurological: alert, oriented x 3, CN2-12 intact, strength normal upper extremities and lower extremities, sensation normal throughout, DTRs 2+ throughout, no cerebellar signs, gait normal Psychiatric: normal affect, behavior normal, pleasant  GU: normal male external genitalia,circumcised, nontender, right testicle superiorly with small pinpoint area of tenderness but no obvious mass, no masses, no hernia, no lymphadenopathy Rectal: Deferred   Assessment and Plan :   Encounter Diagnoses  Name Primary?  . Routine general medical examination at a health care facility Yes  . Essential hypertension, benign   . Mixed dyslipidemia   . History of pneumothorax   . Family history of premature CAD   . History of spinal stenosis   . Acute right-sided low back pain without sciatica   . High risk medication use   . Need for vaccination   . Adverse effect of drug, initial encounter     Physical exam - discussed and counseled on healthy lifestyle, diet, exercise, preventative care, vaccinations, sick and well care, proper use of emergency dept and after hours care, and addressed their concerns.     Health screening: See your eye doctor yearly for routine vision care. See your dentist yearly for routine dental care including hygiene visits twice yearly.  Cancer screening Advised monthly self testicular exam    Vaccinations: Advised yearly influenza vaccine He gets this at work free  Up to date on Td  Counseled on the [employer] vaccine.  Vaccine information  sheet given.  Covid vaccine given after consent obtained.  F/u 28 days for #2 booster.   Separate significant issues discussed: Back pain   Hypertension-continue current medication  Hyperlipidemia - continue current medicine  Family history of premature CAD-discussed baseline screening at some point moving forward.  He will consider   Father had stents around age 66  He is concerned about statin effect causing some of his leg pain.  I think his symptoms are more to do with radicular issue but discussed possible modification of therapy to see if this helps.  Consider statin along with aspirin or coenzyme Q 10.  Consider statin only 3 days/week.  Consider baseline imaging of low back    Markevius was seen today for annual exam.  Diagnoses and all orders for this visit:  Routine general medical examination at a health care facility -     Comprehensive metabolic panel -     CBC with Differential/Platelet -     Lipid panel -     CK  Essential hypertension, benign  Mixed dyslipidemia -     Lipid panel  History of pneumothorax  Family history of premature CAD  History of spinal stenosis  Acute right-sided low back pain without sciatica -     CK  High risk medication use -     CK  Need for vaccination -     Pfizer SARS-COV-2 Vaccine  Adverse effect of drug,  initial encounter    Follow-up pending labs, yearly for physical

## 2020-03-11 ENCOUNTER — Other Ambulatory Visit: Payer: Self-pay | Admitting: Medical

## 2020-03-11 LAB — CBC WITH DIFFERENTIAL/PLATELET
Basophils Absolute: 0 10*3/uL (ref 0.0–0.2)
Basos: 1 %
EOS (ABSOLUTE): 0.1 10*3/uL (ref 0.0–0.4)
Eos: 2 %
Hematocrit: 47.5 % (ref 37.5–51.0)
Hemoglobin: 16.1 g/dL (ref 13.0–17.7)
Immature Grans (Abs): 0 10*3/uL (ref 0.0–0.1)
Immature Granulocytes: 0 %
Lymphocytes Absolute: 1.8 10*3/uL (ref 0.7–3.1)
Lymphs: 32 %
MCH: 29.3 pg (ref 26.6–33.0)
MCHC: 33.9 g/dL (ref 31.5–35.7)
MCV: 87 fL (ref 79–97)
Monocytes Absolute: 0.5 10*3/uL (ref 0.1–0.9)
Monocytes: 9 %
Neutrophils Absolute: 3.2 10*3/uL (ref 1.4–7.0)
Neutrophils: 56 %
Platelets: 271 10*3/uL (ref 150–450)
RBC: 5.49 x10E6/uL (ref 4.14–5.80)
RDW: 12.6 % (ref 11.6–15.4)
WBC: 5.6 10*3/uL (ref 3.4–10.8)

## 2020-03-11 LAB — COMPREHENSIVE METABOLIC PANEL
ALT: 32 IU/L (ref 0–44)
AST: 19 IU/L (ref 0–40)
Albumin/Globulin Ratio: 2.1 (ref 1.2–2.2)
Albumin: 4.9 g/dL (ref 4.0–5.0)
Alkaline Phosphatase: 98 IU/L (ref 44–121)
BUN/Creatinine Ratio: 17 (ref 9–20)
BUN: 16 mg/dL (ref 6–24)
Bilirubin Total: 0.5 mg/dL (ref 0.0–1.2)
CO2: 26 mmol/L (ref 20–29)
Calcium: 9.8 mg/dL (ref 8.7–10.2)
Chloride: 103 mmol/L (ref 96–106)
Creatinine, Ser: 0.96 mg/dL (ref 0.76–1.27)
GFR calc Af Amer: 113 mL/min/{1.73_m2} (ref >59)
GFR calc non Af Amer: 98 mL/min/{1.73_m2} (ref >59)
Globulin, Total: 2.3 g/dL (ref 1.5–4.5)
Glucose: 93 mg/dL (ref 65–99)
Potassium: 4.6 mmol/L (ref 3.5–5.2)
Sodium: 142 mmol/L (ref 134–144)
Total Protein: 7.2 g/dL (ref 6.0–8.5)

## 2020-03-11 LAB — LIPID PANEL
Chol/HDL Ratio: 3.4 ratio (ref 0.0–5.0)
Cholesterol, Total: 134 mg/dL (ref 100–199)
HDL: 39 mg/dL — ABNORMAL LOW (ref >39)
LDL Chol Calc (NIH): 71 mg/dL (ref 0–99)
Triglycerides: 139 mg/dL (ref 0–149)
VLDL Cholesterol Cal: 24 mg/dL (ref 5–40)

## 2020-03-11 LAB — CK: Total CK: 323 U/L (ref 49–439)

## 2020-03-11 MED ORDER — ROSUVASTATIN CALCIUM 20 MG PO TABS: ORAL_TABLET | ORAL | 3 refills | Status: DC

## 2020-03-11 MED ORDER — QUINAPRIL HCL 10 MG PO TABS: 10.0000 mg | ORAL_TABLET | Freq: Every day | ORAL | 3 refills | Status: DC

## 2020-03-31 ENCOUNTER — Other Ambulatory Visit: Payer: Self-pay

## 2020-03-31 ENCOUNTER — Ambulatory Visit (INDEPENDENT_AMBULATORY_CARE_PROVIDER_SITE_OTHER): Payer: 59

## 2020-03-31 DIAGNOSIS — Z23 Encounter for immunization: Secondary | ICD-10-CM | POA: Diagnosis not present

## 2020-09-25 ENCOUNTER — Encounter: Payer: Self-pay | Admitting: Medical

## 2020-09-25 ENCOUNTER — Ambulatory Visit (INDEPENDENT_AMBULATORY_CARE_PROVIDER_SITE_OTHER): Payer: 59 | Admitting: Medical

## 2020-09-25 ENCOUNTER — Other Ambulatory Visit: Payer: Self-pay

## 2020-09-25 VITALS — BP 130/88 | HR 82 | Temp 98.6°F | Ht 67.0 in | Wt 179.8 lb

## 2020-09-25 DIAGNOSIS — M7052 Other bursitis of knee, left knee: Secondary | ICD-10-CM | POA: Diagnosis not present

## 2020-09-25 DIAGNOSIS — M25562 Pain in left knee: Secondary | ICD-10-CM | POA: Diagnosis not present

## 2020-09-25 MED ORDER — HYDROCODONE-ACETAMINOPHEN 5-325 MG PO TABS: 1.0000 | ORAL_TABLET | Freq: Four times a day (QID) | ORAL | 0 refills | Status: DC | PRN

## 2020-09-25 NOTE — Progress Notes (Signed)
Subjective:  Cristian Walker is a 40 y.o. male who presents for Chief Complaint  Patient presents with  . Knee Pain    Pt present for left knee pain       Been having left knee pain.   Started about 6 days ago.  No specific fall or injury.   Has pointy knees, prior injuries on skateboard years ago.  Been using knee pads at work.  Had to crawl under his house to fix leak last week,and had to get on knees to do some other work this past week.  Getting sharp pains in knee, throbbing pain.  No knee swelling.  Feels warm, but no redness.   Using aleve in morning, and some tylenol at other times.  No fever.  No other aggravating or relieving factors.  No other c/o.   Past Medical History:  Diagnosis Date  . HNP (herniated nucleus pulposus), cervical    C4-5  . Hyperlipidemia   . Hypertension   . Impaired fasting blood sugar   . Spontaneous pneumothorax    3 times, 2009, 2007, 2005   Past Surgical History:  Procedure Laterality Date  . ANTERIOR CERVICAL DECOMP/DISCECTOMY FUSION N/A 11/23/2017   Procedure: C4-5 ANTERIOR CERVICAL DECOMPRESSION/DISCECTOMY FUSION, ALLOGRAFT, PLATE;  Surgeon: Eldred Manges, MD;  Location: [facility] OR;  Service: Orthopedics;  Laterality: N/A;  . HERNIA REPAIR     Right Inguinal  . LUNG SURGERY  2009, 2007, 2005   spontaneous pneumothorax x 3 surgeries     The following portions of the patient's history were reviewed and updated as appropriate: allergies, current medications, past family history, past medical history, past social history, past surgical history and problem list.  ROS Otherwise as in subjective above    Objective: BP 130/88   Pulse 82   Temp 98.6 F (37 C)   Ht 5\' 7"  (1.702 m)   Wt 179 lb 12.8 oz (81.6 kg)   SpO2 97%   BMI 28.16 kg/m   General appearance: alert, no distress, well developed, well nourished Left knee with tenderness over patella, tenderness over the infrapatellar region and patellar tendon, mild tenderness over the medial  joint line, seems to be guarded with the knee in general, mild pain with full extension, not really having pain with flexion, no laxity, no significant swelling.  No warmth, no erythema.  Tender with valgus and varus stress but no laxity.  But the tenderness is more located over the patella tendon patellar tendon region/bursa region.  Rest of leg nontender normal range of motion Legs neurovascularly intact   Assessment: Encounter Diagnoses  Name Primary?  . Bursitis of left knee, unspecified bursa Yes  . Acute pain of left knee      Plan: Discussed symptoms and concerns.  Symptoms suggestive of bursitis of left knee.  Discussed the following recommendations.  No obvious signs of septic knee.  Advise if any worsening changes over the next few days and get rechecked immediately.  Recommendations:  I recommend you take 2 Aleve OTC 220mg  tablets either once or twice daily for the next 5-7 days  REST the leg  Use ice water therapy, 20 minutes on , 20 minutes off the next 3-4 days  Elevate the leg  AVOID being down on the knees for the next week if possible  You may use Norco as needed for breakthrough pain over the next few days  If worse redness, worse pain, warmth, fever, then get re-evaluated immediately  If much improved  within the next 5 days then can gradually resume activity.  His work does require squatting and bending down on the knees so he needs to refrain from this for at least the next 5 days  Work note given  [name] was seen today for knee pain.  Diagnoses and all orders for this visit:  Bursitis of left knee, unspecified bursa  Acute pain of left knee  Other orders -     HYDROcodone-acetaminophen (NORCO) 5-325 MG tablet; Take 1 tablet by mouth every 6 (six) hours as needed for moderate pain.    Follow up: prn

## 2020-09-25 NOTE — Patient Instructions (Signed)
Recommendations:  I recommend you take 2 Aleve OTC 220mg  tablets either once or twice daily for the next 5-7 days  REST the leg  Use ice water therapy, 20 minutes on , 20 minutes off the next 3-4 days  Elevate the leg  AVOID being down on the knees for the next week if possible  You may use Norco as needed for breakthrough pain over the next few days  If worse redness, worse pain, warmth, fever, then get re-evaluated immediately    Prepatellar Bursitis  Prepatellar bursitis is inflammation of the prepatellar bursa, which is a fluid-filled sac that cushions the kneecap (patella). Prepatellar bursitis happens when fluid builds up in this sac and causes it to swell. The condition causes knee pain. What are the causes? This condition may be caused by:  Constant pressure on the knees from kneeling.  A hit to the knee.  Falling on the knee.  Infection from bacteria.  Moving the knee often in a forceful way. What increases the risk? You are more likely to develop this condition if:  You play sports that have a high risk of falling on the knee or being hit on the knee. These include football, wrestling, basketball, or soccer.  You do work in which you kneel for long periods of time, such as roofing, plumbing, or gardening.  You have another inflammatory condition, such as gout or rheumatoid arthritis. What are the signs or symptoms? The most common symptom of this condition is knee pain that gets better with rest. Other symptoms include:  Swelling on the front of the kneecap.  Warmth in the knee.  Tenderness with activity.  Redness in the knee.  Inability to bend the knee or to kneel. How is this diagnosed? This condition is diagnosed based on:  A physical exam. Your health care provider will compare your knees and check for tenderness and pain while moving your knee.  Your medical history.  Tests to check for infection. These may include blood tests and tests on  the fluid in the bursa.  Imaging tests, such as X-ray, MRI, or ultrasound, to check for damage in the patella, or fluid buildup and swelling in the bursa. How is this treated? This condition may be treated by:  Resting the knee.  Putting ice on the knee.  Taking medicines, such as: ? NSAIDs. These can help to reduce pain and swelling. ? Antibiotics. These may be needed if you have an infection. ? Steroids. These are used to reduce swelling and inflammation, and may be prescribed if other treatments are not helping.  Raising (elevating) the knee while resting.  Doing exercises to help you maintain movement (physical therapy). These may be recommended after pain and swelling improve.  Having a procedure to remove fluid from the bursa. This may be done if other treatments are not helping.  Having surgery to remove the bursa. This may be done if you have a severe infection or if the condition keeps coming back after treatment. Follow these instructions at home: Medicines  Take over-the-counter and prescription medicines only as told by your health care provider.  If you were prescribed an antibiotic medicine, take it as told by your health care provider. Do not stop taking the antibiotic even if you start to feel better. Managing pain, stiffness, and swelling  If directed, put ice on the injured area. ? Put ice in a plastic bag. ? Place a towel between your skin and the bag. ? Leave the ice  on for 20 minutes, 2-3 times a day.  Elevate the injured area above the level of your heart while you are sitting or lying down.   Activity  Do not use the injured limb to support your body weight until your health care provider says that you can.  Rest your knee.  Avoid activities that cause knee pain.  Return to your normal activities as told by your health care provider. Ask your health care provider what activities are safe for you.  Do exercises as told by your health care  provider. General instructions  Ask your health care provider when it is safe for you to drive.  Do not use any products that contain nicotine or tobacco, such as cigarettes, e-cigarettes, and chewing tobacco. These can delay healing. If you need help quitting, ask your health care provider.  Keep all follow-up visits as told by your health care provider. This is important. How is this prevented?  Warm up and stretch before being active.  Cool down and stretch after being active.  Give your body time to rest between periods of activity.  Maintain physical fitness, including strength and flexibility.  Be safe and responsible while being active. This will help you to avoid falls.  Wear knee pads if you have to kneel for a long period of time. Contact a health care provider if:  Your symptoms do not improve or get worse.  Your symptoms keep coming back after treatment.  You develop a fever and have warmth, redness, or swelling over your knee. Summary  Prepatellar bursitis is inflammation of the prepatellar bursa, which is a fluid-filled sac that cushions the kneecap (patella).  This condition may be caused by injury or constant pressure on the knee. It may also be caused by an infection from bacteria.  Symptoms of this condition include pain, swelling, warmth, and tenderness in the knee.  Follow instructions from your health care provider about taking medicines, resting, and doing activities.  Contact your health care provider if your symptoms do not improve, get worse, or keep coming back after treatment. This information is not intended to replace advice given to you by your health care provider. Make sure you discuss any questions you have with your health care provider. Document Revised: 09/29/2018 Document Reviewed: 08/17/2018 Elsevier Patient Education  2021 [employer].

## 2020-10-01 ENCOUNTER — Other Ambulatory Visit: Payer: Self-pay

## 2020-10-16 ENCOUNTER — Telehealth: Payer: Self-pay | Admitting: Medical

## 2020-10-16 ENCOUNTER — Other Ambulatory Visit: Payer: Self-pay | Admitting: Medical

## 2020-10-16 DIAGNOSIS — M25562 Pain in left knee: Secondary | ICD-10-CM

## 2020-10-16 NOTE — Telephone Encounter (Signed)
Pt called and states he is having knee pain again. He states it got better for a while but pain is right back as it was before. Please advise pt at [phone]. Pt uses [facility] [address]. Pt states he sent a mychart message too.

## 2020-10-16 NOTE — Telephone Encounter (Signed)
Have him go for left knee x-ray at [facility] imaging.  Have him make a follow-up with me or Dr. Susann Givens.  He potentially may need a knee injection and and Dr. Susann Givens would be better suited for this

## 2020-10-17 ENCOUNTER — Ambulatory Visit
Admission: RE | Admit: 2020-10-17 | Discharge: 2020-10-17 | Disposition: A | Discharge status: Home / Self Care | Payer: 59 | Source: Ambulatory Visit | Attending: Medical | Admitting: Medical

## 2020-10-17 DIAGNOSIS — M25562 Pain in left knee: Secondary | ICD-10-CM

## 2020-10-17 NOTE — Telephone Encounter (Signed)
Done

## 2021-03-15 ENCOUNTER — Other Ambulatory Visit: Payer: Self-pay | Admitting: Medical

## 2021-04-08 ENCOUNTER — Other Ambulatory Visit: Payer: Self-pay | Admitting: Medical

## 2021-05-28 ENCOUNTER — Encounter: Payer: Self-pay | Admitting: Medical

## 2021-05-28 ENCOUNTER — Other Ambulatory Visit: Payer: Self-pay

## 2021-05-28 ENCOUNTER — Ambulatory Visit (INDEPENDENT_AMBULATORY_CARE_PROVIDER_SITE_OTHER): Payer: 59 | Admitting: Medical

## 2021-05-28 VITALS — BP 122/86 | HR 81 | Ht 66.0 in | Wt 173.0 lb

## 2021-05-28 DIAGNOSIS — Z23 Encounter for immunization: Secondary | ICD-10-CM | POA: Diagnosis not present

## 2021-05-28 DIAGNOSIS — I10 Essential (primary) hypertension: Secondary | ICD-10-CM | POA: Diagnosis not present

## 2021-05-28 DIAGNOSIS — M545 Low back pain, unspecified: Secondary | ICD-10-CM

## 2021-05-28 DIAGNOSIS — E785 Hyperlipidemia, unspecified: Secondary | ICD-10-CM

## 2021-05-28 DIAGNOSIS — Z136 Encounter for screening for cardiovascular disorders: Secondary | ICD-10-CM | POA: Diagnosis not present

## 2021-05-28 DIAGNOSIS — R7301 Impaired fasting glucose: Secondary | ICD-10-CM | POA: Insufficient documentation

## 2021-05-28 DIAGNOSIS — Z0000 Encounter for general adult medical examination without abnormal findings: Secondary | ICD-10-CM | POA: Diagnosis not present

## 2021-05-28 DIAGNOSIS — Z8249 Family history of ischemic heart disease and other diseases of the circulatory system: Secondary | ICD-10-CM

## 2021-05-28 DIAGNOSIS — G8929 Other chronic pain: Secondary | ICD-10-CM | POA: Insufficient documentation

## 2021-05-28 MED ORDER — ROSUVASTATIN CALCIUM 20 MG PO TABS: 20.0000 mg | ORAL_TABLET | Freq: Every day | ORAL | 3 refills | Status: DC

## 2021-05-28 MED ORDER — QUINAPRIL HCL 10 MG PO TABS: 10.0000 mg | ORAL_TABLET | Freq: Every day | ORAL | 3 refills | Status: DC

## 2021-05-28 NOTE — Progress Notes (Signed)
Subjective:   HPI  Cristian Walker is a 40 y.o. male who presents for Chief Complaint  Patient presents with   Annual Exam    Patient Care Team: Adair Lemar, Cleda Mccreedy as PCP - General (Family Medicine) Sees dentist Sees eye doctor  Concerns: Has herniated lumbar disc.  Has had EDSI, had EMG/NCS today.  May be having surgery soon.   Compliant with blood pressure and cholesterol medication but almost out of medication.  No chest pain no difficulty breathing no edema.  Reviewed their medical, surgical, family, social, medication, and allergy history and updated chart as appropriate.  Past Medical History:  Diagnosis Date   HNP (herniated nucleus pulposus), cervical    C4-5   Hyperlipidemia    Hypertension    Spontaneous pneumothorax    3 times, 2009, 2007, 2005    Past Surgical History:  Procedure Laterality Date   ANTERIOR CERVICAL DECOMP/DISCECTOMY FUSION N/A 11/23/2017   Procedure: C4-5 ANTERIOR CERVICAL DECOMPRESSION/DISCECTOMY FUSION, ALLOGRAFT, PLATE;  Surgeon: Eldred Manges, MD;  Location: [facility] OR;  Service: Orthopedics;  Laterality: N/A;   HERNIA REPAIR     Right Inguinal   LUNG SURGERY  2009, 2007, 2005   spontaneous pneumothorax x 3 surgeries    Family History  Problem Relation Age of Onset   Hypertension Mother    Heart disease Father 19       stent   Alcohol abuse Father    Heart disease Paternal Uncle        CABG   Cancer Maternal Grandfather        lung, asbestos     Current Outpatient Medications:    tiZANidine HCl (ZANAFLEX PO), Take by mouth., Disp: , Rfl:    quinapril (ACCUPRIL) 10 MG tablet, Take 1 tablet (10 mg total) by mouth daily., Disp: 90 tablet, Rfl: 3   rosuvastatin (CRESTOR) 20 MG tablet, Take 1 tablet (20 mg total) by mouth daily., Disp: 90 tablet, Rfl: 3  No Known Allergies   Review of Systems Constitutional: -fever, -chills, -sweats, -unexpected weight change, -decreased appetite, -fatigue Allergy: -sneezing, -itching,  -congestion Dermatology: -changing moles, --rash, -lumps ENT: -runny nose, -ear pain, -sore throat, -hoarseness, -sinus pain, -teeth pain, - ringing in ears, -hearing loss, -nosebleeds Cardiology: -chest pain, -palpitations, -swelling, -difficulty breathing when lying flat, -waking up short of breath Respiratory: -cough, -shortness of breath, -difficulty breathing with exercise or exertion, -wheezing, -coughing up blood Gastroenterology: -abdominal pain, -nausea, -vomiting, -diarrhea, -constipation, -blood in stool, -changes in bowel movement, -difficulty swallowing or eating Hematology: -bleeding, -bruising  Musculoskeletal: -joint aches, -muscle aches, -joint swelling, +back pain, -neck pain, -cramping, -changes in gait Ophthalmology: denies vision changes, eye redness, itching, discharge Urology: -burning with urination, -difficulty urinating, -blood in urine, -urinary frequency, -urgency, -incontinence Neurology: -headache, -weakness, -tingling, -numbness, -memory loss, -falls, -dizziness Psychology: -depressed mood, -agitation, -sleep problems Male GU: no testicular mass, pain, no lymph nodes swollen, no swelling, no rash.  Depression screen [facility] 2/9 05/28/2021 03/10/2020 11/15/2018 03/04/2016  Decreased Interest 0 0 0 0  Down, Depressed, Hopeless 0 0 0 0  PHQ - 2 Score 0 0 0 0        Objective:  BP 122/86 (BP Location: Right Arm, Patient Position: Sitting)   Pulse 81   Ht 5\' 6"  (1.676 m)   Wt 173 lb (78.5 kg)   SpO2 98%   BMI 27.92 kg/m    General appearance: alert, no distress, WD/WN, Caucasian male Skin: Unremarkable HEENT: normocephalic, conjunctiva/corneas normal, sclerae anicteric,  PERRLA, EOMi, nares patent, no discharge or erythema, pharynx normal Oral cavity: MMM, tongue normal, teeth normal Neck: supple, no lymphadenopathy, no thyromegaly, no masses, normal ROM, no bruits Chest: non tender, normal shape and expansion Heart: RRR, normal S1, S2, no murmurs Lungs: CTA  bilaterally, no wheezes, rhonchi, or rales Abdomen: +bs, soft, non tender, non distended, no masses, no hepatomegaly, no splenomegaly, no bruits Back: Slow with movement due to low back pain, otherwise non tender, normal ROM, no scoliosis Musculoskeletal: upper extremities non tender, no obvious deformity, normal ROM throughout, lower extremities non tender, no obvious deformity, normal ROM throughout Extremities: no edema, no cyanosis, no clubbing Pulses: 2+ symmetric, upper and lower extremities, normal cap refill Neurological: alert, oriented x 3, CN2-12 intact, strength normal upper extremities and lower extremities, sensation normal throughout, DTRs 2+ throughout, no cerebellar signs, gait normal Psychiatric: normal affect, behavior normal, pleasant  GU: normal male external genitalia,circumcised, nontender, no masses, no hernia, no lymphadenopathy Rectal: Deferred  EKG Indication hypertension and screen for heart disease, rate 69 bpm, 138 ms PR, QRS 84 ms, QTC 411 ms, -3 axis , normal sinus rhythm    Assessment and Plan :   Encounter Diagnoses  Name Primary?   Encounter for health maintenance examination in adult Yes   Needs flu shot    Hyperlipidemia, unspecified hyperlipidemia type    Essential hypertension, benign    Chronic bilateral low back pain, unspecified whether sciatica present    Screening for heart disease    Family history of premature CAD     This visit was a preventative care visit, also known as wellness visit or routine physical.   Topics typically include healthy lifestyle, diet, exercise, preventative care, vaccinations, sick and well care, proper use of emergency dept and after hours care, as well as other concerns.     Recommendations: Continue to return yearly for your annual wellness and preventative care visits.  This gives [place] a chance to discuss healthy lifestyle, exercise, vaccinations, review your chart record, and perform screenings where  appropriate.  I recommend you see your eye doctor yearly for routine vision care.  I recommend you see your dentist yearly for routine dental care including hygiene visits twice yearly.   Vaccination recommendations were reviewed Immunization History  Administered Date(s) Administered   Influenza,inj,Quad PF,6+ Mos 05/28/2021   Influenza-Unspecified 02/25/2018   PFIZER(Purple Top)SARS-COV-2 Vaccination 03/10/2020, 03/31/2020   Tdap 01/08/2016    Counseled on the influenza virus vaccine.  Vaccine information sheet given.  Influenza vaccine given after consent obtained.    Screening for cancer: Colon cancer screening: Age 74  Testicular cancer screening You should do a monthly self testicular exam if you are between 26-63 years old  We discussed PSA, prostate exam, and prostate cancer screening risks/benefits.   Age 21  Skin cancer screening: Check your skin regularly for new changes, growing lesions, or other lesions of concern Come in for evaluation if you have skin lesions of concern.  Lung cancer screening: If you have a greater than 20 pack year history of tobacco use, then you may qualify for lung cancer screening with a chest CT scan.   Please call your insurance company to inquire about coverage for this test.  We currently don't have screenings for other cancers besides breast, cervical, colon, and lung cancers.  If you have a strong family history of cancer or have other cancer screening concerns, please let me know.    Bone health: Get at least 150 minutes of  aerobic exercise weekly Get weight bearing exercise at least once weekly Bone density test:  A bone density test is an imaging test that uses a type of X-ray to measure the amount of calcium and other minerals in your bones. The test may be used to diagnose or screen you for a condition that causes weak or thin bones (osteoporosis), predict your risk for a broken bone (fracture), or determine how well your  osteoporosis treatment is working. The bone density test is recommended for females 65 and older, or females or males <65 if certain risk factors such as thyroid disease, long term use of steroids such as for asthma or rheumatological issues, vitamin D deficiency, estrogen deficiency, family history of osteoporosis, self or family history of fragility fracture in first degree relative.    Heart health: Get at least 150 minutes of aerobic exercise weekly Limit alcohol It is important to maintain a healthy blood pressure and healthy cholesterol numbers  Heart disease screening: Screening for heart disease includes screening for blood pressure, fasting lipids, glucose/diabetes screening, BMI height to weight ratio, reviewed of smoking status, physical activity, and diet.    Goals include blood pressure 120/80 or less, maintaining a healthy lipid/cholesterol profile, preventing diabetes or keeping diabetes numbers under good control, not smoking or using tobacco products, exercising most days per week or at least 150 minutes per week of exercise, and eating healthy variety of fruits and vegetables, healthy oils, and avoiding unhealthy food choices like fried food, fast food, high sugar and high cholesterol foods.    Other tests may possibly include EKG test, CT coronary calcium score, echocardiogram, exercise treadmill stress test.   Consider CT coronary calcium score  Medical care options: I recommend you continue to seek care here first for routine care.  We try really hard to have available appointments Monday through Friday daytime hours for sick visits, acute visits, and physicals.  Urgent care should be used for after hours and weekends for significant issues that cannot wait till the next day.  The emergency department should be used for significant potentially life-threatening emergencies.  The emergency department is expensive, can often have long wait times for less significant concerns, so  try to utilize primary care, urgent care, or telemedicine when possible to avoid unnecessary trips to the emergency department.  Virtual visits and telemedicine have been introduced since the pandemic started in 2020, and can be convenient ways to receive medical care.  We offer virtual appointments as well to assist you in a variety of options to seek medical care.   Separate significant issues discussed: Hypertension-continue current medication, updated labs today  Hyperlipidemia-continue current medication, updated labs today  Chronic back pain-follow-up with neurosurgeon as planned   Nasario was seen today for annual exam.  Diagnoses and all orders for this visit:  Encounter for health maintenance examination in adult -     Comprehensive metabolic panel -     Lipid panel -     CBC -     Urinalysis, Routine w reflex microscopic  Needs flu shot -     Flu Vaccine QUAD 6+ mos PF IM (Fluarix Quad PF)  Hyperlipidemia, unspecified hyperlipidemia type -     Lipid panel  Essential hypertension, benign -     Comprehensive metabolic panel  Chronic bilateral low back pain, unspecified whether sciatica present  Screening for heart disease  Family history of premature CAD  Other orders -     rosuvastatin (CRESTOR) 20 MG tablet; Take  1 tablet (20 mg total) by mouth daily. -     quinapril (ACCUPRIL) 10 MG tablet; Take 1 tablet (10 mg total) by mouth daily.   Follow-up pending labs, yearly for physical

## 2021-05-29 ENCOUNTER — Other Ambulatory Visit: Payer: Self-pay | Admitting: Medical

## 2021-05-29 LAB — COMPREHENSIVE METABOLIC PANEL
ALT: 43 IU/L (ref 0–44)
AST: 27 IU/L (ref 0–40)
Albumin/Globulin Ratio: 2.6 — ABNORMAL HIGH (ref 1.2–2.2)
Albumin: 5.2 g/dL — ABNORMAL HIGH (ref 4.0–5.0)
Alkaline Phosphatase: 94 IU/L (ref 44–121)
BUN/Creatinine Ratio: 10 (ref 9–20)
BUN: 9 mg/dL (ref 6–24)
Bilirubin Total: 0.4 mg/dL (ref 0.0–1.2)
CO2: 22 mmol/L (ref 20–29)
Calcium: 9.5 mg/dL (ref 8.7–10.2)
Chloride: 102 mmol/L (ref 96–106)
Creatinine, Ser: 0.89 mg/dL (ref 0.76–1.27)
Globulin, Total: 2 g/dL (ref 1.5–4.5)
Glucose: 110 mg/dL — ABNORMAL HIGH (ref 70–99)
Potassium: 4.4 mmol/L (ref 3.5–5.2)
Sodium: 141 mmol/L (ref 134–144)
Total Protein: 7.2 g/dL (ref 6.0–8.5)
eGFR: 110 mL/min/{1.73_m2} (ref >59)

## 2021-05-29 LAB — URINALYSIS, ROUTINE W REFLEX MICROSCOPIC
Bilirubin, UA: NEGATIVE
Glucose, UA: NEGATIVE
Ketones, UA: NEGATIVE
Leukocytes,UA: NEGATIVE
Nitrite, UA: NEGATIVE
Protein,UA: NEGATIVE
RBC, UA: NEGATIVE
Specific Gravity, UA: 1.012 (ref 1.005–1.030)
Urobilinogen, Ur: 0.2 mg/dL (ref 0.2–1.0)
pH, UA: 7 (ref 5.0–7.5)

## 2021-05-29 LAB — CBC
Hematocrit: 46.7 % (ref 37.5–51.0)
Hemoglobin: 16.1 g/dL (ref 13.0–17.7)
MCH: 29.8 pg (ref 26.6–33.0)
MCHC: 34.5 g/dL (ref 31.5–35.7)
MCV: 86 fL (ref 79–97)
Platelets: 321 10*3/uL (ref 150–450)
RBC: 5.41 x10E6/uL (ref 4.14–5.80)
RDW: 13.5 % (ref 11.6–15.4)
WBC: 5.3 10*3/uL (ref 3.4–10.8)

## 2021-05-29 LAB — LIPID PANEL
Chol/HDL Ratio: 3.2 ratio (ref 0.0–5.0)
Cholesterol, Total: 151 mg/dL (ref 100–199)
HDL: 47 mg/dL (ref >39)
LDL Chol Calc (NIH): 77 mg/dL (ref 0–99)
Triglycerides: 159 mg/dL — ABNORMAL HIGH (ref 0–149)
VLDL Cholesterol Cal: 27 mg/dL (ref 5–40)

## 2021-06-03 NOTE — Addendum Note (Signed)
Addended by: Jac Canavan on: 06/03/2021 08:29 AM   Modules accepted: Orders

## 2021-06-18 ENCOUNTER — Other Ambulatory Visit (HOSPITAL_COMMUNITY): Payer: Self-pay | Admitting: Orthopedic Surgery

## 2021-06-18 ENCOUNTER — Ambulatory Visit (HOSPITAL_COMMUNITY)
Admission: RE | Admit: 2021-06-18 | Discharge: 2021-06-18 | Disposition: A | Discharge status: Home / Self Care | Payer: 59 | Source: Ambulatory Visit | Attending: Orthopedic Surgery | Admitting: Orthopedic Surgery

## 2021-06-18 ENCOUNTER — Other Ambulatory Visit: Payer: Self-pay

## 2021-06-18 DIAGNOSIS — M79605 Pain in left leg: Secondary | ICD-10-CM | POA: Diagnosis present

## 2021-06-21 DIAGNOSIS — E119 Type 2 diabetes mellitus without complications: Secondary | ICD-10-CM

## 2021-06-21 HISTORY — DX: Type 2 diabetes mellitus without complications: E11.9

## 2021-06-25 ENCOUNTER — Other Ambulatory Visit: Payer: Self-pay

## 2021-06-25 ENCOUNTER — Ambulatory Visit (INDEPENDENT_AMBULATORY_CARE_PROVIDER_SITE_OTHER): Payer: 59 | Admitting: Medical

## 2021-06-25 VITALS — BP 120/80 | HR 72 | Temp 99.2°F | Wt 175.0 lb

## 2021-06-25 DIAGNOSIS — I10 Essential (primary) hypertension: Secondary | ICD-10-CM | POA: Diagnosis not present

## 2021-06-25 DIAGNOSIS — Z8249 Family history of ischemic heart disease and other diseases of the circulatory system: Secondary | ICD-10-CM

## 2021-06-25 DIAGNOSIS — E785 Hyperlipidemia, unspecified: Secondary | ICD-10-CM

## 2021-06-25 DIAGNOSIS — M545 Low back pain, unspecified: Secondary | ICD-10-CM

## 2021-06-25 DIAGNOSIS — Z136 Encounter for screening for cardiovascular disorders: Secondary | ICD-10-CM

## 2021-06-25 DIAGNOSIS — Z8739 Personal history of other diseases of the musculoskeletal system and connective tissue: Secondary | ICD-10-CM

## 2021-06-25 DIAGNOSIS — G8929 Other chronic pain: Secondary | ICD-10-CM

## 2021-06-25 DIAGNOSIS — R7301 Impaired fasting glucose: Secondary | ICD-10-CM

## 2021-06-25 MED ORDER — HYDROCODONE-ACETAMINOPHEN 5-325 MG PO TABS: 1.0000 | ORAL_TABLET | Freq: Four times a day (QID) | ORAL | 0 refills | Status: DC | PRN

## 2021-06-25 MED ORDER — BACLOFEN 10 MG PO TABS: 10.0000 mg | ORAL_TABLET | Freq: Every day | ORAL | 0 refills | Status: DC | PRN

## 2021-06-25 MED ORDER — DULOXETINE HCL 30 MG PO CPEP: 30.0000 mg | ORAL_CAPSULE | Freq: Every day | ORAL | 1 refills | Status: DC

## 2021-06-25 NOTE — Progress Notes (Signed)
Subjective: Chief Complaint  Patient presents with   lower back pain    Lower back pain.  neurosurgery Dr. Yetta Barre said it was just going to take time to heal and no surgery is needed, but pt is in severe pain.    Here for recheck on back pain.  Medical team: [facility] Dr. Marikay Alar, [facility] Keghan Mcfarren, Kermit Balo, PA-C here for primary care  Concerns: He has been seeing neurosurgery recently for the same issue.  He notes that at his most recent visit they felt that he did not need surgery.  He was advised to follow-up as needed.  He is here today because he still is having a lot of pain  He continues to have low back pain that radiates across the low back.  At times its 8/10 or 10/10 pain.  It stays at least 6/10 pain all the time.  He feels like he needs some type of remedy  He also had an injury over Christmas hurting his ankle.  He is seeing orthopedics for this  He notes that he was in tears with so much pain I requested between the ankle and the back.  His pain limits his work as well.  He has not went back to work yet since his recent back issues and neurosurgery follow-up.  He drained his HSA account recently dealing with all the visits and co-pays with neurosurgery and is frustrated he still does not have a remedy.  He notes prior side effects of gabapentin causing vision changes.  He is doing physical therapy per orthopedics regarding the ankle  On a different note, he is agreeable to moving forward with CAD screening we discussed at recent physical  Past Medical History:  Diagnosis Date   HNP (herniated nucleus pulposus), cervical    C4-5   Hyperlipidemia    Hypertension    Spontaneous pneumothorax    3 times, 2009, 2007, 2005   Past Surgical History:  Procedure Laterality Date   ANTERIOR CERVICAL DECOMP/DISCECTOMY FUSION N/A 11/23/2017   Procedure: C4-5 ANTERIOR CERVICAL DECOMPRESSION/DISCECTOMY FUSION, ALLOGRAFT, PLATE;  Surgeon:  Eldred Manges, MD;  Location: [facility] OR;  Service: Orthopedics;  Laterality: N/A;   HERNIA REPAIR     Right Inguinal   LUNG SURGERY  2009, 2007, 2005   spontaneous pneumothorax x 3 surgeries   ROS as in subjective   Objective: BP 120/80    Pulse 72    Temp 99.2 F (37.3 C)    Wt 175 lb (79.4 kg)    SpO2 98%    BMI 28.25 kg/m   Gen: wd, wn, nad Back ROM limited due to pain Limited exam today    Assessment: Encounter Diagnoses  Name Primary?   Chronic bilateral low back pain, unspecified whether sciatica present Yes   Encounter for screening for coronary artery disease    Essential hypertension, benign    Impaired fasting blood sugar    Family history of premature CAD    Hyperlipidemia, unspecified hyperlipidemia type    History of spinal stenosis     Plan We discussed his pain concerns.   I reviewed recent multiple visit notes with neurosurgery from 05/2021.  Per the 06/11/21 neurosurgery notes, he had recent MRI showing mild disc desiccation L5-S1 without loss of disc space height or modic endplate changes.  Small midline annular tear with decreased size of small midline protrusion without neural compression.    At that visit neurosurgery advised no surgical intervention, and advised prn  follow up  there was mention of pain management consult  Today I encouraged either referral to integrative therapies or pain management.  He wants to think about this and let me know.   I advised that I would not be agreeable to chronic narcotic medications .  I gave him trial today of Cymbalta, refilled muscle relaxer for prn use.   Gave short supply of pain medicaiton for breakthrough pain with no plans to refill hydrocodone.     Recheck in 3-4 weeks on cymbalta.    Given family hx/o premature CAD, HTN, hyperlipemia - Referral for CT coronary calcium score.   Gerber was seen today for lower back pain.  Diagnoses and all orders for this visit:  Chronic bilateral low back pain,  unspecified whether sciatica present  Encounter for screening for coronary artery disease -     CT CARDIAC SCORING ([facility] LOCATIONS ONLY); Future  Essential hypertension, benign  Impaired fasting blood sugar  Family history of premature CAD  Hyperlipidemia, unspecified hyperlipidemia type  History of spinal stenosis  Other orders -     DULoxetine (CYMBALTA) 30 MG capsule; Take 1 capsule (30 mg total) by mouth daily. -     baclofen (LIORESAL) 10 MG tablet; Take 1 tablet (10 mg total) by mouth daily as needed for muscle spasms. -     HYDROcodone-acetaminophen (NORCO) 5-325 MG tablet; Take 1 tablet by mouth every 6 (six) hours as needed.   F/u 81mo

## 2021-06-26 ENCOUNTER — Ambulatory Visit: Payer: 59 | Admitting: Medical

## 2021-07-08 ENCOUNTER — Other Ambulatory Visit: Payer: Self-pay

## 2021-07-08 ENCOUNTER — Ambulatory Visit (INDEPENDENT_AMBULATORY_CARE_PROVIDER_SITE_OTHER): Payer: 59 | Admitting: Medical

## 2021-07-08 VITALS — BP 120/80 | HR 79 | Temp 99.0°F | Wt 179.0 lb

## 2021-07-08 DIAGNOSIS — K611 Rectal abscess: Secondary | ICD-10-CM

## 2021-07-08 DIAGNOSIS — R7301 Impaired fasting glucose: Secondary | ICD-10-CM

## 2021-07-08 MED ORDER — AMOXICILLIN-POT CLAVULANATE 875-125 MG PO TABS: 1.0000 | ORAL_TABLET | Freq: Two times a day (BID) | ORAL | 0 refills | Status: DC

## 2021-07-08 NOTE — Progress Notes (Signed)
Subjective:  Cristian Walker is a 41 y.o. male who presents for Chief Complaint  Patient presents with   rectum abcess    Rectum abscess- since last Thursday. Busted last night, pt running temp. Having chills      Here for possible abscess.  Started about 6 days ago.  First was irritated then as the week went on fell a swelling inside rectum and discomfort at rectum/anus.  Over the weekend and into Monday 2 days ago had felt a little feverish, low grade, chills, body aches, and lymph nodes swelling in neck.    Recently was on prednisone for back pain, and this made him more hungry. He found he was having multiple BMs daily.    Last night felt big knot at rectum.  Has been using preparation H.  Last night felt a rupture and noted liquid on tissue like it burst open.    No other aggravating or relieving factors.    No other c/o.  Past Medical History:  Diagnosis Date   HNP (herniated nucleus pulposus), cervical    C4-5   Hyperlipidemia    Hypertension    Spontaneous pneumothorax    3 times, 2009, 2007, 2005   Current Outpatient Medications on File Prior to Visit  Medication Sig Dispense Refill   HYDROcodone-acetaminophen (NORCO) 5-325 MG tablet Take 1 tablet by mouth every 6 (six) hours as needed. 20 tablet 0   quinapril (ACCUPRIL) 10 MG tablet Take 1 tablet (10 mg total) by mouth daily. 90 tablet 3   rosuvastatin (CRESTOR) 20 MG tablet Take 1 tablet (20 mg total) by mouth daily. 90 tablet 3   No current facility-administered medications on file prior to visit.     The following portions of the patient's history were reviewed and updated as appropriate: allergies, current medications, past family history, past medical history, past social history, past surgical history and problem list.  ROS Otherwise as in subjective above    Objective: BP 120/80    Pulse 79    Temp 99 F (37.2 C)    Wt 179 lb (81.2 kg)    BMI 28.89 kg/m   General appearance: alert, no distress, well  developed, well nourished Left side of perirectal area adjacent to the anus with a 5 cm area of induration and pink-red coloration.  There is an area that appears to have an opening where he had drainage.  No obvious fluctuance.  He is tender in indurated area No other obvious deformity    Assessment: Encounter Diagnoses  Name Primary?   Perirectal abscess Yes   Impaired fasting blood sugar      Plan: We discussed diagnosis of perirectal abscess, possible causes or triggers.  Labs today as below for further evaluation.  He has been on prednisone recently and he does have a history of elevated blood sugars without diabetes diagnosis.  Begin antibiotic below.  Advise probiotic while on the antibiotic.  Advised hot bath soaks.  Can use over-the-counter ibuprofen next several days.  If  worsening in the next few days such as worse pain, worse redness, or swelling and get reevaluated.   Cristian Walker was seen today for rectum abcess.  Diagnoses and all orders for this visit:  Perirectal abscess -     Hemoglobin A1c -     HIV Antibody (routine testing w rflx)  Impaired fasting blood sugar -     Hemoglobin A1c  Other orders -     amoxicillin-clavulanate (AUGMENTIN) 875-125 MG tablet; Take  1 tablet by mouth 2 (two) times daily.    Follow up: pending labs

## 2021-07-08 NOTE — Patient Instructions (Signed)
Dfd    Anorectal Abscess An abscess is an infected area that contains a collection of pus. An anorectal abscess is an abscess that is near the opening of the anus or around the rectum. Without treatment, an anorectal abscess can become larger and cause other problems, such as a more serious body-wide infection or pain, especially during bowel movements. What are the causes? This condition is caused by plugged glands or an infection in one of these areas: The anus. The area between the anus and the scrotum in males or between the anus and the vagina in females (perineum). What increases the risk? The following factors may make you more likely to develop this condition: Diabetes or inflammatory bowel disease. Having a body defense system (immune system) that is weak. Engaging in anal sex. Having a sexually transmitted infection (STI). Certain kinds of cancer, such as rectal carcinoma, leukemia, or lymphoma. What are the signs or symptoms? The main symptom of this condition is pain. The pain may be a throbbing pain that gets worse during bowel movements. Other symptoms include: Swelling and redness in the area of the abscess. The redness may go beyond the abscess and appear as a red streak on the skin. A visible, painful lump, or a lump that can be felt when touched. Bleeding or pus-like discharge from the area. Fever. General weakness. Constipation. Diarrhea. How is this diagnosed? This condition is diagnosed based on your medical history and a physical exam of the affected area. This may involve examining the rectal area with a gloved hand (digital rectal exam). Sometimes, the health care provider needs to look into the rectum using a probe, scope, or imaging test. For women, it may require a careful vaginal exam. How is this treated? Treatment for this condition may include: Incision and drainage surgery. This involves making an incision over the abscess to drain the pus. Medicines,  including antibiotic medicine, pain medicine, stool softeners, or laxatives. Follow these instructions at home: Medicines Take over-the-counter and prescription medicines only as told by your health care provider. If you were prescribed an antibiotic medicine, use it as told by your health care provider. Do not stop using the antibiotic even if you start to feel better. Do not drive or use heavy machinery while taking prescription pain medicine. Wound care  If gauze was used in the abscess, follow instructions from your health care provider about removing or changing the gauze. It can usually be removed in 2-3 days. Wash your hands with soap and water before you remove or change your gauze. If soap and water are not available, use hand sanitizer. If one or more drains were placed in the abscess cavity, be careful not to pull at them. Your health care provider will tell you how long they need to remain in place. Check your incision area every day for signs of infection. Check for: More redness, swelling, or pain. More fluid or blood. Warmth. Pus or a bad smell. Managing pain, stiffness, and swelling  Take a sitz bath 3-4 times a day and after bowel movements. This will help reduce pain and swelling. To relieve pain, try sitting: On a heating pad with the setting on low. On an inflatable donut-shaped cushion. If directed, put ice on the affected area: Put ice in a plastic bag. Place a towel between your skin and the bag. Leave the ice on for 20 minutes, 2-3 times a day. General instructions Follow any diet instructions given by your health care provider. Keep all  follow-up visits as told by your health care provider. This is important. Contact a health care provider if you have: Bleeding from your incision. Pain, swelling, or redness that does not improve or gets worse. Trouble passing stool or urine. Symptoms that return after treatment. Get help right away if you: Have problems  moving or using your legs. Have severe or increasing pain. Have swelling in the affected area that suddenly gets worse. Have a large increase in bleeding or passing of pus. Develop chills or a fever. Summary An anorectal abscess is an abscess that is near the opening of the anus or around the rectum. An abscess is an infected area that contains a collection of pus. The main symptom of this condition is pain. It may be a throbbing pain that gets worse during bowel movements. Treatment for an anorectal abscess may include surgery to drain the pus from the abscess. Medicines and sitz baths may also be a part of your treatment plan. This information is not intended to replace advice given to you by your health care provider. Make sure you discuss any questions you have with your health care provider. Document Revised: 12/24/2020 Document Reviewed: 12/24/2020 Elsevier Patient Education  [employer].

## 2021-07-09 LAB — HEMOGLOBIN A1C
Est. average glucose Bld gHb Est-mCnc: 151 mg/dL
Hgb A1c MFr Bld: 6.9 % — ABNORMAL HIGH (ref 4.8–5.6)

## 2021-07-09 LAB — HIV ANTIBODY (ROUTINE TESTING W REFLEX): HIV Screen 4th Generation wRfx: NONREACTIVE

## 2021-07-10 ENCOUNTER — Other Ambulatory Visit: Payer: Self-pay | Admitting: Medical

## 2021-07-10 LAB — SPECIMEN STATUS REPORT

## 2021-07-10 LAB — GLUCOSE, RANDOM: Glucose: 194 mg/dL — ABNORMAL HIGH (ref 70–99)

## 2021-07-10 MED ORDER — METFORMIN HCL 500 MG PO TABS: 500.0000 mg | ORAL_TABLET | Freq: Two times a day (BID) | ORAL | 2 refills | Status: DC

## 2021-07-13 ENCOUNTER — Other Ambulatory Visit: Payer: Self-pay | Admitting: Medical

## 2021-07-13 MED ORDER — HYDROCODONE-ACETAMINOPHEN 5-325 MG PO TABS: 1.0000 | ORAL_TABLET | Freq: Four times a day (QID) | ORAL | 0 refills | Status: DC | PRN

## 2021-07-16 ENCOUNTER — Ambulatory Visit
Admission: RE | Admit: 2021-07-16 | Discharge: 2021-07-16 | Disposition: A | Discharge status: Home / Self Care | Payer: No Typology Code available for payment source | Source: Ambulatory Visit | Attending: Medical | Admitting: Medical

## 2021-07-16 DIAGNOSIS — Z136 Encounter for screening for cardiovascular disorders: Secondary | ICD-10-CM

## 2021-07-31 MED ORDER — BLOOD GLUCOSE METER KIT: PACK | 0 refills | Status: AC

## 2021-08-03 ENCOUNTER — Other Ambulatory Visit: Payer: Self-pay | Admitting: Medical

## 2021-08-03 MED ORDER — GABAPENTIN 100 MG PO CAPS: 200.0000 mg | ORAL_CAPSULE | Freq: Every day | ORAL | 0 refills | Status: DC

## 2021-08-07 ENCOUNTER — Other Ambulatory Visit: Payer: Self-pay | Admitting: Medical

## 2021-08-07 MED ORDER — HYDROCODONE-ACETAMINOPHEN 5-325 MG PO TABS: 1.0000 | ORAL_TABLET | Freq: Two times a day (BID) | ORAL | 0 refills | Status: DC | PRN

## 2021-08-07 MED ORDER — PREDNISONE 10 MG PO TABS: ORAL_TABLET | ORAL | 0 refills | Status: DC

## 2021-08-12 ENCOUNTER — Ambulatory Visit (INDEPENDENT_AMBULATORY_CARE_PROVIDER_SITE_OTHER): Payer: 59 | Admitting: Medical

## 2021-08-12 ENCOUNTER — Other Ambulatory Visit: Payer: Self-pay

## 2021-08-12 VITALS — BP 120/70 | HR 101 | Wt 181.0 lb

## 2021-08-12 DIAGNOSIS — G8929 Other chronic pain: Secondary | ICD-10-CM

## 2021-08-12 DIAGNOSIS — Z8709 Personal history of other diseases of the respiratory system: Secondary | ICD-10-CM | POA: Diagnosis not present

## 2021-08-12 DIAGNOSIS — Z79899 Other long term (current) drug therapy: Secondary | ICD-10-CM | POA: Diagnosis not present

## 2021-08-12 DIAGNOSIS — M545 Low back pain, unspecified: Secondary | ICD-10-CM

## 2021-08-12 DIAGNOSIS — Z8739 Personal history of other diseases of the musculoskeletal system and connective tissue: Secondary | ICD-10-CM | POA: Diagnosis not present

## 2021-08-12 DIAGNOSIS — E118 Type 2 diabetes mellitus with unspecified complications: Secondary | ICD-10-CM | POA: Diagnosis not present

## 2021-08-12 MED ORDER — HYDROCODONE-ACETAMINOPHEN 10-325 MG PO TABS: 0.5000 | ORAL_TABLET | Freq: Four times a day (QID) | ORAL | 0 refills | Status: DC | PRN

## 2021-08-12 NOTE — Patient Instructions (Signed)
Type 2 Diabetes Diabetes is a long-lasting (chronic) disease.  With diabetes, either the pancreas does not make enough of a hormone called insulin, or the body has trouble using the insulin that is made. Over time, diabetes can damage the eyes, kidneys, and nerves causing retinopathy, nephropathy, and neuropathy.  Diabetes puts you at risk for heart disease and peripheral vascular disease which can lead to heart attack, stroke, foot ulcers, and amputations.   Our goal and hopefully your goal is to manage your diabetes in such a way to slow the progression of the disease and do all we can to keep you healthy  Home Care:  Eat healthy, exercise regularly, limit alcohol, and don't smoke! Check your blood sugar (glucose) once a day before breakfast, or as indicated by our discussion today.  The goal is to keep your morning fasting sugar less than 130.  In general you should try to keep your blood sugars between 80 - 130 fasting or before meals. We also want to avoid hypoglycemia or low blood sugars less than 70.  Make sure you understand how to treat hypoglycemic episodes.  Ask me about this if we had not discussed this. If you are on medication, take your medications daily, don't run out of medications. Learn about low blood sugar (hypoglycemia). Know how to treat it. Wear a necklace or bracelet that says you have diabetes in the event of emergency for first responders to identify you have diabetes. Check your feet every night for cuts, sores, blisters, and redness. Tell your medical provider if you have problems. Maintain a normal body weight, or normal BMI - height to weight ratio of 20-25.  Ask me about this.  GET HELP RIGHT AWAY IF: You have trouble keeping your blood sugar in target range. You have problems with your medicines. You are sick and not getting better after 24 hours. You have a sore or wound that is not healing. You have vision problems or changes. You have a fever.   Exercise  regularly since it has beneficial effects on the heart and blood sugars. Exercising at least three times per week or 150 minutes per week can be as important as medication to a diabetic.  Find some form of exercise that you will enjoy doing regularly.  This can include walking, biking, kayaking, golfing, swimming, dance, aerobics, hiking, etc.  If you have joint problems, many local gyms have equipment to accommodate people with specific needs.    Vaccinations:  Diabetics are at increased risk for infection, and illnesses can take longer to resolve.  Current vaccine recommendations include yearly Influenza (flu) vaccine (recommended in October), Pneumococcal vaccine, Hepatitis B vaccine series, Tdap (tetanus, diptheria and pertussis) vaccine every 10 years, Covid vaccination, and other age appropriate vaccinations.     Office visits:  We recommend routine medical care to make sure we are addressing prevention and issues as they arise.  Typically this could mean twice yearly or up to quarterly depending upon your unique health situation.  Exams should include a yearly physical, a yearly foot exam, and other examination as appropriate.  You should see an eye doctor yearly to help screen for and prevent blood vessel complications in your eyes.  Labs: Diabetics should have blood work done at least twice yearly to monitor your Hemoglobin A1C (a three-month average of your blood sugars) and your cholesterol.  You should have your urine and blood checked yearly to screen for kidney damage.  This may include creatinine and micro-albumin  levels.  Other labs as appropriate.    Blood pressure goals:  Goal blood pressure in diabetics should be 130/80 or less. Monitoring your blood pressure with a home blood pressure cuff of your own is an excellent idea.  If you are prescribed medication for blood pressure, take your medication every day, and don't run out of medication.  Having high blood pressure can damage your  heart, eyes, kidneys, and put you at risk for heart attack and stroke.  Tobacco use:  If you smoke, dip or chew, quitting will reduce your risk of heart attack, stroke, peripheral vascular disease, and many cancers.  Other Specialists:   Eye doctor: See your eye doctor yearly for routine eye exam and diabetic eye exam.  Make sure your eye doctor sends [place] a copy of the report  Dentist: See your dentist twice yearly for dental hygiene visits and routine dental care.  Brush and floss your teeth every day.  Dental hygiene is and important part of diabetic care.  Diabetics sometimes see other specialist depending on the complexity of your health issues.    Diabetic Report Card for Cristian Walker  August 12, 2021  Below is a summary of recent tests related to your diabetes that can help you manage your health.   Hemoglobin A1C:  Your Hemoglobin A1C values should be less than 7. If these are greater than 7, you have a higher chance of having eye, heart, and kidney problems in the future.   Your most recent Hemoglobin A1C values were:  Hgb A1c MFr Bld (%)  Date Value  07/08/2021 6.9 (H)  11/15/2018 5.5  03/04/2016 5.2     Cholesterol:  Your LDL Cholesterol (bad cholesterol) values should be less than 100 mg/dL if you do not have cardiovascular disease.  The LDL should be less than 70 mg/dL if you do have cardiovascular disease.  If your LDL is consistently higher than 100 mg/dL, then your risk of heart attack and stroke increases yearly.   Your most recent LDL Cholesterol (bad cholesterol) results were:  LDL Chol Calc (NIH) (mg/dL)  Date Value  41/63/8453 77  03/10/2020 71   LDL Calculated (mg/dL)  Date Value  64/68/0321 112 (H)  10/25/2017 123 (H)   LDL Cholesterol (mg/dL)  Date Value  22/48/2500 146 (H)     Your HDL Cholesterol (good cholesterol) values should be higher than 40 mg/dL.  If your HDL is lower than 40 mg/dL, this increases your risk of heart attack and  stroke.    Blood Pressure:  Your blood pressure values should be less than 130/80. Please contact me if your readings at home are consistently higher than this.   Your most recent blood pressure readings at our clinic were:  BP Readings from Last 3 Encounters:  08/12/21 120/70  07/08/21 120/80  06/25/21 120/80    Urine Protein: Having an elevated microalbumin to creatinine ratio is a marker for early kidney damage due to diabetes or high blood pressure.

## 2021-08-12 NOTE — Progress Notes (Signed)
Subjective:  Cristian Walker is a 41 y.o. male who presents for Chief Complaint  Patient presents with   1 month follow-up on diabetes    Diabetes. Been on prednisone for the last 3 days and BS are high.      Here for diabetes f/u.  New onset as of 06/2021 along with new onset perirectal abscess.  Since recent visit in 06/2021 he bought a glucometer.  He has been checking glucose.  Checking glucose before dinner in the evenings.  Been seeing 84-low 100s initially but started prednisone recently for sciatica flare.   Sugars running higher this week.  Yesterday Tuesday, glucose fasting was 173, Monday 167.  Sunday 144, Saturday 192.  However, been on prednisone this past week for back issues.   Since 06/2021, quit drinking soda, been eating salad for lunch.   Cut out other sugars recently.  However, he does love potatoes.    He is up to Gabapentin 300mg  BID for chronic back pain.    He went to refill norco recently but there is a [facility] surgeon of 5/325mg  so he wasn't able to pick this up.  No other aggravating or relieving factors.    No other c/o.  The following portions of the patient's history were reviewed and updated as appropriate: allergies, current medications, past family history, past medical history, past social history, past surgical history and problem list.  ROS Otherwise as in subjective above    Objective: BP 120/70    Pulse (!) 101    Wt 181 lb (82.1 kg)    BMI 29.21 kg/m   Wt Readings from Last 3 Encounters:  08/12/21 181 lb (82.1 kg)  07/08/21 179 lb (81.2 kg)  06/25/21 175 lb (79.4 kg)    General appearance: alert, no distress, well developed, well nourished     Assessment: Encounter Diagnoses  Name Primary?   Type II diabetes mellitus with complication (HCC) Yes   History of spinal stenosis    History of pneumothorax    High risk medication use    Chronic bilateral low back pain, unspecified whether sciatica present      Plan: Discussed new  diagnosis of diabetes, criteria to make the diagnosis, possible complications of diabetes, and the opportunity to make lifestyle changes to get this under control.  Discussed short term goals of diabetes care.    Discussed follow up, typically every 3 months, lab monitoring, importance of HgbA1C.   Discussed diet in great detail, importance of exercise.  Discussed vaccinations, discussed general preventative measures including eye exams yearly with eye doctor, dental care, routine f/u here.   Discussed medications. Continue Metformin began in January 2023.   discussed risks/benefits of medications  Discussed glucose monitoring.     Counseled on meal plans, diet.  He is on last day or prednisone tomorrow, but if any sugars over 200 cal back as we may add short term insulin.  Hopefully we will just remain on metformin 500mg  BID for now.    Pain - he just started Gabapentin 300mg  BID up from 100mg  BID.  C/t this for now.  Refilled short term norco and advised he f/u with spine specialist or we can refer to pain clinic.  He will let me know about referral, but advised I can't continue prescribing narcotics.     Cristian Walker was seen today for 1 month follow-up on diabetes.  Diagnoses and all orders for this visit:  Type II diabetes mellitus with complication ([place])  History of  spinal stenosis  History of pneumothorax  High risk medication use  Chronic bilateral low back pain, unspecified whether sciatica present  Other orders -     HYDROcodone-acetaminophen (NORCO) 10-325 MG tablet; Take 0.5 tablets by mouth every 6 (six) hours as needed.    Follow up: 2mo

## 2021-09-28 LAB — HM DIABETES EYE EXAM

## 2021-10-04 ENCOUNTER — Other Ambulatory Visit: Payer: Self-pay | Admitting: Medical

## 2021-10-09 ENCOUNTER — Encounter: Payer: Self-pay | Admitting: Internal Medicine

## 2021-10-13 ENCOUNTER — Encounter: Payer: Self-pay | Admitting: Internal Medicine

## 2021-10-13 ENCOUNTER — Encounter: Payer: Self-pay | Admitting: Medical

## 2021-10-19 ENCOUNTER — Ambulatory Visit (INDEPENDENT_AMBULATORY_CARE_PROVIDER_SITE_OTHER): Payer: 59 | Admitting: Medical

## 2021-10-19 VITALS — BP 110/62 | HR 77 | Wt 184.6 lb

## 2021-10-19 DIAGNOSIS — Z8249 Family history of ischemic heart disease and other diseases of the circulatory system: Secondary | ICD-10-CM

## 2021-10-19 DIAGNOSIS — E785 Hyperlipidemia, unspecified: Secondary | ICD-10-CM

## 2021-10-19 DIAGNOSIS — I10 Essential (primary) hypertension: Secondary | ICD-10-CM

## 2021-10-19 DIAGNOSIS — R200 Anesthesia of skin: Secondary | ICD-10-CM

## 2021-10-19 DIAGNOSIS — E118 Type 2 diabetes mellitus with unspecified complications: Secondary | ICD-10-CM

## 2021-10-19 NOTE — Assessment & Plan Note (Signed)
Reviewed lipid labs 05/2021.   Continue statin  ?

## 2021-10-19 NOTE — Assessment & Plan Note (Addendum)
Labs today.  Continue metformin twice daily, continue glucose testing, continue with healthy low sugar diet, exercise ?

## 2021-10-19 NOTE — Assessment & Plan Note (Addendum)
Continue current medication, at goal ?

## 2021-10-19 NOTE — Progress Notes (Signed)
Subjective: ? Cristian Walker is a 41 y.o. male who presents for ?Chief Complaint  ?Patient presents with  ? diabetes  ?  Diabetes, arms are going to sleep and some trouble grabbing, tingling and numbness  ?   ?Hyperlipidemia ?Pertinent negatives include no chest pain or shortness of breath. Current antihyperlipidemic treatment includes statins. There are no compliance problems.   ?Diabetes ?He presents for his follow-up diabetic visit. He has type 2 diabetes mellitus. There are no hypoglycemic associated symptoms. Pertinent negatives for diabetes include no chest pain. There are no hypoglycemic complications. Compliance with diabetes treatment: glucose at home at goal.   eating carefully excpet had some pizza yesterday.  ?Hypertension ?The problem is controlled (glucose at home at goal.   eating carefully excpet had some pizza yesterday). Pertinent negatives include no chest pain, peripheral edema or shortness of breath. Past treatments include ACE inhibitors. There are no compliance problems.   ? ?He is having problems with arms staying numb, worse on left hand of late.   At night stays numb, but hands tingle all day long.   Using gabapentin 2- 3 times daily for chronic back pain.  2019 had C4-5 fusion, hx/o neck surgery.   ?Has hx/o right ulnar nerve surgery.  Both arms giving problems.  Sees Dr. French Ana, [facility]. ? ?No other aggravating or relieving factors.   ? ?No other c/o. ? ?Past Medical History:  ?Diagnosis Date  ? HNP (herniated nucleus pulposus), cervical   ? C4-5  ? Hyperlipidemia   ? Hypertension   ? Spontaneous pneumothorax   ? 3 times, 2009, 2007, 2005  ? ?Current Outpatient Medications on File Prior to Visit  ?Medication Sig Dispense Refill  ? gabapentin (NEURONTIN) 300 MG capsule Take 300 mg by mouth 3 (three) times daily.    ? metFORMIN (GLUCOPHAGE) 500 MG tablet TAKE 1 TABLET BY MOUTH 2 TIMES DAILY WITH A MEAL. 60 tablet 0  ? quinapril (ACCUPRIL) 10 MG tablet Take 1 tablet (10 mg total) by  mouth daily. 90 tablet 3  ? rosuvastatin (CRESTOR) 20 MG tablet Take 1 tablet (20 mg total) by mouth daily. 90 tablet 3  ? blood glucose meter kit and supplies Dispense based on patient and insurance preference. Test once a daily 1 each 0  ? ?No current facility-administered medications on file prior to visit.  ? ? ? ?The following portions of the patient's history were reviewed and updated as appropriate: allergies, current medications, past family history, past medical history, past social history, past surgical history and problem list. ? ?ROS ?Otherwise as in subjective above ? ?Objective: ?BP 110/62   Pulse 77   Wt 184 lb 9.6 oz (83.7 kg)   BMI 29.80 kg/m?  ? ?General appearance: alert, no distress, well developed, well nourished ?Neck: supple, no lymphadenopathy, no thyromegaly, no masses, nontender ?Heart: RRR, normal S1, S2, no murmurs ?Lungs: CTA bilaterally, no wheezes, rhonchi, or rales ?Positive Tinel's bilaterally at both wrist, grip strength decreased in both hands, otherwise sensation normal, range of motion normal, no obvious deformity or swelling of arms or hands ?Pulses: 2+ radial pulses, 2+ pedal pulses, normal cap refill ?Ext: no edema ? ?Diabetic Foot Exam - Simple   ?Simple Foot Form ?Diabetic Foot exam was performed with the following findings: Yes 10/19/2021  9:47 AM  ?Visual Inspection ?No deformities, no ulcerations, no other skin breakdown bilaterally: Yes ?Sensation Testing ?Intact to touch and monofilament testing bilaterally: Yes ?Pulse Check ?Posterior Tibialis and Dorsalis pulse intact bilaterally:  Yes ?Comments ?  ? ? ? ?Assessment: ?Encounter Diagnoses  ?Name Primary?  ? Type II diabetes mellitus with complication ([place]) Yes  ? Hyperlipidemia, unspecified hyperlipidemia type   ? Essential hypertension, benign   ? Family history of premature CAD   ? Hand numbness   ? ? ? ?Plan: ?Problem List Items Addressed This Visit   ? ? Family history of premature CAD  ? Hyperlipidemia  ?   Reviewed lipid labs 05/2021.   Continue statin  ? ?  ?  ? Essential hypertension, benign  ?  Continue current medication, at goal ? ?  ?  ? Type II diabetes mellitus with complication (HCC) - Primary  ?  Labs today.  Continue metformin twice daily, continue glucose testing, continue with healthy low sugar diet, exercise ? ?  ?  ? Relevant Orders  ? TSH  ? Vitamin B12  ? Hemoglobin A1c  ? ?Other Visit Diagnoses   ? ? Hand numbness      ? 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 c/t gabapentin and advised he do follow up wiht orthopedics ?  ? Relevant Orders  ? TSH  ? Vitamin B12  ? ?  ? ? ?Follow up: pending labs ?

## 2021-10-20 ENCOUNTER — Other Ambulatory Visit: Payer: Self-pay | Admitting: Medical

## 2021-10-20 LAB — TSH: TSH: 1.58 u[IU]/mL (ref 0.450–4.500)

## 2021-10-20 LAB — HEMOGLOBIN A1C
Est. average glucose Bld gHb Est-mCnc: 123 mg/dL
Hgb A1c MFr Bld: 5.9 % — ABNORMAL HIGH (ref 4.8–5.6)

## 2021-10-20 LAB — VITAMIN B12: Vitamin B-12: 276 pg/mL (ref 232–1245)

## 2021-10-20 MED ORDER — METFORMIN HCL 500 MG PO TABS
500.0000 mg | ORAL_TABLET | Freq: Two times a day (BID) | ORAL | 3 refills | Status: DC
Start: 2021-10-20 — End: 2022-10-25

## 2021-10-27 ENCOUNTER — Other Ambulatory Visit: Payer: Self-pay | Admitting: Medical

## 2021-11-17 ENCOUNTER — Other Ambulatory Visit: Payer: Self-pay | Admitting: Medical

## 2021-11-17 MED ORDER — LISINOPRIL 10 MG PO TABS: 10.0000 mg | ORAL_TABLET | Freq: Every day | ORAL | 3 refills | Status: DC

## 2022-01-22 ENCOUNTER — Ambulatory Visit (INDEPENDENT_AMBULATORY_CARE_PROVIDER_SITE_OTHER): Payer: 59 | Admitting: Medical

## 2022-01-22 ENCOUNTER — Other Ambulatory Visit: Payer: Self-pay | Admitting: Medical

## 2022-01-22 VITALS — BP 130/80 | HR 65 | Temp 98.5°F | Wt 183.0 lb

## 2022-01-22 DIAGNOSIS — M62830 Muscle spasm of back: Secondary | ICD-10-CM

## 2022-01-22 DIAGNOSIS — M5442 Lumbago with sciatica, left side: Secondary | ICD-10-CM

## 2022-01-22 DIAGNOSIS — S39012A Strain of muscle, fascia and tendon of lower back, initial encounter: Secondary | ICD-10-CM

## 2022-01-22 MED ORDER — PREDNISONE 10 MG PO TABS: ORAL_TABLET | ORAL | 0 refills | Status: DC

## 2022-01-22 MED ORDER — HYDROCODONE-ACETAMINOPHEN 10-325 MG PO TABS: 0.5000 | ORAL_TABLET | Freq: Two times a day (BID) | ORAL | 0 refills | Status: DC | PRN

## 2022-01-22 MED ORDER — TRAMADOL HCL 50 MG PO TABS: 50.0000 mg | ORAL_TABLET | Freq: Four times a day (QID) | ORAL | 0 refills | Status: DC | PRN

## 2022-01-22 MED ORDER — TIZANIDINE HCL 4 MG PO TABS: 4.0000 mg | ORAL_TABLET | Freq: Two times a day (BID) | ORAL | 0 refills | Status: DC | PRN

## 2022-01-22 MED ORDER — HYDROCODONE-ACETAMINOPHEN 5-325 MG PO TABS: 1.0000 | ORAL_TABLET | Freq: Four times a day (QID) | ORAL | 0 refills | Status: DC | PRN

## 2022-01-22 NOTE — Progress Notes (Signed)
Subjective:  Cristian Walker is a 41 y.o. male who presents for Chief Complaint  Patient presents with   back pain    Back pain since Wednesday. Being working on a playground for her daughter and having a flare up. Pain going down into left buttocks.      Here for acute on chronic back pain.  The past few months has been doing pretty good without a lot of back pain.  Been using gabapentin TID, but flared up back this week.  This past week was working to put down sand and work on building daughters new play set/playground.  He was carrying 50 pound bags of sand and tapping the sand that was a tamper.  Starting having some worse pain in last few days.  Awoke twice last night with back pain.  Took 2 aleve and 2 tylenol last night and some aleve this morning.  No other aggravating or relieving factors.    No other c/o.  Past Medical History:  Diagnosis Date   HNP (herniated nucleus pulposus), cervical    C4-5   Hyperlipidemia    Hypertension    Spontaneous pneumothorax    3 times, 2009, 2007, 2005   Current Outpatient Medications on File Prior to Visit  Medication Sig Dispense Refill   gabapentin (NEURONTIN) 300 MG capsule Take 300 mg by mouth 3 (three) times daily.     lisinopril (ZESTRIL) 10 MG tablet Take 1 tablet (10 mg total) by mouth daily. 90 tablet 3   metFORMIN (GLUCOPHAGE) 500 MG tablet Take 1 tablet (500 mg total) by mouth 2 (two) times daily with a meal. 180 tablet 3   rosuvastatin (CRESTOR) 20 MG tablet Take 1 tablet (20 mg total) by mouth daily. 90 tablet 3   ACCU-CHEK GUIDE test strip TEST ONCE A DAY 100 strip 2   blood glucose meter kit and supplies Dispense based on patient and insurance preference. Test once a daily 1 each 0   No current facility-administered medications on file prior to visit.   Past Surgical History:  Procedure Laterality Date   ANTERIOR CERVICAL DECOMP/DISCECTOMY FUSION N/A 11/23/2017   Procedure: C4-5 ANTERIOR CERVICAL DECOMPRESSION/DISCECTOMY  FUSION, ALLOGRAFT, PLATE;  Surgeon: Marybelle Killings, MD;  Location: [facility];  Service: Orthopedics;  Laterality: N/A;   HERNIA REPAIR     Right Inguinal   LUNG SURGERY  2009, 2007, 2005   spontaneous pneumothorax x 3 surgeries     The following portions of the patient's history were reviewed and updated as appropriate: allergies, current medications, past family history, past medical history, past social history, past surgical history and problem list.  ROS Otherwise as in subjective above  Objective: BP 130/80   Pulse 65   Temp 98.5 F (36.9 C)   Wt 183 lb (83 kg)   BMI 29.54 kg/m   General appearance: alert, no distress, well developed, well nourished Tender in the left low back paraspinal and midline, tender left buttock in the sciatic region, otherwise back nontender.  He does seem to have some pain with range of motion which is relatively full.  He does seem to aggravate the pain with heel and toe walk but he is able to do heel and toe walk.  Otherwise legs normal strength and sensation Pulses: 2+ radial pulses, 2+ pedal pulses, normal cap refill Ext: no edema   Assessment: Encounter Diagnoses  Name Primary?   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  I reviewed his November 2022 neurosurgery notes.  No history of lumbar spine surgery but has had C-spine surgery.  I believe he flared up his back with the heavy lifting and tapping of the sand this week.  He will begin the muscle laxer, he can use Norco pain medicine as needed, do some gentle stretching and do some pool therapy over the weekend.  Symptoms should gradually resolve over the next several days.  However if symptoms do not seem to be improved at all within the next 48 hours he can begin prednisone steroid Dosepak.  Once back to normal, we discussed  specific stretching exercises to keep the back strong.  We discussed doing a mild to moderate weight resistance to do rows, back extensions, back stretches.  Cristian Walker was seen today for back pain.  Diagnoses and all orders for this visit:  Left-sided low back pain with left-sided sciatica, unspecified chronicity  Spasm of muscle of lower back  Back strain, initial encounter  Other orders -     tiZANidine (ZANAFLEX) 4 MG tablet; Take 1 tablet (4 mg total) by mouth 2 (two) times daily as needed for muscle spasms. -     predniSONE (DELTASONE) 10 MG tablet; 6 tablets all together day 1, 5 tablets day 2, 4 tablets day 3, 3 tablets day 4, 2 tablets day 5, 1 tablet day 6. -     HYDROcodone-acetaminophen (NORCO) 5-325 MG tablet; Take 1 tablet by mouth every 6 (six) hours as needed.    Follow up: As needed

## 2022-01-26 ENCOUNTER — Telehealth: Payer: Self-pay | Admitting: Medical

## 2022-01-27 NOTE — Telephone Encounter (Signed)
P.A. HYDROCODONE/ACET 10/325 & 5/325 approved, cancelled the 10/325 prescription, since both was sent in.  Called pharmacy went thru for less than $2, called pt and informed

## 2022-01-29 ENCOUNTER — Other Ambulatory Visit: Payer: Self-pay | Admitting: Medical

## 2022-02-02 ENCOUNTER — Other Ambulatory Visit: Payer: Self-pay | Admitting: Medical

## 2022-02-02 MED ORDER — TRAMADOL HCL 50 MG PO TABS: 50.0000 mg | ORAL_TABLET | Freq: Four times a day (QID) | ORAL | 0 refills | Status: DC | PRN

## 2022-02-24 ENCOUNTER — Encounter: Payer: Self-pay | Admitting: Internal Medicine

## 2022-03-30 ENCOUNTER — Encounter: Payer: Self-pay | Admitting: Internal Medicine

## 2022-04-01 ENCOUNTER — Other Ambulatory Visit: Payer: Self-pay | Admitting: Medical

## 2022-05-17 ENCOUNTER — Other Ambulatory Visit: Payer: Self-pay | Admitting: Medical

## 2022-05-17 NOTE — Telephone Encounter (Signed)
Has upcoming appt °

## 2022-06-01 ENCOUNTER — Encounter: Payer: Self-pay | Admitting: Medical

## 2022-06-01 ENCOUNTER — Ambulatory Visit (INDEPENDENT_AMBULATORY_CARE_PROVIDER_SITE_OTHER): Payer: 59 | Admitting: Medical

## 2022-06-01 VITALS — BP 130/86 | HR 72 | Ht 67.0 in | Wt 176.0 lb

## 2022-06-01 DIAGNOSIS — I10 Essential (primary) hypertension: Secondary | ICD-10-CM

## 2022-06-01 DIAGNOSIS — E785 Hyperlipidemia, unspecified: Secondary | ICD-10-CM

## 2022-06-01 DIAGNOSIS — Z23 Encounter for immunization: Secondary | ICD-10-CM | POA: Diagnosis not present

## 2022-06-01 DIAGNOSIS — Z7185 Encounter for immunization safety counseling: Secondary | ICD-10-CM

## 2022-06-01 DIAGNOSIS — R202 Paresthesia of skin: Secondary | ICD-10-CM

## 2022-06-01 DIAGNOSIS — Z0000 Encounter for general adult medical examination without abnormal findings: Secondary | ICD-10-CM

## 2022-06-01 DIAGNOSIS — R143 Flatulence: Secondary | ICD-10-CM

## 2022-06-01 DIAGNOSIS — E118 Type 2 diabetes mellitus with unspecified complications: Secondary | ICD-10-CM | POA: Diagnosis not present

## 2022-06-01 DIAGNOSIS — R200 Anesthesia of skin: Secondary | ICD-10-CM | POA: Diagnosis not present

## 2022-06-01 DIAGNOSIS — R195 Other fecal abnormalities: Secondary | ICD-10-CM

## 2022-06-01 LAB — POCT URINALYSIS DIP (PROADVANTAGE DEVICE)
Bilirubin, UA: NEGATIVE
Blood, UA: NEGATIVE
Glucose, UA: NEGATIVE mg/dL
Ketones, POC UA: NEGATIVE mg/dL
Leukocytes, UA: NEGATIVE
Nitrite, UA: NEGATIVE
Protein Ur, POC: NEGATIVE mg/dL
Specific Gravity, Urine: 1.01
Urobilinogen, Ur: 0.2
pH, UA: 7.5 (ref 5.0–8.0)

## 2022-06-01 NOTE — Progress Notes (Signed)
Subjective:   HPI  Cristian Walker is a 41 y.o. male who presents for Chief Complaint  Patient presents with   Annual Exam    Fasting annual exam. Patient sees eye doctor annually. Has been having very bad gas that smells very bad over the last few weeks. Has also been having diarrhea-wonders if it could be the metformin.     Patient Care Team: Khoen Genet, Leward Quan as PCP - General (Family Medicine) Sees dentist Sees eye doctor  Concerns: Lately he is having some bad gas and some loose stool.  Diabetes-compliant with metformin 5 mg twice daily, Sugars lately at goal < 130 fasting.  No foot concerns.  No other concerns  Hypertension-compliant lisinopril 10 mg daily.  Not checking blood pressures at home  Hyperlipidemia-compliant with Crestor 20 mg daily without complaint  Chronic back pain-lately has been improving.  No specific issues today  He does note some recent numbness in arms when he is sleeping and does drop things.  He has had cubital tunnel procedure on the right before.  He does see [facility], and thought about going back to see them.   Reviewed their medical, surgical, family, social, medication, and allergy history and updated chart as appropriate.  Past Medical History:  Diagnosis Date   HNP (herniated nucleus pulposus), cervical    C4-5   Hyperlipidemia    Hypertension    Spontaneous pneumothorax    3 times, 2009, 2007, 2005    Past Surgical History:  Procedure Laterality Date   ANTERIOR CERVICAL DECOMP/DISCECTOMY FUSION N/A 11/23/2017   Procedure: C4-5 ANTERIOR CERVICAL DECOMPRESSION/DISCECTOMY FUSION, ALLOGRAFT, PLATE;  Surgeon: Marybelle Killings, MD;  Location: [facility];  Service: Orthopedics;  Laterality: N/A;   HERNIA REPAIR     Right Inguinal   LUNG SURGERY  2009, 2007, 2005   spontaneous pneumothorax x 3 surgeries    Family History  Problem Relation Age of Onset   Hypertension Mother    Heart disease Father 65       stent   Alcohol  abuse Father    Heart disease Paternal Uncle        CABG   Cancer Maternal Grandfather        lung, asbestos     Current Outpatient Medications:    ACCU-CHEK GUIDE test strip, TEST ONCE A DAY, Disp: 100 strip, Rfl: 2   acetaminophen (TYLENOL) 500 MG tablet, Take 1,000 mg by mouth every 6 (six) hours as needed., Disp: , Rfl:    blood glucose meter kit and supplies, Dispense based on patient and insurance preference. Test once a daily, Disp: 1 each, Rfl: 0   gabapentin (NEURONTIN) 300 MG capsule, Take 300 mg by mouth 3 (three) times daily., Disp: , Rfl:    lisinopril (ZESTRIL) 10 MG tablet, Take 1 tablet (10 mg total) by mouth daily., Disp: 90 tablet, Rfl: 3   metFORMIN (GLUCOPHAGE) 500 MG tablet, Take 1 tablet (500 mg total) by mouth 2 (two) times daily with a meal., Disp: 180 tablet, Rfl: 3   rosuvastatin (CRESTOR) 20 MG tablet, TAKE 1 TABLET BY MOUTH EVERY DAY, Disp: 30 tablet, Rfl: 0  No Known Allergies   Review of Systems  Constitutional:  Negative for chills, fever, malaise/fatigue and weight loss.  HENT:  Negative for congestion, ear pain, hearing loss, sore throat and tinnitus.   Eyes:  Negative for blurred vision, pain and redness.  Respiratory:  Negative for cough, hemoptysis and shortness of breath.  Cardiovascular:  Negative for chest pain, palpitations, orthopnea, claudication and leg swelling.  Gastrointestinal:  Positive for diarrhea. Negative for abdominal pain, blood in stool, constipation, nausea and vomiting.  Genitourinary:  Negative for dysuria, flank pain, frequency, hematuria and urgency.  Musculoskeletal:  Positive for back pain. Negative for falls, joint pain and myalgias.  Skin:  Negative for itching and rash.  Neurological:  Positive for tingling and weakness. Negative for dizziness, speech change and headaches.  Endo/Heme/Allergies:  Negative for polydipsia. Does not bruise/bleed easily.  Psychiatric/Behavioral:  Negative for depression and memory loss. The  patient is not nervous/anxious and does not have insomnia.        06/01/2022    8:38 AM 10/19/2021    9:25 AM 08/12/2021    3:32 PM 05/28/2021    1:55 PM 03/10/2020   11:43 AM  Depression screen PHQ 2/9  Decreased Interest 0 0 0 0 0  Down, Depressed, Hopeless 0 0 0 0 0  PHQ - 2 Score 0 0 0 0 0       Objective:  BP 130/86   Pulse 72   Ht _0  (1.702 m)   Wt 176 lb (79.8 kg)   BMI 27.57 kg/m   BP Readings from Last 3 Encounters:  06/01/22 130/86  01/22/22 130/80  10/19/21 110/62   Wt Readings from Last 3 Encounters:  06/01/22 176 lb (79.8 kg)  01/22/22 183 lb (83 kg)  10/19/21 184 lb 9.6 oz (83.7 kg)   General appearance: alert, no distress, WD/WN, Caucasian male Skin: left temple with 67m diameter slightly raised salmon colored lesion, scattered macules otherwise of torso HEENT: normocephalic, conjunctiva/corneas normal, sclerae anicteric, PERRLA, EOMi, nares patent, no discharge or erythema, pharynx normal Oral cavity: MMM, tongue normal, teeth in good repair Neck: supple, no lymphadenopathy, no thyromegaly, no masses, normal ROM, no bruits Chest: non tender, normal shape and expansion Heart: RRR, normal S1, S2, no murmurs Lungs: CTA bilaterally, no wheezes, rhonchi, or rales Abdomen: +bs, soft, non tender, non distended, no masses, no hepatomegaly, no splenomegaly, no bruits Back: Slow with movement due to low back pain, otherwise non tender, normal ROM, no scoliosis Musculoskeletal: upper extremities non tender, no obvious deformity, normal ROM throughout, lower extremities non tender, no obvious deformity, normal ROM throughout Extremities: no edema, no cyanosis, no clubbing Pulses: 2+ symmetric, upper and lower extremities, normal cap refill Neurological: alert, oriented x 3, CN2-12 intact, strength normal upper extremities and lower extremities, sensation normal throughout, DTRs 2+ throughout, no cerebellar signs, gait normal Psychiatric: normal affect, behavior  normal, pleasant  GU: normal male external genitalia, uncircumcised, nontender, no masses, no hernia, no lymphadenopathy Rectal: Deferred  Diabetic Foot Exam - Simple   Simple Foot Form Diabetic Foot exam was performed with the following findings: Yes 06/01/2022  9:09 AM  Visual Inspection No deformities, no ulcerations, no other skin breakdown bilaterally: Yes Sensation Testing Intact to touch and monofilament testing bilaterally: Yes Pulse Check Posterior Tibialis and Dorsalis pulse intact bilaterally: Yes Comments       Assessment and Plan :   Encounter Diagnoses  Name Primary?   Encounter for health maintenance examination in adult Yes   Type II diabetes mellitus with complication (HCC)    Essential hypertension, benign    Bilateral arm numbness and tingling while sleeping    Hyperlipidemia, unspecified hyperlipidemia type    Vaccine counseling    Need for influenza vaccination    Loose stools    Flatulence     This  visit was a preventative care visit, also known as wellness visit or routine physical.   Topics typically include healthy lifestyle, diet, exercise, preventative care, vaccinations, sick and well care, proper use of emergency dept and after hours care, as well as other concerns.     Recommendations: Continue to return yearly for your annual wellness and preventative care visits.  This gives [place] a chance to discuss healthy lifestyle, exercise, vaccinations, review your chart record, and perform screenings where appropriate.  I recommend you see your eye doctor yearly for routine vision care.  I recommend you see your dentist yearly for routine dental care including hygiene visits twice yearly.   Vaccination recommendations were reviewed Immunization History  Administered Date(s) Administered   Influenza,inj,Quad PF,6+ Mos 05/28/2021   Influenza-Unspecified 02/25/2018   PFIZER(Purple Top)SARS-COV-2 Vaccination 03/10/2020, 03/31/2020   Tdap 01/08/2016     Counseled on the influenza virus vaccine.  Vaccine information sheet given.  Influenza vaccine given after consent obtained.    Screening for cancer: Colon cancer screening: Age 55  Testicular cancer screening You should do a monthly self testicular exam if you are between 55-49 years old  We discussed PSA, prostate exam, and prostate cancer screening risks/benefits.   Age 62  Skin cancer screening: Check your skin regularly for new changes, growing lesions, or other lesions of concern Come in for evaluation if you have skin lesions of concern.  Lung cancer screening: If you have a greater than 20 pack year history of tobacco use, then you may qualify for lung cancer screening with a chest CT scan.   Please call your insurance company to inquire about coverage for this test.  We currently don't have screenings for other cancers besides breast, cervical, colon, and lung cancers.  If you have a strong family history of cancer or have other cancer screening concerns, please let me know.    Bone health: Get at least 150 minutes of aerobic exercise weekly Get weight bearing exercise at least once weekly Bone density test:  A bone density test is an imaging test that uses a type of X-ray to measure the amount of calcium and other minerals in your bones. The test may be used to diagnose or screen you for a condition that causes weak or thin bones (osteoporosis), predict your risk for a broken bone (fracture), or determine how well your osteoporosis treatment is working. The bone density test is recommended for females 22 and older, or females or males <56 if certain risk factors such as thyroid disease, long term use of steroids such as for asthma or rheumatological issues, vitamin D deficiency, estrogen deficiency, family history of osteoporosis, self or family history of fragility fracture in first degree relative.    Heart health: Get at least 150 minutes of aerobic exercise  weekly Limit alcohol It is important to maintain a healthy blood pressure and healthy cholesterol numbers  Heart disease screening: Screening for heart disease includes screening for blood pressure, fasting lipids, glucose/diabetes screening, BMI height to weight ratio, reviewed of smoking status, physical activity, and diet.    Goals include blood pressure 120/80 or less, maintaining a healthy lipid/cholesterol profile, preventing diabetes or keeping diabetes numbers under good control, not smoking or using tobacco products, exercising most days per week or at least 150 minutes per week of exercise, and eating healthy variety of fruits and vegetables, healthy oils, and avoiding unhealthy food choices like fried food, fast food, high sugar and high cholesterol foods.    Other  tests may possibly include EKG test, CT coronary calcium score, echocardiogram, exercise treadmill stress test.   CT coronary test 06/2021 score of 0 thankfully.    Medical care options: I recommend you continue to seek care here first for routine care.  We try really hard to have available appointments Monday through Friday daytime hours for sick visits, acute visits, and physicals.  Urgent care should be used for after hours and weekends for significant issues that cannot wait till the next day.  The emergency department should be used for significant potentially life-threatening emergencies.  The emergency department is expensive, can often have long wait times for less significant concerns, so try to utilize primary care, urgent care, or telemedicine when possible to avoid unnecessary trips to the emergency department.  Virtual visits and telemedicine have been introduced since the pandemic started in 2020, and can be convenient ways to receive medical care.  We offer virtual appointments as well to assist you in a variety of options to seek medical care.   Separate significant issues discussed: Diabetes - updated labs  today, continue metformin.   Briefly discussed GLP1 medicaiton like ozempic.    Hypertension-continue current medication, updated labs today, begin monitoring BP at home some.   Discussed goal <130/80  Hyperlipidemia-continue current medication, updated labs today  Chronic back pain-improved of late  Paresthesais of arms - I recommended he make f/u appt with orthopedics now [facility]), and begin OTC carpal tunnel splints    Akira was seen today for annual exam.  Diagnoses and all orders for this visit:  Encounter for health maintenance examination in adult -     Comprehensive metabolic panel -     CBC -     Hemoglobin A1c -     Lipid panel -     Microalbumin/Creatinine Ratio, Urine -     POCT Urinalysis DIP (Proadvantage Device) -     Hepatitis C antibody  Type II diabetes mellitus with complication (HCC) -     Hemoglobin A1c -     Microalbumin/Creatinine Ratio, Urine  Essential hypertension, benign  Bilateral arm numbness and tingling while sleeping  Hyperlipidemia, unspecified hyperlipidemia type -     Lipid panel  Vaccine counseling  Need for influenza vaccination  Loose stools  Flatulence  Other orders -     Flu Vaccine QUAD 6+ mos PF IM (Fluarix Quad PF)    Follow-up pending labs, yearly for physical

## 2022-06-02 ENCOUNTER — Other Ambulatory Visit: Payer: Self-pay | Admitting: Medical

## 2022-06-02 LAB — HEMOGLOBIN A1C
Est. average glucose Bld gHb Est-mCnc: 120 mg/dL
Hgb A1c MFr Bld: 5.8 % — ABNORMAL HIGH (ref 4.8–5.6)

## 2022-06-02 LAB — LIPID PANEL
Chol/HDL Ratio: 4 ratio (ref 0.0–5.0)
Cholesterol, Total: 160 mg/dL (ref 100–199)
HDL: 40 mg/dL
LDL Chol Calc (NIH): 88 mg/dL (ref 0–99)
Triglycerides: 190 mg/dL — ABNORMAL HIGH (ref 0–149)
VLDL Cholesterol Cal: 32 mg/dL (ref 5–40)

## 2022-06-02 LAB — COMPREHENSIVE METABOLIC PANEL
ALT: 26 IU/L (ref 0–44)
AST: 19 IU/L (ref 0–40)
Albumin/Globulin Ratio: 2.2 (ref 1.2–2.2)
Albumin: 4.8 g/dL (ref 4.1–5.1)
Alkaline Phosphatase: 70 IU/L (ref 44–121)
BUN/Creatinine Ratio: 13 (ref 9–20)
BUN: 13 mg/dL (ref 6–24)
Bilirubin Total: 0.5 mg/dL (ref 0.0–1.2)
CO2: 23 mmol/L (ref 20–29)
Calcium: 9.2 mg/dL (ref 8.7–10.2)
Chloride: 101 mmol/L (ref 96–106)
Creatinine, Ser: 0.98 mg/dL (ref 0.76–1.27)
Globulin, Total: 2.2 g/dL (ref 1.5–4.5)
Glucose: 109 mg/dL — ABNORMAL HIGH (ref 70–99)
Potassium: 4.4 mmol/L (ref 3.5–5.2)
Sodium: 141 mmol/L (ref 134–144)
Total Protein: 7 g/dL (ref 6.0–8.5)
eGFR: 98 mL/min/{1.73_m2} (ref >59)

## 2022-06-02 LAB — CBC
Hematocrit: 47.9 % (ref 37.5–51.0)
Hemoglobin: 16.3 g/dL (ref 13.0–17.7)
MCH: 29.5 pg (ref 26.6–33.0)
MCHC: 34 g/dL (ref 31.5–35.7)
MCV: 87 fL (ref 79–97)
Platelets: 299 x10E3/uL (ref 150–450)
RBC: 5.52 x10E6/uL (ref 4.14–5.80)
RDW: 12.2 % (ref 11.6–15.4)
WBC: 5 x10E3/uL (ref 3.4–10.8)

## 2022-06-02 LAB — MICROALBUMIN / CREATININE URINE RATIO
Creatinine, Urine: 37.6 mg/dL
Microalb/Creat Ratio: <8 mg/g creat (ref 0–29)
Microalbumin, Urine: <3 ug/mL

## 2022-06-02 LAB — HEPATITIS C ANTIBODY: Hep C Virus Ab: NONREACTIVE

## 2022-06-02 MED ORDER — ROSUVASTATIN CALCIUM 20 MG PO TABS: 20.0000 mg | ORAL_TABLET | Freq: Every day | ORAL | 3 refills | Status: DC

## 2022-06-02 NOTE — Progress Notes (Signed)
Results sent through MyChart

## 2022-06-10 ENCOUNTER — Telehealth (INDEPENDENT_AMBULATORY_CARE_PROVIDER_SITE_OTHER): Payer: 59 | Admitting: Nurse Practitioner

## 2022-06-10 ENCOUNTER — Other Ambulatory Visit (INDEPENDENT_AMBULATORY_CARE_PROVIDER_SITE_OTHER): Payer: 59

## 2022-06-10 ENCOUNTER — Encounter: Payer: Self-pay | Admitting: Nurse Practitioner

## 2022-06-10 VITALS — Temp 98.4°F

## 2022-06-10 DIAGNOSIS — R6889 Other general symptoms and signs: Secondary | ICD-10-CM | POA: Diagnosis not present

## 2022-06-10 LAB — POCT INFLUENZA A/B
Influenza A, POC: NEGATIVE
Influenza B, POC: NEGATIVE

## 2022-06-10 MED ORDER — HYDROCODONE BIT-HOMATROP MBR 5-1.5 MG/5ML PO SOLN: 5.0000 mL | Freq: Every evening | ORAL | 0 refills | Status: DC | PRN

## 2022-06-10 MED ORDER — AZITHROMYCIN 250 MG PO TABS: ORAL_TABLET | ORAL | 0 refills | Status: AC

## 2022-06-10 MED ORDER — BENZONATATE 200 MG PO CAPS: 200.0000 mg | ORAL_CAPSULE | Freq: Three times a day (TID) | ORAL | 0 refills | Status: DC | PRN

## 2022-06-10 MED ORDER — OSELTAMIVIR PHOSPHATE 75 MG PO CAPS: 75.0000 mg | ORAL_CAPSULE | Freq: Two times a day (BID) | ORAL | 0 refills | Status: DC

## 2022-06-10 NOTE — Assessment & Plan Note (Signed)
Patient presents with flu-like symptoms for about 5 days. At this time it appears that COVID is negative, however, he is unable to test for flu and RSV at home. I suspect that this is still viral in nature as he is not currently running a fever and it appears that the course has not improved and worsened, but given the length of time he has been ill it is possible that bacterial etiology may present in the next several days. We discussed the option to come by the office for flu and RSV testing. He is waiting to hear back from the pediatrician about his daughter, but will let us know if he decides to come by for a test. For now I will go ahead and send in tamiflu for presumptive treatment given that he is on the last day of eligibility. Will also send hycodan for cough at night and tessalon for daytime use. Recommend rest and increased hydration. Supportive treatment options discussed. I will also send in azithromycin in the event he has improvement but then worsens over the holiday weekend. He understands not to start this unless his symptoms are still present after 7 days and/or his symptoms improve then worsen again. He will follow up if he does not have full resolution of symptoms within the next week.

## 2022-06-10 NOTE — Progress Notes (Signed)
Virtual Visit Encounter mychart visit.   I connected with  Crissie Reese on 06/10/22 at  8:45 AM EST by secure video and audio telemedicine application. I verified that I am speaking with the correct person using two identifiers.   I introduced myself as a Designer, jewellery with the practice. The limitations of evaluation and management by telemedicine discussed with the patient and the availability of in person appointments. The patient expressed verbal understanding and consent to proceed.  Participating parties in this visit include: Myself and patient  The patient is: Patient Location: Home I am: Provider Location: Office/Clinic Subjective:    CC and HPI: PAXTIN YAX is a 41 y.o. year old male presenting for new evaluation and treatment of flu-like symptoms. Patient reports the following: Cough, congestion, fever, rhinorrhea, fatigue, malaise for about 4-5 days. His cough is keeping him up at night. He has taken a COVID test and this was negative. He received his flu shot several days before symptoms started. He has been taking OTC medication for the fever and this has helped. His 86 year old daughter has the same symptoms with higher fever.    Past medical history, Surgical history, Family history not pertinant except as noted below, Social history, Allergies, and medications have been entered into the medical record, reviewed, and corrections made.   Review of Systems:  All review of systems negative except what is listed in the HPI  Objective:    Alert and oriented x 4 Audible congestion present Speaking in clear sentences with no shortness of breath. No distress.  Impression and Recommendations:    Problem List Items Addressed This Visit     Flu-like symptoms - Primary    Patient presents with flu-like symptoms for about 5 days. At this time it appears that [place] is negative, however, he is unable to test for flu and RSV at home. I suspect that this is still viral  in nature as he is not currently running a fever and it appears that the course has not improved and worsened, but given the length of time he has been ill it is possible that bacterial etiology may present in the next several days. We discussed the option to come by the office for flu and RSV testing. He is waiting to hear back from the pediatrician about his daughter, but will let us know if he decides to come by for a test. For now I will go ahead and send in tamiflu for presumptive treatment given that he is on the last day of eligibility. Will also send hycodan for cough at night and tessalon for daytime use. Recommend rest and increased hydration. Supportive treatment options discussed. I will also send in azithromycin in the event he has improvement but then worsens over the holiday weekend. He understands not to start this unless his symptoms are still present after 7 days and/or his symptoms improve then worsen again. He will follow up if he does not have full resolution of symptoms within the next week.       Relevant Medications   oseltamivir (TAMIFLU) 75 MG capsule   benzonatate (TESSALON) 200 MG capsule   HYDROcodone bit-homatropine (HYCODAN) 5-1.5 MG/5ML syrup    orders and follow up as documented in EMR I discussed the assessment and treatment plan with the patient. The patient was provided an opportunity to ask questions and all were answered. The patient agreed with the plan and demonstrated an understanding of the instructions.   The patient  was advised to call back or seek an in-person evaluation if the symptoms worsen or if the condition fails to improve as anticipated.  Follow-Up: prn  I provided 17 minutes of non-face-to-face interaction with this non face-to-face encounter including intake, same-day documentation, and chart review.   Tollie Eth, NP , DNP, AGNP-c [facility] at [facility] [phone] (fax)

## 2022-06-11 ENCOUNTER — Telehealth: Payer: Self-pay | Admitting: Internal Medicine

## 2022-06-11 MED ORDER — HYDROCOD POLI-CHLORPHE POLI ER 10-8 MG/5ML PO SUER: 5.0000 mL | Freq: Two times a day (BID) | ORAL | 0 refills | Status: DC | PRN

## 2022-06-11 NOTE — Telephone Encounter (Signed)
Pt was notified and actually was able to pick it up today as they got it in stock.. no need to send elsewhere

## 2022-06-11 NOTE — Telephone Encounter (Signed)
Hydrocodone cough medication is on backorder and unavailable. Please advise

## 2022-06-11 NOTE — Telephone Encounter (Signed)
I can send to a different pharmacy if he would like. Unfortunately, I have sent two different kinds in and both are not available at that pharmacy. I believe the one on [address] may have it in stock if that is a convenient location.

## 2022-06-11 NOTE — Addendum Note (Signed)
Addended by: Raveen Wieseler, [place] Dec E on: 06/11/2022 09:54 AM   Modules accepted: Orders

## 2022-06-25 ENCOUNTER — Other Ambulatory Visit: Payer: Self-pay | Admitting: Nurse Practitioner

## 2022-06-25 DIAGNOSIS — R6889 Other general symptoms and signs: Secondary | ICD-10-CM

## 2022-06-25 DIAGNOSIS — J069 Acute upper respiratory infection, unspecified: Secondary | ICD-10-CM

## 2022-06-25 MED ORDER — HYDROCOD POLI-CHLORPHE POLI ER 10-8 MG/5ML PO SUER: 5.0000 mL | Freq: Every evening | ORAL | 0 refills | Status: DC | PRN

## 2022-06-25 MED ORDER — AMOXICILLIN-POT CLAVULANATE 875-125 MG PO TABS: 1.0000 | ORAL_TABLET | Freq: Two times a day (BID) | ORAL | 0 refills | Status: DC

## 2022-06-29 ENCOUNTER — Telehealth: Payer: Self-pay | Admitting: Medical

## 2022-06-29 NOTE — Telephone Encounter (Signed)
Cristian Walker called and stated when he went to get the cough syrup you prescribed it was on back order and so he wasn't able to get it and wants to know if their is an alternative you can send in.  He has been taking the antibiotic and is feeling better except he still has a cough

## 2022-08-14 ENCOUNTER — Other Ambulatory Visit: Payer: Self-pay | Admitting: Medical

## 2022-09-05 ENCOUNTER — Other Ambulatory Visit: Payer: Self-pay | Admitting: Medical

## 2022-10-22 ENCOUNTER — Other Ambulatory Visit: Payer: Self-pay

## 2022-10-22 ENCOUNTER — Encounter (HOSPITAL_BASED_OUTPATIENT_CLINIC_OR_DEPARTMENT_OTHER): Payer: Self-pay

## 2022-10-22 ENCOUNTER — Emergency Department (HOSPITAL_BASED_OUTPATIENT_CLINIC_OR_DEPARTMENT_OTHER)
Admission: EM | Admit: 2022-10-22 | Discharge: 2022-10-22 | Disposition: A | Discharge status: Home / Self Care | Payer: 59 | Attending: Emergency Medicine | Admitting: Emergency Medicine

## 2022-10-22 DIAGNOSIS — M545 Low back pain, unspecified: Secondary | ICD-10-CM | POA: Diagnosis present

## 2022-10-22 DIAGNOSIS — M5442 Lumbago with sciatica, left side: Secondary | ICD-10-CM | POA: Insufficient documentation

## 2022-10-22 LAB — URINALYSIS, ROUTINE W REFLEX MICROSCOPIC
Bilirubin Urine: NEGATIVE
Glucose, UA: NEGATIVE mg/dL
Hgb urine dipstick: NEGATIVE
Ketones, ur: NEGATIVE mg/dL
Leukocytes,Ua: NEGATIVE
Nitrite: NEGATIVE
Protein, ur: NEGATIVE mg/dL
Specific Gravity, Urine: 1.007 (ref 1.005–1.030)
pH: 7 (ref 5.0–8.0)

## 2022-10-22 MED ORDER — KETOROLAC TROMETHAMINE 15 MG/ML IJ SOLN
15.0000 mg | Freq: Once | INTRAMUSCULAR | Status: AC
  Administered 2022-10-22: 15 mg via INTRAMUSCULAR
  Filled 2022-10-22: qty 1

## 2022-10-22 MED ORDER — DICLOFENAC SODIUM 1 % EX GEL: 4.0000 g | Freq: Four times a day (QID) | CUTANEOUS | 0 refills | Status: DC

## 2022-10-22 MED ORDER — ACETAMINOPHEN 500 MG PO TABS
1000.0000 mg | ORAL_TABLET | Freq: Once | ORAL | Status: AC
  Administered 2022-10-22: 1000 mg via ORAL
  Filled 2022-10-22: qty 2

## 2022-10-22 MED ORDER — OXYCODONE HCL 5 MG PO TABS
5.0000 mg | ORAL_TABLET | Freq: Once | ORAL | Status: AC
  Administered 2022-10-22: 5 mg via ORAL
  Filled 2022-10-22: qty 1

## 2022-10-22 MED ORDER — METHYLPREDNISOLONE 4 MG PO TBPK: ORAL_TABLET | ORAL | 0 refills | Status: DC

## 2022-10-22 MED ORDER — DIAZEPAM 5 MG PO TABS
5.0000 mg | ORAL_TABLET | Freq: Once | ORAL | Status: AC
  Administered 2022-10-22: 5 mg via ORAL
  Filled 2022-10-22: qty 1

## 2022-10-22 MED ORDER — METHOCARBAMOL 500 MG PO TABS: 500.0000 mg | ORAL_TABLET | Freq: Two times a day (BID) | ORAL | 0 refills | Status: DC

## 2022-10-22 NOTE — Discharge Instructions (Signed)
Your back pain is most likely due to a muscular strain.  There is been a lot of research on back pain, unfortunately the only thing that seems to really help is Tylenol and ibuprofen.  Relative rest is also important to not lift greater than 10 pounds bending or twisting at the waist.  Please follow-up with your family physician.  The other thing that really seems to benefit patients is physical therapy which your doctor may send you for.  Please return to the emergency department for new numbness or weakness to your arms or legs. Difficulty with urinating or urinating or pooping on yourself.  Also if you cannot feel toilet paper when you wipe or get a fever.   Take 4 over the counter ibuprofen tablets 3 times a day or 2 over-the-counter naproxen tablets twice a day for pain. Also take tylenol 1000mg(2 extra strength) four times a day.   You can try stretches.   https://www.youtube.com/watch?v=CdCClhtKH2Q 

## 2022-10-22 NOTE — ED Provider Notes (Signed)
[place] EMERGENCY DEPARTMENT AT [facility] Provider Note   CSN: [id number] Arrival date & time: 10/22/22  2003     History  Chief Complaint  Patient presents with   Back Pain    Cristian Walker is a 42 y.o. male.  42 yo M with a cc of left sided low back.  Going on for a few weeks.  He denies any specific trauma but had been working in a ditch taking for gas pipe work and had significant pain the day afterwards.  Has tried Tylenol ibuprofen and gabapentin at home without significant improvement.  He has had trouble with his back in the past.  Tried to schedule an appointment with neurosurgery but is unable for a few weeks.  Denies loss of bowel or bladder denies loss of perirectal sensation denies numbness weakness to the legs.   Back Pain      Home Medications Prior to Admission medications   Medication Sig Start Date End Date Taking? Authorizing Provider  diclofenac Sodium (VOLTAREN) 1 % GEL Apply 4 g topically 4 (four) times daily. 10/22/22  Yes Melene Plan, DO  methocarbamol (ROBAXIN) 500 MG tablet Take 1 tablet (500 mg total) by mouth 2 (two) times daily. 10/22/22  Yes Melene Plan, DO  methylPREDNISolone (MEDROL DOSEPAK) 4 MG TBPK tablet Day 1: 8mg  before breakfast, 4 mg after lunch, 4 mg after supper, and 8 mg at bedtime Day 2: 4 mg before breakfast, 4 mg after lunch, 4 mg  after supper, and 8 mg  at bedtime Day 3:  4 mg  before breakfast, 4 mg  after lunch, 4 mg after supper, and 4 mg  at bedtime Day 4: 4 mg  before breakfast, 4 mg  after lunch, and 4 mg at bedtime Day 5: 4 mg  before breakfast and 4 mg at bedtime Day 6: 4 mg  before breakfast 10/22/22  Yes Melene Plan, DO  ACCU-CHEK GUIDE test strip USE TO TEST ONCE DAILY 09/06/22   Tysinger, Kermit Balo, PA-C  acetaminophen (TYLENOL) 500 MG tablet Take 1,000 mg by mouth every 6 (six) hours as needed.    [provider]  amoxicillin-clavulanate (AUGMENTIN) 875-125 MG tablet Take 1 tablet by mouth 2 (two) times daily.  06/25/22   Tollie Eth, NP  benzonatate (TESSALON) 200 MG capsule Take 1 capsule (200 mg total) by mouth 3 (three) times daily as needed for cough. 06/10/22   Tollie Eth, NP  blood glucose meter kit and supplies Dispense based on patient and insurance preference. Test once a daily 07/31/21   Tysinger, Kermit Balo, PA-C  chlorpheniramine-HYDROcodone (TUSSIONEX) 10-8 MG/5ML Take 5 mLs by mouth at bedtime as needed for cough (cough, will cause drowsiness.). 06/25/22   Tollie Eth, NP  gabapentin (NEURONTIN) 300 MG capsule Take 300 mg by mouth daily. 07/03/21   [provider]  lisinopril (ZESTRIL) 10 MG tablet Take 1 tablet (10 mg total) by mouth daily. 11/17/21 11/17/22  Tysinger, Kermit Balo, PA-C  metFORMIN (GLUCOPHAGE) 500 MG tablet Take 1 tablet (500 mg total) by mouth 2 (two) times daily with a meal. 10/20/21   Tysinger, Kermit Balo, PA-C  oseltamivir (TAMIFLU) 75 MG capsule Take 1 capsule (75 mg total) by mouth 2 (two) times daily. 06/10/22   Tollie Eth, NP  rosuvastatin (CRESTOR) 20 MG tablet Take 1 tablet (20 mg total) by mouth daily. 06/02/22   Tysinger, Kermit Balo, PA-C      Allergies    Patient has  no known allergies.    Review of Systems   Review of Systems  Musculoskeletal:  Positive for back pain.    Physical Exam Updated Vital Signs BP (!) 153/107   Pulse 77   Temp 98.3 F (36.8 C)   Resp 18   Ht 5\' 6"  (1.676 m)   Wt 79.4 kg   SpO2 98%   BMI 28.25 kg/m  Physical Exam Vitals and nursing note reviewed.  Constitutional:      Appearance: He is well-developed.  HENT:     Head: Normocephalic and atraumatic.  Eyes:     Pupils: Pupils are equal, round, and reactive to light.  Neck:     Vascular: No JVD.  Cardiovascular:     Rate and Rhythm: Normal rate and regular rhythm.     Heart sounds: No murmur heard.    No friction rub. No gallop.  Pulmonary:     Effort: No respiratory distress.     Breath sounds: No wheezing.  Abdominal:     General: There is no distension.      Tenderness: There is no abdominal tenderness. There is no guarding or rebound.  Musculoskeletal:        General: Normal range of motion.     Cervical back: Normal range of motion and neck supple.     Comments: Pulse motor and sensation intact to the left lower extremity.  Reflexes 2+ and equal.  No clonus.  No obvious midline spinal tenderness step-offs or deformities.  He has some mild paraspinal pain to the lower aspect of the L-spine worse on the left.  Skin:    Coloration: Skin is not pale.     Findings: No rash.  Neurological:     Mental Status: He is alert and oriented to person, place, and time.  Psychiatric:        Behavior: Behavior normal.     ED Results / Procedures / Treatments   Labs (all labs ordered are listed, but only abnormal results are displayed) Labs Reviewed  URINALYSIS, ROUTINE W REFLEX MICROSCOPIC - Abnormal; Notable for the following components:      Result Value   Color, Urine COLORLESS (*)    All other components within normal limits    EKG None  Radiology No results found.  Procedures Procedures    Medications Ordered in ED Medications  ketorolac (TORADOL) 15 MG/ML injection 15 mg (15 mg Intramuscular Given 10/22/22 2246)  acetaminophen (TYLENOL) tablet 1,000 mg (1,000 mg Oral Given 10/22/22 2246)  oxyCODONE (Oxy IR/ROXICODONE) immediate release tablet 5 mg (5 mg Oral Given 10/22/22 2247)  diazepam (VALIUM) tablet 5 mg (5 mg Oral Given 10/22/22 2247)    ED Course/ Medical Decision Making/ A&P                             Medical Decision Making Amount and/or Complexity of Data Reviewed Labs: ordered.  Risk OTC drugs. Prescription drug management.   42 yo M with a chief complaint of left-sided low back pain that radiates down the leg.  This been a chronic problem for him.  Seems to come and gone.  He had worsening after doing some hard labor at work.  No red flags.  Treat pain here.  Neurosurgery follow-up.  11:32 PM:  I have discussed the  diagnosis/risks/treatment options with the patient.  Evaluation and diagnostic testing in the emergency department does not suggest an emergent condition requiring admission or immediate  intervention beyond what has been performed at this time.  They will follow up with PCP, neurosurger. We also discussed returning to the ED immediately if new or worsening sx occur. We discussed the sx which are most concerning (e.g., sudden worsening pain, fever, inability to tolerate by mouth, cauda equina s/sx) that necessitate immediate return. Medications administered to the patient during their visit and any new prescriptions provided to the patient are listed below.  Medications given during this visit Medications  ketorolac (TORADOL) 15 MG/ML injection 15 mg (15 mg Intramuscular Given 10/22/22 2246)  acetaminophen (TYLENOL) tablet 1,000 mg (1,000 mg Oral Given 10/22/22 2246)  oxyCODONE (Oxy IR/ROXICODONE) immediate release tablet 5 mg (5 mg Oral Given 10/22/22 2247)  diazepam (VALIUM) tablet 5 mg (5 mg Oral Given 10/22/22 2247)     The patient appears reasonably screen and/or stabilized for discharge and I doubt any other medical condition or other [facility] requiring further screening, evaluation, or treatment in the ED at this time prior to discharge.          Final Clinical Impression(s) / ED Diagnoses Final diagnoses:  Acute left-sided low back pain with left-sided sciatica    Rx / DC Orders ED Discharge Orders          Ordered    methylPREDNISolone (MEDROL DOSEPAK) 4 MG TBPK tablet        10/22/22 2244    diclofenac Sodium (VOLTAREN) 1 % GEL  4 times daily        10/22/22 2245    methocarbamol (ROBAXIN) 500 MG tablet  2 times daily        10/22/22 2245              Melene Plan, DO 10/22/22 2332

## 2022-10-22 NOTE — ED Triage Notes (Signed)
Patient here POV from Home.  Endorses Lower Back Pain that began 3 Weeks ago. Radiates to Lower Left Sided and down Left Leg. No Known Trauma but does endorse frequent movement at work. No Dysuria.   NAD Noted during Triage. A&Ox4. GCS 15. Ambulatory.

## 2022-10-23 ENCOUNTER — Other Ambulatory Visit: Payer: Self-pay | Admitting: Medical

## 2022-11-05 ENCOUNTER — Other Ambulatory Visit: Payer: Self-pay | Admitting: Medical

## 2023-01-12 ENCOUNTER — Encounter: Payer: Self-pay | Admitting: Medical

## 2023-01-12 ENCOUNTER — Ambulatory Visit: Payer: 59 | Admitting: Medical

## 2023-01-12 VITALS — BP 124/78 | HR 64 | Wt 173.0 lb

## 2023-01-12 DIAGNOSIS — E785 Hyperlipidemia, unspecified: Secondary | ICD-10-CM | POA: Diagnosis not present

## 2023-01-12 DIAGNOSIS — L989 Disorder of the skin and subcutaneous tissue, unspecified: Secondary | ICD-10-CM

## 2023-01-12 DIAGNOSIS — I10 Essential (primary) hypertension: Secondary | ICD-10-CM | POA: Diagnosis not present

## 2023-01-12 DIAGNOSIS — E118 Type 2 diabetes mellitus with unspecified complications: Secondary | ICD-10-CM

## 2023-01-12 LAB — POCT GLYCOSYLATED HEMOGLOBIN (HGB A1C): Hemoglobin A1C: 6.2 % — AB (ref 4.0–5.6)

## 2023-01-12 NOTE — Progress Notes (Signed)
Subjective:  Cristian Walker is a 42 y.o. 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  Needed updated HgbA1C for DOT.  Diabetes-compliant with metformin 500 mg twice daily. Checks every morning, sugars usually low 100s.    Hypertension-compliant with lisinopril 10 mg daily.  Hyperlipidemia-compliant with Crestor 20 mg daily without complaint.  He continues to have ongoing chronic back pain.  Has seen specialist prior.  Has skin concerns, left face, left ear flaky and itchy   No other aggravating or relieving factors.    No other c/o.  Past Medical History:  Diagnosis Date   HNP (herniated nucleus pulposus), cervical    C4-5   Hyperlipidemia    Hypertension    Spontaneous pneumothorax    3 times, 2009, 2007, 2005   Current Outpatient Medications on File Prior to Visit  Medication Sig Dispense Refill   ACCU-CHEK GUIDE test strip USE TO TEST ONCE DAILY 100 strip 2   acetaminophen (TYLENOL) 500 MG tablet Take 1,000 mg by mouth every 6 (six) hours as needed.     blood glucose meter kit and supplies Dispense based on patient and insurance preference. Test once a daily 1 each 0   diclofenac Sodium (VOLTAREN) 1 % GEL Apply 4 g topically 4 (four) times daily. 100 g 0   gabapentin (NEURONTIN) 300 MG capsule Take 300 mg by mouth daily.     lisinopril (ZESTRIL) 10 MG tablet TAKE 1 TABLET BY MOUTH EVERY DAY 90 tablet 1   metFORMIN (GLUCOPHAGE) 500 MG tablet TAKE 1 TABLET BY MOUTH 2 TIMES DAILY WITH A MEAL. 180 tablet 1   rosuvastatin (CRESTOR) 20 MG tablet Take 1 tablet (20 mg total) by mouth daily. 90 tablet 3   amoxicillin-clavulanate (AUGMENTIN) 875-125 MG tablet Take 1 tablet by mouth 2 (two) times daily. (Patient not taking: Reported on 01/12/2023) 10 tablet 0   benzonatate (TESSALON) 200 MG capsule Take 1 capsule (200 mg  total) by mouth 3 (three) times daily as needed for cough. (Patient not taking: Reported on 01/12/2023) 45 capsule 0   chlorpheniramine-HYDROcodone (TUSSIONEX) 10-8 MG/5ML Take 5 mLs by mouth at bedtime as needed for cough (cough, will cause drowsiness.). (Patient not taking: Reported on 01/12/2023) 60 mL 0   methocarbamol (ROBAXIN) 500 MG tablet Take 1 tablet (500 mg total) by mouth 2 (two) times daily. (Patient not taking: Reported on 01/12/2023) 10 tablet 0   methylPREDNISolone (MEDROL DOSEPAK) 4 MG TBPK tablet Day 1: 8mg  before breakfast, 4 mg after lunch, 4 mg after supper, and 8 mg at bedtime Day 2: 4 mg before breakfast, 4 mg after lunch, 4 mg  after supper, and 8 mg  at bedtime Day 3:  4 mg  before breakfast, 4 mg  after lunch, 4 mg after supper, and 4 mg  at bedtime Day 4: 4 mg  before breakfast, 4 mg  after lunch, and 4 mg at bedtime Day 5: 4 mg  before breakfast and 4 mg at bedtime Day 6: 4 mg  before breakfast (Patient not taking: Reported on 01/12/2023) 1 each 0   oseltamivir (TAMIFLU) 75 MG capsule Take 1 capsule (75 mg total) by mouth 2 (two) times daily. (Patient not taking: Reported on 01/12/2023) 10 capsule 0   No current facility-administered medications on file prior  to visit.    The following portions of the patient's history were reviewed and updated as appropriate: allergies, current medications, past family history, past medical history, past social history, past surgical history and problem list.  ROS Otherwise as in subjective above     Objective: BP 124/78   Pulse 64   Wt 173 lb (78.5 kg)   BMI 27.92 kg/m   Wt Readings from Last 3 Encounters:  01/12/23 173 lb (78.5 kg)  10/22/22 175 lb (79.4 kg)  06/01/22 176 lb (79.8 kg)    General appearance: alert, no distress, well developed, well nourished Skin: left temple region 54mm-5mm diameter, yellows brown, round.  Left ear lobe with pinkish and scaly appearing Neck: supple, no lymphadenopathy, no thyromegaly, no  masses Heart: RRR, normal S1, S2, no murmurs Lungs: CTA bilaterally, no wheezes, rhonchi, or rales Pulses: 2+ radial pulses, 2+ pedal pulses, normal cap refill Ext: no edema    Assessment: Encounter Diagnoses  Name Primary?   Type II diabetes mellitus with complication (HCC) Yes   Hyperlipidemia, unspecified hyperlipidemia type    Essential hypertension, benign    Skin lesion      Plan: Diabetes - hgba1c 6.2% today.  He wants to lower metformin to once daily which is fine . Continue monitoring glucose.   Continue with healthy diet and exercise  HTN - c/t same medication  Hyperlipidemia - c/t same medication  Skin lesion  left face, referral to derm.   Left ear lobe, begin daily sun block in morning, moisturizing lotion at night, refer to dermatology   Cristian Walker was seen today for diabetes.  Diagnoses and all orders for this visit:  Type II diabetes mellitus with complication (HCC) -     HgB A1c  Hyperlipidemia, unspecified hyperlipidemia type  Essential hypertension, benign  Skin lesion -     Ambulatory referral to Dermatology   Follow up: 23mo

## 2023-04-20 ENCOUNTER — Other Ambulatory Visit: Payer: Self-pay | Admitting: Medical

## 2023-04-20 NOTE — Telephone Encounter (Signed)
Has an appt in December 

## 2023-04-29 ENCOUNTER — Other Ambulatory Visit: Payer: Self-pay | Admitting: Medical

## 2023-04-29 NOTE — Telephone Encounter (Signed)
Has an appt in December 

## 2023-05-05 ENCOUNTER — Other Ambulatory Visit: Payer: Self-pay | Admitting: Medical

## 2023-06-06 ENCOUNTER — Other Ambulatory Visit: Payer: Self-pay | Admitting: Neurological Surgery

## 2023-06-06 DIAGNOSIS — M461 Sacroiliitis, not elsewhere classified: Secondary | ICD-10-CM

## 2023-06-09 ENCOUNTER — Encounter: Payer: Self-pay | Admitting: Medical

## 2023-06-09 ENCOUNTER — Ambulatory Visit: Payer: 59 | Admitting: Medical

## 2023-06-09 VITALS — BP 122/82 | HR 81 | Ht 66.0 in | Wt 175.2 lb

## 2023-06-09 DIAGNOSIS — E118 Type 2 diabetes mellitus with unspecified complications: Secondary | ICD-10-CM | POA: Diagnosis not present

## 2023-06-09 DIAGNOSIS — G8929 Other chronic pain: Secondary | ICD-10-CM

## 2023-06-09 DIAGNOSIS — Z0000 Encounter for general adult medical examination without abnormal findings: Secondary | ICD-10-CM | POA: Diagnosis not present

## 2023-06-09 DIAGNOSIS — E785 Hyperlipidemia, unspecified: Secondary | ICD-10-CM

## 2023-06-09 DIAGNOSIS — Z1389 Encounter for screening for other disorder: Secondary | ICD-10-CM | POA: Diagnosis not present

## 2023-06-09 DIAGNOSIS — Z1211 Encounter for screening for malignant neoplasm of colon: Secondary | ICD-10-CM

## 2023-06-09 DIAGNOSIS — M545 Low back pain, unspecified: Secondary | ICD-10-CM

## 2023-06-09 DIAGNOSIS — I10 Essential (primary) hypertension: Secondary | ICD-10-CM

## 2023-06-09 LAB — POCT URINALYSIS DIP (PROADVANTAGE DEVICE)
Bilirubin, UA: NEGATIVE
Blood, UA: NEGATIVE
Glucose, UA: NEGATIVE mg/dL
Ketones, POC UA: NEGATIVE mg/dL
Leukocytes, UA: NEGATIVE
Nitrite, UA: NEGATIVE
Protein Ur, POC: NEGATIVE mg/dL
Specific Gravity, Urine: 1.005
Urobilinogen, Ur: 0.2
pH, UA: 7 (ref 5.0–8.0)

## 2023-06-09 NOTE — Progress Notes (Signed)
Subjective:   HPI  Cristian Walker is a 42 y.o. male who presents for Chief Complaint  Patient presents with   Annual Exam    Fasting     Patient Care Team: Elex Mainwaring, Cleda Mccreedy as PCP - General (Family Medicine) Micki Riley, Harlin Rain, NP as Nurse Practitioner (Pain Medicine) Dr. Shaune Pollack, ortho [facility] Medical pain management Dentist Eye doctor   Concerns: Here for physical and labs  In January having SI injection per ortho, CT guided.  He felt like his back herniated again.  Has 2 bulging disc as well.     Hyperlipidemia - compliant with Crestor 20mg  daily without c/o  Diabetes - compliant with metformin 500mg  BID  HTN - compliant with lisinopril 10mg  daily  On several medications per pain management.   Reviewed their medical, surgical, family, social, medication, and allergy history and updated chart as appropriate.  No Known Allergies  Past Medical History:  Diagnosis Date   Diabetes mellitus without complication (HCC) 2023   HNP (herniated nucleus pulposus), cervical    C4-5   Hyperlipidemia    Hypertension    Spontaneous pneumothorax    3 times, 2009, 2007, 2005    Current Outpatient Medications on File Prior to Visit  Medication Sig Dispense Refill   ACCU-CHEK GUIDE test strip USE TO TEST ONCE DAILY 100 strip 2   acetaminophen (TYLENOL) 500 MG tablet Take 1,000 mg by mouth every 6 (six) hours as needed.     blood glucose meter kit and supplies Dispense based on patient and insurance preference. Test once a daily 1 each 0   gabapentin (NEURONTIN) 300 MG capsule Take 300 mg by mouth daily.     HYDROcodone-acetaminophen (NORCO/VICODIN) 5-325 MG tablet Take 1 tablet by mouth every 8 (eight) hours.     ibuprofen (ADVIL) 800 MG tablet Take 800 mg by mouth every 8 (eight) hours as needed.     lidocaine (HM LIDOCAINE PATCH) 4 % Place 1 patch onto the skin daily.     lisinopril (ZESTRIL) 10 MG tablet TAKE 1 TABLET BY MOUTH EVERY DAY 90 tablet 0    metFORMIN (GLUCOPHAGE) 500 MG tablet TAKE 1 TABLET BY MOUTH TWICE A DAY WITH FOOD 180 tablet 0   methocarbamol (ROBAXIN) 500 MG tablet Take 1 tablet (500 mg total) by mouth 2 (two) times daily. 10 tablet 0   rosuvastatin (CRESTOR) 20 MG tablet TAKE 1 TABLET BY MOUTH EVERY DAY 90 tablet 0   No current facility-administered medications on file prior to visit.      Current Outpatient Medications:    ACCU-CHEK GUIDE test strip, USE TO TEST ONCE DAILY, Disp: 100 strip, Rfl: 2   acetaminophen (TYLENOL) 500 MG tablet, Take 1,000 mg by mouth every 6 (six) hours as needed., Disp: , Rfl:    blood glucose meter kit and supplies, Dispense based on patient and insurance preference. Test once a daily, Disp: 1 each, Rfl: 0   gabapentin (NEURONTIN) 300 MG capsule, Take 300 mg by mouth daily., Disp: , Rfl:    HYDROcodone-acetaminophen (NORCO/VICODIN) 5-325 MG tablet, Take 1 tablet by mouth every 8 (eight) hours., Disp: , Rfl:    ibuprofen (ADVIL) 800 MG tablet, Take 800 mg by mouth every 8 (eight) hours as needed., Disp: , Rfl:    lidocaine (HM LIDOCAINE PATCH) 4 %, Place 1 patch onto the skin daily., Disp: , Rfl:    lisinopril (ZESTRIL) 10 MG tablet, TAKE 1 TABLET BY MOUTH EVERY DAY, Disp: 90 tablet, Rfl:  0   metFORMIN (GLUCOPHAGE) 500 MG tablet, TAKE 1 TABLET BY MOUTH TWICE A DAY WITH FOOD, Disp: 180 tablet, Rfl: 0   methocarbamol (ROBAXIN) 500 MG tablet, Take 1 tablet (500 mg total) by mouth 2 (two) times daily., Disp: 10 tablet, Rfl: 0   rosuvastatin (CRESTOR) 20 MG tablet, TAKE 1 TABLET BY MOUTH EVERY DAY, Disp: 90 tablet, Rfl: 0  Family History  Problem Relation Age of Onset   Hypertension Mother    Heart disease Father 55       stent   Alcohol abuse Father    Heart disease Paternal Uncle        CABG   Cancer Maternal Grandfather        lung, asbestos    Past Surgical History:  Procedure Laterality Date   ANTERIOR CERVICAL DECOMP/DISCECTOMY FUSION N/A 11/23/2017   Procedure: C4-5  ANTERIOR CERVICAL DECOMPRESSION/DISCECTOMY FUSION, ALLOGRAFT, PLATE;  Surgeon: Eldred Manges, MD;  Location: [facility] OR;  Service: Orthopedics;  Laterality: N/A;   HERNIA REPAIR     Right Inguinal   LUNG SURGERY  2009, 2007, 2005   spontaneous pneumothorax x 3 surgeries    Review of Systems  Constitutional:  Negative for chills, fever, malaise/fatigue and weight loss.  HENT:  Negative for congestion, ear pain, hearing loss, sore throat and tinnitus.   Eyes:  Negative for blurred vision, pain and redness.  Respiratory:  Negative for cough, hemoptysis and shortness of breath.   Cardiovascular:  Negative for chest pain, palpitations, orthopnea, claudication and leg swelling.  Gastrointestinal:  Negative for abdominal pain, blood in stool, constipation, diarrhea, nausea and vomiting.  Genitourinary:  Negative for dysuria, flank pain, frequency, hematuria and urgency.  Musculoskeletal:  Positive for back pain. Negative for falls, joint pain and myalgias.  Skin:  Negative for itching and rash.  Neurological:  Negative for dizziness, tingling, speech change, weakness and headaches.  Endo/Heme/Allergies:  Negative for polydipsia. Does not bruise/bleed easily.  Psychiatric/Behavioral:  Negative for depression and memory loss. The patient is not nervous/anxious and does not have insomnia.        Objective:  BP 122/82   Pulse 81   Ht 5\' 6"  (1.676 m)   Wt 175 lb 3.2 oz (79.5 kg)   SpO2 99%   BMI 28.28 kg/m   General appearance: alert, no distress, WD/WN, Caucasian male Skin: unremarkable HEENT: normocephalic, conjunctiva/corneas normal, sclerae anicteric, PERRLA, EOMi, nares patent, no discharge or erythema, pharynx normal Oral cavity: MMM, tongue normal, teeth in good repair Neck: supple, no lymphadenopathy, no thyromegaly, no masses, normal ROM, no bruits Chest: non tender, normal shape and expansion Heart: RRR, normal S1, S2, no murmurs Lungs: CTA bilaterally, no wheezes, rhonchi, or  rales Abdomen: +bs, soft, non tender, non distended, no masses, no hepatomegaly, no splenomegaly, no bruits Back: limited ROM due to pain Musculoskeletal: upper extremities non tender, no obvious deformity, normal ROM throughout, lower extremities non tender, no obvious deformity, normal ROM throughout Extremities: no edema, no cyanosis, no clubbing Pulses: 2+ symmetric, upper and lower extremities, normal cap refill Neurological: alert, oriented x 3, CN2-12 intact, strength normal upper extremities and lower extremities, sensation normal throughout, DTRs 2+ throughout, no cerebellar signs, gait normal Psychiatric: normal affect, behavior normal, pleasant  GU: declined  Diabetic Foot Exam - Simple   Simple Foot Form Diabetic Foot exam was performed with the following findings: Yes 06/09/2023  9:04 AM  Visual Inspection No deformities, no ulcerations, no other skin breakdown bilaterally: Yes Sensation Testing Intact  to touch and monofilament testing bilaterally: Yes Pulse Check Posterior Tibialis and Dorsalis pulse intact bilaterally: Yes Comments       Assessment and Plan :   Encounter Diagnoses  Name Primary?   Encounter for health maintenance examination in adult Yes   Screening for colon cancer    Screening for hematuria or proteinuria    Type II diabetes mellitus with complication (HCC)    Chronic bilateral low back pain, unspecified whether sciatica present    Essential hypertension, benign    Hyperlipidemia, unspecified hyperlipidemia type     This visit was a preventative care visit, also known as wellness visit or routine physical.   Topics typically include healthy lifestyle, diet, exercise, preventative care, vaccinations, sick and well care, proper use of emergency dept and after hours care, as well as other concerns.     Separate significant issues discussed: Diabetes - updated labs today See your eye doctor yearly for diabetes eye care Continue metformin 500mg   twice daily Check feet daily for sores/wounds  HTN - continue Lisinopril 10mg  daily  Hyperlipidemia -labs today, continue Crestor 20mg  daily  Continue with orthopedics and Pain management    General Recommendations: Continue to return yearly for your annual wellness and preventative care visits.  This gives [place] a chance to discuss healthy lifestyle, exercise, vaccinations, review your chart record, and perform screenings where appropriate.  I recommend you see your eye doctor yearly for routine vision care.  I recommend you see your dentist yearly for routine dental care including hygiene visits twice yearly.   Vaccination  Immunization History  Administered Date(s) Administered   Influenza, Seasonal, Injecte, Preservative Fre 04/16/2023   Influenza,inj,Quad PF,6+ Mos 05/28/2021, 06/01/2022   Influenza-Unspecified 02/25/2018   PFIZER(Purple Top)SARS-COV-2 Vaccination 03/10/2020, 03/31/2020   Tdap 01/08/2016     Screening for cancer: Colon cancer screening: Due as of 06/2023 for colonoscopy.  He will call back in first quarter 2025 to let me know when ready to be referred  Testicular cancer screening You should do a monthly self testicular exam if you are between 55-44 years old, and we typically do a testicular exam on the yearly physical for this same age group.   Prostate Cancer screening: The recommended prostate cancer screening test is a blood test called the prostate-specific antigen (PSA) test. PSA is a protein that is made in the prostate. As you age, your prostate naturally produces more PSA. Abnormally high PSA levels may be caused by: Prostate cancer. An enlarged prostate that is not caused by cancer (benign prostatic hyperplasia, or BPH). This condition is very common in older men. A prostate gland infection (prostatitis) or urinary tract infection. Certain medicines such as male hormones (like testosterone) or other medicines that raise testosterone levels. A  rectal exam may be done as part of prostate cancer screening to help provide information about the size of your prostate gland. When a rectal exam is performed, it should be done after the PSA level is drawn to avoid any effect on the results.   Skin cancer screening: Check your skin regularly for new changes, growing lesions, or other lesions of concern Come in for evaluation if you have skin lesions of concern.   Lung cancer screening: If you have a greater than 20 pack year history of tobacco use, then you may qualify for lung cancer screening with a chest CT scan.   Please call your insurance company to inquire about coverage for this test.   Pancreatic cancer:  no current  screening test is available or routinely recommended. (risk factors: smoking, overweight or obese, diabetes, chronic pancreatitis, work exposure - dry cleaning, metal working, 45yo>, M>F, Tree surgeon, family hx/o, hereditary breast, ovarian, melanoma, lynch, peutz-jeghers).  Symptoms: jaundice, dark urine, light color or greasy stools, itchy skin, belly or back pain, weight loss, poor appetite, nausea, vomiting, liver enlargement, DVT/blood clots.   We currently don't have screenings for other cancers besides breast, cervical, colon, and lung cancers.  If you have a strong family history of cancer or have other cancer screening concerns, please let me know.  Genetic testing referral is an option for individuals with high cancer risk in the family.  There are some other cancer screenings in development currently.   Bone health: Get at least 150 minutes of aerobic exercise weekly Get weight bearing exercise at least once weekly Bone density test:  A bone density test is an imaging test that uses a type of X-ray to measure the amount of calcium and other minerals in your bones. The test may be used to diagnose or screen you for a condition that causes weak or thin bones (osteoporosis), predict your risk for a broken  bone (fracture), or determine how well your osteoporosis treatment is working. The bone density test is recommended for females 65 and older, or females or males <65 if certain risk factors such as thyroid disease, long term use of steroids such as for asthma or rheumatological issues, vitamin D deficiency, estrogen deficiency, family history of osteoporosis, self or family history of fragility fracture in first degree relative.    Heart health: Get at least 150 minutes of aerobic exercise weekly Limit alcohol It is important to maintain a healthy blood pressure and healthy cholesterol numbers  Heart disease screening: Screening for heart disease includes screening for blood pressure, fasting lipids, glucose/diabetes screening, BMI height to weight ratio, reviewed of smoking status, physical activity, and diet.    Goals include blood pressure 120/80 or less, maintaining a healthy lipid/cholesterol profile, preventing diabetes or keeping diabetes numbers under good control, not smoking or using tobacco products, exercising most days per week or at least 150 minutes per week of exercise, and eating healthy variety of fruits and vegetables, healthy oils, and avoiding unhealthy food choices like fried food, fast food, high sugar and high cholesterol foods.    Other tests may possibly include EKG test, CT coronary calcium score, echocardiogram, exercise treadmill stress test.     CT coronary calcium score of zero 06/2021.   Vascular disease screening: For higher risk individuals including smokers, diabetics, patients with known heart disease or high blood pressure, kidney disease, and others, screening for vascular disease or atherosclerosis of the arteries is available.  Examples may include carotid ultrasound, abdominal aortic ultrasound, ABI blood flow screening in the legs, thoracic aorta screening.   Medical care options: I recommend you continue to seek care here first for routine care.  We  try really hard to have available appointments Monday through Friday daytime hours for sick visits, acute visits, and physicals.  Urgent care should be used for after hours and weekends for significant issues that cannot wait till the next day.  The emergency department should be used for significant potentially life-threatening emergencies.  The emergency department is expensive, can often have long wait times for less significant concerns, so try to utilize primary care, urgent care, or telemedicine when possible to avoid unnecessary trips to the emergency department.  Virtual visits and telemedicine have been introduced since  the pandemic started in 2020, and can be convenient ways to receive medical care.  We offer virtual appointments as well to assist you in a variety of options to seek medical care.   Legal  Take the time to do a last will and testament, Advanced Directives including Health Care Power of Attorney and Living Will documents.  Don't leave your family with burdens that can be handled ahead of time.   Advanced Directives: I recommend you consider completing a Health Care Power of Attorney and Living Will.   These documents respect your wishes and help alleviate burdens on your loved ones if you were to become terminally ill or be in a position to need those documents enforced.    You can complete Advanced Directives yourself, have them notarized, then have copies made for our office, for you and for anybody you feel should have them in safe keeping.  Or, you can have an attorney prepare these documents.   If you haven't updated your Last Will and Testament in a while, it may be worthwhile having an attorney prepare these documents together and save on some costs.       Spiritual and Emotional Health Keeping a healthy spiritual life can help you better manage your physical health. Your spiritual life can help you to cope with any issues that may arise with your physical health.   Balance can keep [place] healthy and help [place] to recover.  If you are struggling with your spiritual health there are questions that you may want to ask yourself:  What makes me feel most complete? When do I feel most connected to the rest of the world? Where do I find the most inner strength? What am I doing when I feel whole?  Helpful tips: Being in nature. Some people feel very connected and at peace when they are walking outdoors or are outside. Helping others. Some feel the largest sense of wellbeing when they are of service to others. Being of service can take on many forms. It can be doing volunteer work, being kind to strangers, or offering a hand to a friend in need. Gratitude. Some people find they feel the most connected when they remain grateful. They may make lists of all the things they are grateful for or say a thank you out loud for all they have.    Emotional Health Are you in tune with your emotional health?  Check out this link: http://www.marquez-love.com/    Financial Health Make sure you use a budget for your personal finances Make sure you are insured against risks (health insurance, life insurance, auto insurance, etc) Save more, spend less Set financial goals If you need help in this area, good resources include counseling through [employer] or other community resources, have a meeting with a Social research officer, government, and a good resource is the [employer]    [place] was seen today for annual exam.  Diagnoses and all orders for this visit:  Encounter for health maintenance examination in adult -     Comprehensive metabolic panel -     CBC -     Lipid panel -     Microalbumin/Creatinine Ratio, Urine -     Hemoglobin A1c  Screening for colon cancer  Screening for hematuria or proteinuria -     POCT Urinalysis DIP (Proadvantage Device)  Type II diabetes mellitus with complication (HCC) -     Microalbumin/Creatinine Ratio, Urine -      Hemoglobin A1c  Chronic bilateral low back pain, unspecified whether sciatica present  Essential hypertension, benign  Hyperlipidemia, unspecified hyperlipidemia type -     Lipid panel    Follow-up pending labs, yearly for physical

## 2023-06-10 ENCOUNTER — Other Ambulatory Visit: Payer: Self-pay | Admitting: Medical

## 2023-06-10 LAB — COMPREHENSIVE METABOLIC PANEL
ALT: 30 [IU]/L (ref 0–44)
AST: 21 [IU]/L (ref 0–40)
Albumin: 4.6 g/dL (ref 4.1–5.1)
Alkaline Phosphatase: 65 [IU]/L (ref 44–121)
BUN/Creatinine Ratio: 13 (ref 9–20)
BUN: 11 mg/dL (ref 6–24)
Bilirubin Total: 0.3 mg/dL (ref 0.0–1.2)
CO2: 22 mmol/L (ref 20–29)
Calcium: 9.6 mg/dL (ref 8.7–10.2)
Chloride: 105 mmol/L (ref 96–106)
Creatinine, Ser: 0.86 mg/dL (ref 0.76–1.27)
Globulin, Total: 1.8 g/dL (ref 1.5–4.5)
Glucose: 112 mg/dL — ABNORMAL HIGH (ref 70–99)
Potassium: 4.5 mmol/L (ref 3.5–5.2)
Sodium: 141 mmol/L (ref 134–144)
Total Protein: 6.4 g/dL (ref 6.0–8.5)
eGFR: 109 mL/min/{1.73_m2} (ref >59)

## 2023-06-10 LAB — CBC
Hematocrit: 46.2 % (ref 37.5–51.0)
Hemoglobin: 15.4 g/dL (ref 13.0–17.7)
MCH: 29.8 pg (ref 26.6–33.0)
MCHC: 33.3 g/dL (ref 31.5–35.7)
MCV: 89 fL (ref 79–97)
Platelets: 317 10*3/uL (ref 150–450)
RBC: 5.17 x10E6/uL (ref 4.14–5.80)
RDW: 12.4 % (ref 11.6–15.4)
WBC: 5.1 10*3/uL (ref 3.4–10.8)

## 2023-06-10 LAB — HEMOGLOBIN A1C
Est. average glucose Bld gHb Est-mCnc: 120 mg/dL
Hgb A1c MFr Bld: 5.8 % — ABNORMAL HIGH (ref 4.8–5.6)

## 2023-06-10 LAB — LIPID PANEL
Chol/HDL Ratio: 2.8 {ratio} (ref 0.0–5.0)
Cholesterol, Total: 131 mg/dL (ref 100–199)
HDL: 46 mg/dL (ref >39)
LDL Chol Calc (NIH): 61 mg/dL (ref 0–99)
Triglycerides: 135 mg/dL (ref 0–149)
VLDL Cholesterol Cal: 24 mg/dL (ref 5–40)

## 2023-06-10 LAB — MICROALBUMIN / CREATININE URINE RATIO
Creatinine, Urine: 47.4 mg/dL
Microalb/Creat Ratio: <6 mg/g{creat} (ref 0–29)
Microalbumin, Urine: <3 ug/mL

## 2023-06-10 MED ORDER — ATORVASTATIN CALCIUM 40 MG PO TABS: 40.0000 mg | ORAL_TABLET | Freq: Every day | ORAL | 2 refills | Status: DC

## 2023-06-10 NOTE — Progress Notes (Signed)
Results sent through MyChart

## 2023-06-11 NOTE — Progress Notes (Signed)
Results sent through MyChart

## 2023-06-27 ENCOUNTER — Inpatient Hospital Stay: Admission: RE | Admit: 2023-06-27 | Payer: No Typology Code available for payment source | Source: Ambulatory Visit

## 2023-06-27 ENCOUNTER — Other Ambulatory Visit: Payer: No Typology Code available for payment source

## 2023-07-16 ENCOUNTER — Other Ambulatory Visit: Payer: Self-pay | Admitting: Medical

## 2023-07-24 ENCOUNTER — Other Ambulatory Visit: Payer: Self-pay | Admitting: Medical

## 2023-08-03 ENCOUNTER — Other Ambulatory Visit: Payer: Self-pay | Admitting: Medical

## 2023-08-05 ENCOUNTER — Ambulatory Visit
Admission: RE | Admit: 2023-08-05 | Discharge: 2023-08-05 | Disposition: A | Discharge status: Home / Self Care | Payer: No Typology Code available for payment source | Source: Ambulatory Visit | Attending: Neurological Surgery | Admitting: Neurological Surgery

## 2023-08-05 ENCOUNTER — Ambulatory Visit
Admission: RE | Admit: 2023-08-05 | Discharge: 2023-08-05 | Disposition: A | Discharge status: Home / Self Care | Payer: 59 | Source: Ambulatory Visit | Attending: Neurological Surgery | Admitting: Neurological Surgery

## 2023-08-05 DIAGNOSIS — M461 Sacroiliitis, not elsewhere classified: Secondary | ICD-10-CM

## 2023-08-05 MED ORDER — METHYLPREDNISOLONE ACETATE 40 MG/ML INJ SUSP (RADIOLOG
80.0000 mg | Freq: Once | INTRAMUSCULAR | Status: AC
  Administered 2023-08-05: 80 mg via INTRA_ARTICULAR

## 2023-09-12 DIAGNOSIS — E118 Type 2 diabetes mellitus with unspecified complications: Secondary | ICD-10-CM

## 2023-09-12 MED ORDER — BLOOD GLUCOSE MONITORING SUPPL DEVI: 0 refills | Status: AC

## 2023-09-12 MED ORDER — LANCET DEVICE MISC: 0 refills | Status: AC

## 2023-09-12 MED ORDER — BLOOD GLUCOSE TEST VI STRP: ORAL_STRIP | 0 refills | Status: AC

## 2023-09-12 MED ORDER — LANCETS MISC. MISC: 0 refills | Status: AC

## 2023-11-02 ENCOUNTER — Other Ambulatory Visit: Payer: Self-pay | Admitting: Medical

## 2023-11-02 NOTE — Telephone Encounter (Signed)
 Has an appt in Dec 2025

## 2023-12-10 ENCOUNTER — Ambulatory Visit (HOSPITAL_COMMUNITY): Admission: EM | Admit: 2023-12-10 | Discharge: 2023-12-10 | Disposition: A | Discharge status: Home / Self Care

## 2023-12-10 ENCOUNTER — Ambulatory Visit (INDEPENDENT_AMBULATORY_CARE_PROVIDER_SITE_OTHER)

## 2023-12-10 ENCOUNTER — Other Ambulatory Visit: Payer: Self-pay

## 2023-12-10 ENCOUNTER — Encounter (HOSPITAL_COMMUNITY): Payer: Self-pay | Admitting: Emergency Medicine

## 2023-12-10 DIAGNOSIS — J069 Acute upper respiratory infection, unspecified: Secondary | ICD-10-CM | POA: Diagnosis not present

## 2023-12-10 DIAGNOSIS — R051 Acute cough: Secondary | ICD-10-CM

## 2023-12-10 MED ORDER — AZITHROMYCIN 250 MG PO TABS: ORAL_TABLET | ORAL | 0 refills | Status: DC

## 2023-12-10 MED ORDER — PROMETHAZINE-DM 6.25-15 MG/5ML PO SYRP: 5.0000 mL | ORAL_SOLUTION | Freq: Every evening | ORAL | 0 refills | Status: DC | PRN

## 2023-12-10 MED ORDER — PREDNISONE 20 MG PO TABS: 40.0000 mg | ORAL_TABLET | Freq: Every day | ORAL | 0 refills | Status: AC

## 2023-12-10 MED ORDER — BENZONATATE 100 MG PO CAPS: 100.0000 mg | ORAL_CAPSULE | Freq: Three times a day (TID) | ORAL | 0 refills | Status: DC

## 2023-12-10 NOTE — ED Provider Notes (Signed)
 [facility]    CSN: [id number] Arrival date & time: 12/10/23  1517      History   Chief Complaint Chief Complaint  Patient presents with   Cough    HPI Cristian HARTWELL is a 43 y.o. male.   Patient presents with cough and congestion for approximately 1 week.  Patient states that when he coughs he notices a burning sensation in his lungs.  Patient states that today he was mowing the lawn and this made his coughing worse.  Patient states that he has heard some wheezing intermittently and has had some intermittent shortness of breath as well.  Denies chest pain, fever, body aches, chills, sore throat, nausea, vomiting, and abdominal pain.  Patient does have history of spontaneous pneumothorax that occurred in 2005, 2007, and 2009.  Patient states that he is missing approximately 10% of each lung because of this.  Patient reports that his daughter is currently sick with pneumonia.  The history is provided by the patient and medical records.  Cough   Past Medical History:  Diagnosis Date   Diabetes mellitus without complication (HCC) 2023   HNP (herniated nucleus pulposus), cervical    C4-5   Hyperlipidemia    Hypertension    Spontaneous pneumothorax    3 times, 2009, 2007, 2005    Patient Active Problem List   Diagnosis Date Noted   Bilateral arm numbness and tingling while sleeping 06/01/2022   Type II diabetes mellitus with complication (HCC) 08/12/2021   Hyperlipidemia 05/28/2021   Essential hypertension, benign 05/28/2021   Encounter for health maintenance examination in adult 05/28/2021   Chronic bilateral low back pain 05/28/2021   Bursitis of left knee 09/25/2020   Acute right-sided low back pain without sciatica 03/10/2020   High risk medication use 03/10/2020   Family history of premature CAD 11/15/2018   History of spinal stenosis 11/15/2018   Cervical spinal stenosis 11/23/2017   Cubital tunnel syndrome on right 10/05/2017   Chronic right  shoulder pain 08/16/2017    Past Surgical History:  Procedure Laterality Date   ANTERIOR CERVICAL DECOMP/DISCECTOMY FUSION N/A 11/23/2017   Procedure: C4-5 ANTERIOR CERVICAL DECOMPRESSION/DISCECTOMY FUSION, ALLOGRAFT, PLATE;  Surgeon: Barbarann Oneil BROCKS, MD;  Location: [facility] OR;  Service: Orthopedics;  Laterality: N/A;   HERNIA REPAIR     Right Inguinal   LUNG SURGERY  2009, 2007, 2005   spontaneous pneumothorax x 3 surgeries       Home Medications    Prior to Admission medications   Medication Sig Start Date End Date Taking? Authorizing Provider  azithromycin  (ZITHROMAX  Z-PAK) 250 MG tablet Take 2 pills (500mg ) first day and one pill (250mg ) the remaining 4 days. 12/10/23  Yes [name], Navi Ewton A, NP  benzonatate  (TESSALON ) 100 MG capsule Take 1 capsule (100 mg total) by mouth every 8 (eight) hours. 12/10/23  Yes [name] Flaming A, NP  oxyCODONE -acetaminophen  (PERCOCET/ROXICET) 5-325 MG tablet Take by mouth every 4 (four) hours as needed for severe pain (pain score 7-10).   Yes [provider]  predniSONE  (DELTASONE ) 20 MG tablet Take 2 tablets (40 mg total) by mouth daily for 5 days. 12/10/23 12/15/23 Yes [name] Flaming A, NP  promethazine -dextromethorphan (PROMETHAZINE -DM) 6.25-15 MG/5ML syrup Take 5 mLs by mouth at bedtime as needed for cough. 12/10/23  Yes [name], Elfego Giammarino A, NP  acetaminophen  (TYLENOL ) 500 MG tablet Take 1,000 mg by mouth every 6 (six) hours as needed.    [provider]  atorvastatin  (LIPITOR) 40 MG tablet Take  1 tablet (40 mg total) by mouth daily. 06/10/23   Tysinger, Alm RAMAN, PA-C  blood glucose meter kit and supplies Dispense based on patient and insurance preference. Test once a daily 07/31/21   Tysinger, Alm RAMAN, PA-C  Blood Glucose Monitoring Suppl DEVI Test 1-2 times day. Pend on Insurance 09/12/23   Tysinger, Alm RAMAN, PA-C  gabapentin  (NEURONTIN ) 300 MG capsule Take 300 mg by mouth daily. 07/03/21   [provider]  Glucose Blood (BLOOD  GLUCOSE TEST STRIPS) STRP Test 1-2 times daily 09/12/23   Tysinger, Alm RAMAN, PA-C  ibuprofen  (ADVIL ) 800 MG tablet Take 800 mg by mouth every 8 (eight) hours as needed.    [provider]  Lancet Device MISC 1-2 times daily 09/12/23   Tysinger, Alm RAMAN, PA-C  Lancets Misc. MISC 1-2 times a day 09/12/23   Tysinger, Alm RAMAN, PA-C  lidocaine  (HM LIDOCAINE  PATCH) 4 % Place 1 patch onto the skin daily.    [provider]  lisinopril  (ZESTRIL ) 10 MG tablet TAKE 1 TABLET BY MOUTH EVERY DAY 11/02/23   Tysinger, Alm RAMAN, PA-C  metFORMIN  (GLUCOPHAGE ) 500 MG tablet TAKE 1 TABLET BY MOUTH TWICE A DAY WITH FOOD 07/25/23   Tysinger, Alm RAMAN, PA-C    Family History Family History  Problem Relation Age of Onset   Hypertension Mother    Heart disease Father 72       stent   Alcohol abuse Father    Heart disease Paternal Uncle        CABG   Cancer Maternal Grandfather        lung, asbestos    Social History Social History   Tobacco Use   Smoking status: Former    Types: Cigarettes   Smokeless tobacco: Never  Vaping Use   Vaping status: Former  Substance Use Topics   Alcohol use: Yes    Comment: Occ   Drug use: No     Allergies   Patient has no known allergies.   Review of Systems Review of Systems  Respiratory:  Positive for cough.    Per HPI  Physical Exam Triage Vital Signs ED Triage Vitals  Encounter Vitals Group     BP 12/10/23 1613 (!) 127/90     Girls Systolic BP Percentile --      Girls Diastolic BP Percentile --      Boys Systolic BP Percentile --      Boys Diastolic BP Percentile --      Pulse Rate 12/10/23 1613 (!) 104     Resp 12/10/23 1613 18     Temp 12/10/23 1613 98.4 F (36.9 C)     Temp Source 12/10/23 1613 Oral     SpO2 12/10/23 1613 96 %     Weight --      Height --      Head Circumference --      Peak Flow --      Pain Score 12/10/23 1610 4     Pain Loc --      Pain Education --      Exclude from Growth Chart --    No data  found.  Updated Vital Signs BP (!) 127/90 (BP Location: Left Arm) Comment (BP Location): large cuff  Pulse (!) 104   Temp 98.4 F (36.9 C) (Oral)   Resp 18   SpO2 96%   Visual Acuity Right Eye Distance:   Left Eye Distance:   Bilateral Distance:    Right Eye Near:  Left Eye Near:    Bilateral Near:     Physical Exam Vitals and nursing note reviewed.  Constitutional:      General: He is awake. He is not in acute distress.    Appearance: Normal appearance. He is well-developed and well-groomed. He is not ill-appearing.  HENT:     Right Ear: Tympanic membrane, ear canal and external ear normal.     Left Ear: Tympanic membrane, ear canal and external ear normal.     Nose: Congestion and rhinorrhea present.     Mouth/Throat:     Mouth: Mucous membranes are moist.     Pharynx: Posterior oropharyngeal erythema and postnasal drip present. No oropharyngeal exudate.   Cardiovascular:     Rate and Rhythm: Normal rate and regular rhythm.  Pulmonary:     Effort: Pulmonary effort is normal.     Breath sounds: Normal breath sounds.   Skin:    General: Skin is warm and dry.   Neurological:     Mental Status: He is alert.   Psychiatric:        Behavior: Behavior is cooperative.      UC Treatments / Results  Labs (all labs ordered are listed, but only abnormal results are displayed) Labs Reviewed - No data to display  EKG   Radiology DG Chest 2 View Result Date: 12/10/2023 CLINICAL DATA:  Cough EXAM: CHEST - 2 VIEW COMPARISON:  07/01/2007 FINDINGS: Cardiac shadow is within normal limits. Postsurgical changes are noted in the apices bilaterally. No focal infiltrate or effusion is seen. No acute bony abnormality is noted. IMPRESSION: Postsurgical changes stable from the prior exam. No acute abnormality noted. Electronically Signed   By: Oneil Devonshire M.D.   On: 12/10/2023 18:13    Procedures Procedures (including critical care time)  Medications Ordered in  UC Medications - No data to display  Initial Impression / Assessment and Plan / UC Course  I have reviewed the triage vital signs and the nursing notes.  Pertinent labs & imaging results that were available during my care of the patient were reviewed by me and considered in my medical decision making (see chart for details).     Patient is overall well-appearing.  Vitals are stable.  Congestion and rhinorrhea are present, mild erythema and PND noted to pharynx.  Lungs clear bilaterally on auscultation.  Chest x-ray ordered.  Based on my interpretation there is some suspicion to the right lower lobe.  Radiology report suggested otherwise.  Treated patient with azithromycin  for atypical pneumonia.  Given prednisone  burst to help with inflammation of lungs causing shortness of breath and wheezing.  Prescribed Tessalon  and Promethazine  DM as needed for cough.  Discussed over-the-counter medication as needed for symptoms.  Discussed follow-up and return precautions Final Clinical Impressions(s) / UC Diagnoses   Final diagnoses:  Acute cough  Acute upper respiratory infection     Discharge Instructions      Based on my interpretation I believe your x-ray does show some suspicion for pneumonia.  Start taking azithromycin  by taking 2 tablets today and 1 tablet for the 4 remaining days. Take 2 tablets of prednisone  once daily for 5 days. You can take Tessalon  every 8 hours as needed for cough. Take Promethazine  DM cough syrup at bedtime as needed for cough.  This can make you drowsy so do not drive, work, or drink alcohol while taking this. Otherwise you can take over-the-counter Mucinex as needed for your cough and congestion. Return here if your  symptoms persist or worsen.     ED Prescriptions     Medication Sig Dispense Auth. Provider   predniSONE  (DELTASONE ) 20 MG tablet Take 2 tablets (40 mg total) by mouth daily for 5 days. 10 tablet [name] Flaming A, NP   benzonatate   (TESSALON ) 100 MG capsule Take 1 capsule (100 mg total) by mouth every 8 (eight) hours. 21 capsule [name] Flaming A, NP   promethazine -dextromethorphan (PROMETHAZINE -DM) 6.25-15 MG/5ML syrup Take 5 mLs by mouth at bedtime as needed for cough. 118 mL [name], Ivyana Locey A, NP   azithromycin  (ZITHROMAX  Z-PAK) 250 MG tablet Take 2 pills (500mg ) first day and one pill (250mg ) the remaining 4 days. 6 tablet [name] Flaming A, NP      PDMP not reviewed this encounter.   [name] Flaming A, NP 12/10/23 [phone]

## 2023-12-10 NOTE — ED Triage Notes (Signed)
 Cough started last week.  43 year old child has been diagnosed with pneumonia.    Patient reports chest burns when he coughs.    Patient mowed lawn prior to coming to department today.  Patient also reports spontaneous pneumothorax right and left  in 2005/2007  Has taken claritin, mucinex dm, claritin d.

## 2023-12-10 NOTE — Discharge Instructions (Addendum)
 Based on my interpretation I believe your x-ray does show some suspicion for pneumonia.  Start taking azithromycin  by taking 2 tablets today and 1 tablet for the 4 remaining days. Take 2 tablets of prednisone  once daily for 5 days. You can take Tessalon  every 8 hours as needed for cough. Take Promethazine  DM cough syrup at bedtime as needed for cough.  This can make you drowsy so do not drive, work, or drink alcohol while taking this. Otherwise you can take over-the-counter Mucinex as needed for your cough and congestion. Return here if your symptoms persist or worsen.

## 2023-12-12 ENCOUNTER — Ambulatory Visit (HOSPITAL_COMMUNITY): Payer: Self-pay

## 2023-12-15 ENCOUNTER — Encounter: Payer: Self-pay | Admitting: Medical

## 2023-12-15 ENCOUNTER — Ambulatory Visit (INDEPENDENT_AMBULATORY_CARE_PROVIDER_SITE_OTHER): Admitting: Medical

## 2023-12-15 VITALS — BP 114/68 | HR 81 | Ht 66.0 in | Wt 171.2 lb

## 2023-12-15 DIAGNOSIS — Z1211 Encounter for screening for malignant neoplasm of colon: Secondary | ICD-10-CM

## 2023-12-15 DIAGNOSIS — I10 Essential (primary) hypertension: Secondary | ICD-10-CM

## 2023-12-15 DIAGNOSIS — E118 Type 2 diabetes mellitus with unspecified complications: Secondary | ICD-10-CM

## 2023-12-15 DIAGNOSIS — J988 Other specified respiratory disorders: Secondary | ICD-10-CM

## 2023-12-15 DIAGNOSIS — E785 Hyperlipidemia, unspecified: Secondary | ICD-10-CM | POA: Diagnosis not present

## 2023-12-15 DIAGNOSIS — Z7185 Encounter for immunization safety counseling: Secondary | ICD-10-CM

## 2023-12-15 LAB — POCT GLYCOSYLATED HEMOGLOBIN (HGB A1C): Hemoglobin A1C: 5.8 % — AB (ref 4.0–5.6)

## 2023-12-15 MED ORDER — ATORVASTATIN CALCIUM 40 MG PO TABS: 40.0000 mg | ORAL_TABLET | Freq: Every day | ORAL | 2 refills | Status: DC

## 2023-12-15 MED ORDER — METFORMIN HCL 500 MG PO TABS: 500.0000 mg | ORAL_TABLET | Freq: Every day | ORAL | 1 refills | Status: DC

## 2023-12-15 NOTE — Progress Notes (Signed)
 Subjective:  Cristian Walker is a 43 y.o. male who presents for Chief Complaint  Patient presents with   Diabetes    Started taking 1 metformin  due to a couple of instance of almost blacking out and since he changed to 1 he hasn't had more issues.     Here for med check follow up.  Diabetes-he is prescribed metformin  500 mg twice a day but in the past few months he had a couple times where he felt lightheaded, so he quit taking twice daily and went to once daily metformin .  Since then he has not had any more issues with feeling that way.   Was having episodes of feeling shaky, lightheaded.  When he had one of the episodes, glucose was 58.  Checking glucose most mornings, typically 98-105.    Hypertension-compliant with lisinopril  10 mg daily.  Hyperlipidemia-compliant with atorvastatin  40 mg daily without complaint  Still dealing with chronic back pain, seeing neurosurgery, Dr Colon, having nerve block soon.  Was sick recently with cough, respiratory tract infection.  Daughter had pneumonai last week.  His sypmtoms began 1-2 weeks ago.  Has last day of zpak today.   Mostly improved.  No other aggravating or relieving factors.    No other c/o.  Past Medical History:  Diagnosis Date   Diabetes mellitus without complication (HCC) 2023   HNP (herniated nucleus pulposus), cervical    C4-5   Hyperlipidemia    Hypertension    Spontaneous pneumothorax    3 times, 2009, 2007, 2005   Current Outpatient Medications on File Prior to Visit  Medication Sig Dispense Refill   acetaminophen  (TYLENOL ) 500 MG tablet Take 1,000 mg by mouth every 6 (six) hours as needed.     azithromycin  (ZITHROMAX  Z-PAK) 250 MG tablet Take 2 pills (500mg ) first day and one pill (250mg ) the remaining 4 days. 6 tablet 0   benzonatate  (TESSALON ) 100 MG capsule Take 1 capsule (100 mg total) by mouth every 8 (eight) hours. 21 capsule 0   blood glucose meter kit and supplies Dispense based on patient and insurance  preference. Test once a daily 1 each 0   Blood Glucose Monitoring Suppl DEVI Test 1-2 times day. Pend on Insurance 1 each 0   gabapentin  (NEURONTIN ) 300 MG capsule Take 300 mg by mouth daily.     Glucose Blood (BLOOD GLUCOSE TEST STRIPS) STRP Test 1-2 times daily 100 strip 0   Lancet Device MISC 1-2 times daily 1 each 0   Lancets Misc. MISC 1-2 times a day 100 each 0   lisinopril  (ZESTRIL ) 10 MG tablet TAKE 1 TABLET BY MOUTH EVERY DAY 90 tablet 1   oxyCODONE -acetaminophen  (PERCOCET/ROXICET) 5-325 MG tablet Take by mouth every 4 (four) hours as needed for severe pain (pain score 7-10). (Patient taking differently: Take 1 tablet by mouth in the morning, at noon, and at bedtime.)     predniSONE  (DELTASONE ) 20 MG tablet Take 2 tablets (40 mg total) by mouth daily for 5 days. 10 tablet 0   promethazine -dextromethorphan (PROMETHAZINE -DM) 6.25-15 MG/5ML syrup Take 5 mLs by mouth at bedtime as needed for cough. 118 mL 0   No current facility-administered medications on file prior to visit.    The following portions of the patient's history were reviewed and updated as appropriate: allergies, current medications, past family history, past medical history, past social history, past surgical history and problem list.  ROS Otherwise as in subjective above    Objective: BP 114/68   Pulse  81   Ht 5' 6 (1.676 m)   Wt 171 lb 3.2 oz (77.7 kg)   SpO2 98%   BMI 27.63 kg/m   Wt Readings from Last 3 Encounters:  12/15/23 171 lb 3.2 oz (77.7 kg)  06/09/23 175 lb 3.2 oz (79.5 kg)  01/12/23 173 lb (78.5 kg)   General appearance: alert, no distress, well developed, well nourished HEENT: normocephalic, sclerae anicteric, mild conjunctiva erythema . moist, TMs pearly, nares patent, no discharge or erythema, pharynx normal Oral cavity: MMM, no lesions Neck: supple, no lymphadenopathy, no thyromegaly, no masses Heart: RRR, normal S1, S2, no murmurs Lungs: CTA bilaterally, no wheezes, rhonchi, or  rales Pulses: 2+ radial pulses, 2+ pedal pulses, normal cap refill Ext: no edema   Assessment: Encounter Diagnoses  Name Primary?   Type II diabetes mellitus with complication (HCC) Yes   Hyperlipidemia, unspecified hyperlipidemia type    Essential hypertension, benign    Screening for colon cancer    Respiratory tract infection    Vaccine counseling      Plan: Diabetes-hemoglobin A1c 5.8% today.  Continue glucose monitoring.  Discussed hypoglycemia and steps to take if he sees any more hypoglycemic episodes.  He already change his metformin  from twice a day down to once a day.  Continue metformin  only once a day.  We discussed the need to avoid hypoglycemia.  Hyperlipidemia-continue Lipitor 40 mg daily  Hypertension-continue lisinopril  10 mg daily  Respiratory tract infection-finish out the Z-Pak antibiotic, continue to rest and recuperate.  Vaccine counseling We discussed vaccine recommendations including Prevnar 20, COVID-vaccine, yearly flu shot.  Referral for Cologuard for screening for colon cancer.  See eye doctor soon for diabetic eye exam  Cristian Walker was seen today for diabetes.  Diagnoses and all orders for this visit:  Type II diabetes mellitus with complication (HCC) -     HgB A1c  Hyperlipidemia, unspecified hyperlipidemia type  Essential hypertension, benign  Screening for colon cancer -     Cologuard  Respiratory tract infection  Vaccine counseling  Other orders -     metFORMIN  (GLUCOPHAGE ) 500 MG tablet; Take 1 tablet (500 mg total) by mouth at bedtime. -     atorvastatin  (LIPITOR) 40 MG tablet; Take 1 tablet (40 mg total) by mouth daily.    Follow up: 05/2024 for well visit

## 2024-01-15 LAB — COLOGUARD: COLOGUARD: NEGATIVE

## 2024-01-16 ENCOUNTER — Ambulatory Visit: Payer: Self-pay | Admitting: Medical

## 2024-01-16 NOTE — Progress Notes (Signed)
 Results through my chart

## 2024-04-03 ENCOUNTER — Other Ambulatory Visit: Payer: Self-pay | Admitting: Neurological Surgery

## 2024-04-21 ENCOUNTER — Other Ambulatory Visit: Payer: Self-pay | Admitting: Medical

## 2024-04-23 NOTE — H&P (View-Only) (Signed)
 [facility] [place]  ASSESSMENT / PLAN: Cristian Walker is a 43 y.o. male with plans to undergo L5/S1 anterior lumbar interbody fusion on 05/10/24 with Dr. Colon. I have reviewed the patient's imaging and feel it is safe to approach this anteriorly. I discussed the specific risks associated with spine exposure including vascular injury, urethral injury, bowel injury, nervous injury. I explained that if severe vascular injury was encountered we may need to abandon the spinal procedure to safely repair the injury. I explained the typical postoperative course for anterior exposure of the spine, including small risk of postoperative ileus. The patient was understanding and wished to proceed.   CHIEF COMPLAINT: back pain with radiculopathy  HISTORY OF PRESENT ILLNESS: Cristian Walker is a 43 y.o. male Referred to clinic for evaluation of possible ALIF for L5/S1 disc disease.  Patient is a long standing history of back pain.  Majority of thevisit was spent reviewing the operative conduct of a ALIF procedure. He reports no major abdominal surgery.  He did have laparoscopic inguinal and umbilical hernia repairs  Past Medical History:  Diagnosis Date   Diabetes mellitus without complication (HCC) 2023   HNP (herniated nucleus pulposus), cervical    C4-5   Hyperlipidemia    Hypertension    Spontaneous pneumothorax    3 times, 2009, 2007, 2005    Past Surgical History:  Procedure Laterality Date   ANTERIOR CERVICAL DECOMP/DISCECTOMY FUSION N/A 11/23/2017   Procedure: C4-5 ANTERIOR CERVICAL DECOMPRESSION/DISCECTOMY FUSION, ALLOGRAFT, PLATE;  Walker: Cristian Oneil BROCKS, MD;  Location: [facility] OR;  Service: Orthopedics;  Laterality: N/A;   HERNIA REPAIR     Right Inguinal   LUNG SURGERY  2009, 2007, 2005   spontaneous pneumothorax x 3 surgeries    Family History  Problem Relation Age of Onset   Hypertension Mother    Heart disease Father 36       stent   Alcohol abuse Father     Heart disease Paternal Uncle        CABG   Cancer Maternal Grandfather        lung, asbestos    Social History   Socioeconomic History   Marital status: Married    Spouse name: Not on file   Number of children: Not on file   Years of education: Not on file   Highest education level: GED or equivalent  Occupational History   Not on file  Tobacco Use   Smoking status: Former    Types: Cigarettes   Smokeless tobacco: Never  Vaping Use   Vaping status: Former  Substance and Sexual Activity   Alcohol use: Yes    Comment: Occ   Drug use: No   Sexual activity: Yes    Partners: Female  Other Topics Concern   Not on file  Social History Narrative   Married, 36 year old child.   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 05/2023   Social Drivers of Health   Financial Resource Strain: Low Risk  (12/11/2023)   Overall Financial Resource Strain (CARDIA)    Difficulty of Paying Living Expenses: Not hard at all  Food Insecurity: No Food Insecurity (12/11/2023)   Hunger Vital Sign    Worried About Running Out of Food in the Last Year: Never true    Ran Out of Food in the Last Year: Never true  Transportation Needs: No Transportation Needs (12/11/2023)   PRAPARE - Transportation    Lack  of Transportation (Medical): No    Lack of Transportation (Non-Medical): No  Physical Activity: Inactive (12/11/2023)   Exercise Vital Sign    Days of Exercise per Week: 0 days    Minutes of Exercise per Session: Not on file  Stress: No Stress Concern Present (12/11/2023)   [employer] of Occupational Health - Occupational Stress Questionnaire    Feeling of Stress: Not at all  Social Connections: Socially Integrated (12/11/2023)   Social Connection and Isolation Panel    Frequency of Communication with Friends and Family: More than three times a week    Frequency of Social Gatherings with Friends and Family: Once a week    Attends Religious Services: More than 4  times per year    Active Member of [employer] or Organizations: Yes    Attends Banker Meetings: More than 4 times per year    Marital Status: Married  Catering Manager Violence: Unknown (09/24/2021)   Received from [facility]   HITS    Physically Hurt: Not on file    Insult or Talk Down To: Not on file    Threaten Physical Harm: Not on file    Scream or Curse: Not on file    No Known Allergies  Current Outpatient Medications  Medication Sig Dispense Refill   acetaminophen  (TYLENOL ) 500 MG tablet Take 1,000 mg by mouth every 6 (six) hours as needed.     atorvastatin  (LIPITOR) 40 MG tablet Take 1 tablet (40 mg total) by mouth daily. 90 tablet 2   azithromycin  (ZITHROMAX  Z-PAK) 250 MG tablet Take 2 pills (500mg ) first day and one pill (250mg ) the remaining 4 days. 6 tablet 0   benzonatate  (TESSALON ) 100 MG capsule Take 1 capsule (100 mg total) by mouth every 8 (eight) hours. 21 capsule 0   blood glucose meter kit and supplies Dispense based on patient and insurance preference. Test once a daily 1 each 0   Blood Glucose Monitoring Suppl DEVI Test 1-2 times day. Pend on Insurance 1 each 0   gabapentin  (NEURONTIN ) 300 MG capsule Take 300 mg by mouth daily.     Glucose Blood (BLOOD GLUCOSE TEST STRIPS) STRP Test 1-2 times daily 100 strip 0   Lancet Device MISC 1-2 times daily 1 each 0   Lancets Misc. MISC 1-2 times a day 100 each 0   lisinopril  (ZESTRIL ) 10 MG tablet TAKE 1 TABLET BY MOUTH EVERY DAY 90 tablet 0   metFORMIN  (GLUCOPHAGE ) 500 MG tablet Take 1 tablet (500 mg total) by mouth at bedtime. 90 tablet 1   oxyCODONE -acetaminophen  (PERCOCET/ROXICET) 5-325 MG tablet Take by mouth every 4 (four) hours as needed for severe pain (pain score 7-10). (Patient taking differently: Take 1 tablet by mouth in the morning, at noon, and at bedtime.)     promethazine -dextromethorphan (PROMETHAZINE -DM) 6.25-15 MG/5ML syrup Take 5 mLs by mouth at bedtime as needed for cough. 118 mL 0   No  current facility-administered medications for this visit.    PHYSICAL EXAM Vitals:   04/24/24 0845  BP: 120/84  Pulse: 92  Temp: 98.3 F (36.8 C)  SpO2: 97%  Weight: 169 lb (76.7 kg)  Height: 5' 6 (1.676 m)   No acute distress Regular rate and rhythm Unlabored breathing  PERTINENT LABORATORY AND RADIOLOGIC DATA  Most recent CBC    Latest Ref Rng & Units 06/09/2023    9:09 AM 06/01/2022    9:11 AM 05/28/2021    2:24 PM  CBC  WBC 3.4 -  10.8 x10E3/uL 5.1  5.0  5.3   Hemoglobin 13.0 - 17.7 g/dL 84.5  83.6  83.8   Hematocrit 37.5 - 51.0 % 46.2  47.9  46.7   Platelets 150 - 450 x10E3/uL 317  299  321      Most recent CMP    Latest Ref Rng & Units 06/09/2023    9:09 AM 06/01/2022    9:11 AM 07/08/2021    9:36 AM  CMP  Glucose 70 - 99 mg/dL 887  890  805   BUN 6 - 24 mg/dL 11  13    Creatinine 9.23 - 1.27 mg/dL 9.13  9.01    Sodium 865 - 144 mmol/L 141  141    Potassium 3.5 - 5.2 mmol/L 4.5  4.4    Chloride 96 - 106 mmol/L 105  101    CO2 20 - 29 mmol/L 22  23    Calcium  8.7 - 10.2 mg/dL 9.6  9.2    Total Protein 6.0 - 8.5 g/dL 6.4  7.0    Total Bilirubin 0.0 - 1.2 mg/dL 0.3  0.5    Alkaline Phos 44 - 121 IU/L 65  70    AST 0 - 40 IU/L 21  19    ALT 0 - 44 IU/L 30  26      Renal function CrCl cannot be calculated (Patient's most recent lab result is older than the maximum 21 days allowed.).  Hemoglobin A1C (%)  Date Value  12/15/2023 5.8 (A)   Hgb A1c MFr Bld (%)  Date Value  06/09/2023 5.8 (H)    LDL Chol Calc (NIH)  Date Value Ref Range Status  06/09/2023 61 0 - 99 mg/dL Final    MRI of lumbar spine personally reviewed.  Normal vascular anatomy.  L5/S1 disc base appears safe to approach anteriorly.   Debby SAILOR. Magda, MD [facility] Phone Number: [phone] 04/23/2024 4:28 PM   Total time spent on preparing this encounter including chart review, data review, collecting history, examining the  patient, and coordinating care: 60 minutes  Portions of this report may have been transcribed using voice recognition software.  Every effort has been made to ensure accuracy; however, inadvertent computerized transcription errors may still be present.

## 2024-04-23 NOTE — Progress Notes (Unsigned)
 [facility] [place]  ASSESSMENT / PLAN: Cristian Walker is a 43 y.o. male with plans to undergo L5/S1 anterior lumbar interbody fusion on 05/10/24 with Dr. Colon. I have reviewed the patient's imaging and feel it is safe to approach this anteriorly. I discussed the specific risks associated with spine exposure including vascular injury, urethral injury, bowel injury, nervous injury. I explained that if severe vascular injury was encountered we may need to abandon the spinal procedure to safely repair the injury. I explained the typical postoperative course for anterior exposure of the spine, including small risk of postoperative ileus. The patient was understanding and wished to proceed.   CHIEF COMPLAINT: back pain with radiculopathy  HISTORY OF PRESENT ILLNESS: Cristian Walker is a 43 y.o. male Referred to clinic for evaluation of possible ALIF for L5/S1 disc disease.  Patient is a long standing history of back pain.  Majority of thevisit was spent reviewing the operative conduct of a ALIF procedure. He reports no major abdominal surgery.  He did have laparoscopic inguinal and umbilical hernia repairs  Past Medical History:  Diagnosis Date   Diabetes mellitus without complication (HCC) 2023   HNP (herniated nucleus pulposus), cervical    C4-5   Hyperlipidemia    Hypertension    Spontaneous pneumothorax    3 times, 2009, 2007, 2005    Past Surgical History:  Procedure Laterality Date   ANTERIOR CERVICAL DECOMP/DISCECTOMY FUSION N/A 11/23/2017   Procedure: C4-5 ANTERIOR CERVICAL DECOMPRESSION/DISCECTOMY FUSION, ALLOGRAFT, PLATE;  Surgeon: Barbarann Oneil BROCKS, MD;  Location: [facility] OR;  Service: Orthopedics;  Laterality: N/A;   HERNIA REPAIR     Right Inguinal   LUNG SURGERY  2009, 2007, 2005   spontaneous pneumothorax x 3 surgeries    Family History  Problem Relation Age of Onset   Hypertension Mother    Heart disease Father 23       stent   Alcohol abuse Father     Heart disease Paternal Uncle        CABG   Cancer Maternal Grandfather        lung, asbestos    Social History   Socioeconomic History   Marital status: Married    Spouse name: Not on file   Number of children: Not on file   Years of education: Not on file   Highest education level: GED or equivalent  Occupational History   Not on file  Tobacco Use   Smoking status: Former    Types: Cigarettes   Smokeless tobacco: Never  Vaping Use   Vaping status: Former  Substance and Sexual Activity   Alcohol use: Yes    Comment: Occ   Drug use: No   Sexual activity: Yes    Partners: Female  Other Topics Concern   Not on file  Social History Narrative   Married, 62 year old child.   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 05/2023   Social Drivers of Health   Financial Resource Strain: Low Risk  (12/11/2023)   Overall Financial Resource Strain (CARDIA)    Difficulty of Paying Living Expenses: Not hard at all  Food Insecurity: No Food Insecurity (12/11/2023)   Hunger Vital Sign    Worried About Running Out of Food in the Last Year: Never true    Ran Out of Food in the Last Year: Never true  Transportation Needs: No Transportation Needs (12/11/2023)   PRAPARE - Transportation    Lack  of Transportation (Medical): No    Lack of Transportation (Non-Medical): No  Physical Activity: Inactive (12/11/2023)   Exercise Vital Sign    Days of Exercise per Week: 0 days    Minutes of Exercise per Session: Not on file  Stress: No Stress Concern Present (12/11/2023)   [employer] of Occupational Health - Occupational Stress Questionnaire    Feeling of Stress: Not at all  Social Connections: Socially Integrated (12/11/2023)   Social Connection and Isolation Panel    Frequency of Communication with Friends and Family: More than three times a week    Frequency of Social Gatherings with Friends and Family: Once a week    Attends Religious Services: More than 4  times per year    Active Member of [employer] or Organizations: Yes    Attends Banker Meetings: More than 4 times per year    Marital Status: Married  Catering Manager Violence: Unknown (09/24/2021)   Received from [facility]   HITS    Physically Hurt: Not on file    Insult or Talk Down To: Not on file    Threaten Physical Harm: Not on file    Scream or Curse: Not on file    No Known Allergies  Current Outpatient Medications  Medication Sig Dispense Refill   acetaminophen  (TYLENOL ) 500 MG tablet Take 1,000 mg by mouth every 6 (six) hours as needed.     atorvastatin  (LIPITOR) 40 MG tablet Take 1 tablet (40 mg total) by mouth daily. 90 tablet 2   azithromycin  (ZITHROMAX  Z-PAK) 250 MG tablet Take 2 pills (500mg ) first day and one pill (250mg ) the remaining 4 days. 6 tablet 0   benzonatate  (TESSALON ) 100 MG capsule Take 1 capsule (100 mg total) by mouth every 8 (eight) hours. 21 capsule 0   blood glucose meter kit and supplies Dispense based on patient and insurance preference. Test once a daily 1 each 0   Blood Glucose Monitoring Suppl DEVI Test 1-2 times day. Pend on Insurance 1 each 0   gabapentin  (NEURONTIN ) 300 MG capsule Take 300 mg by mouth daily.     Glucose Blood (BLOOD GLUCOSE TEST STRIPS) STRP Test 1-2 times daily 100 strip 0   Lancet Device MISC 1-2 times daily 1 each 0   Lancets Misc. MISC 1-2 times a day 100 each 0   lisinopril  (ZESTRIL ) 10 MG tablet TAKE 1 TABLET BY MOUTH EVERY DAY 90 tablet 0   metFORMIN  (GLUCOPHAGE ) 500 MG tablet Take 1 tablet (500 mg total) by mouth at bedtime. 90 tablet 1   oxyCODONE -acetaminophen  (PERCOCET/ROXICET) 5-325 MG tablet Take by mouth every 4 (four) hours as needed for severe pain (pain score 7-10). (Patient taking differently: Take 1 tablet by mouth in the morning, at noon, and at bedtime.)     promethazine -dextromethorphan (PROMETHAZINE -DM) 6.25-15 MG/5ML syrup Take 5 mLs by mouth at bedtime as needed for cough. 118 mL 0   No  current facility-administered medications for this visit.    PHYSICAL EXAM Vitals:   04/24/24 0845  BP: 120/84  Pulse: 92  Temp: 98.3 F (36.8 C)  SpO2: 97%  Weight: 169 lb (76.7 kg)  Height: 5' 6 (1.676 m)   No acute distress Regular rate and rhythm Unlabored breathing  PERTINENT LABORATORY AND RADIOLOGIC DATA  Most recent CBC    Latest Ref Rng & Units 06/09/2023    9:09 AM 06/01/2022    9:11 AM 05/28/2021    2:24 PM  CBC  WBC 3.4 -  10.8 x10E3/uL 5.1  5.0  5.3   Hemoglobin 13.0 - 17.7 g/dL 84.5  83.6  83.8   Hematocrit 37.5 - 51.0 % 46.2  47.9  46.7   Platelets 150 - 450 x10E3/uL 317  299  321      Most recent CMP    Latest Ref Rng & Units 06/09/2023    9:09 AM 06/01/2022    9:11 AM 07/08/2021    9:36 AM  CMP  Glucose 70 - 99 mg/dL 887  890  805   BUN 6 - 24 mg/dL 11  13    Creatinine 9.23 - 1.27 mg/dL 9.13  9.01    Sodium 865 - 144 mmol/L 141  141    Potassium 3.5 - 5.2 mmol/L 4.5  4.4    Chloride 96 - 106 mmol/L 105  101    CO2 20 - 29 mmol/L 22  23    Calcium  8.7 - 10.2 mg/dL 9.6  9.2    Total Protein 6.0 - 8.5 g/dL 6.4  7.0    Total Bilirubin 0.0 - 1.2 mg/dL 0.3  0.5    Alkaline Phos 44 - 121 IU/L 65  70    AST 0 - 40 IU/L 21  19    ALT 0 - 44 IU/L 30  26      Renal function CrCl cannot be calculated (Patient's most recent lab result is older than the maximum 21 days allowed.).  Hemoglobin A1C (%)  Date Value  12/15/2023 5.8 (A)   Hgb A1c MFr Bld (%)  Date Value  06/09/2023 5.8 (H)    LDL Chol Calc (NIH)  Date Value Ref Range Status  06/09/2023 61 0 - 99 mg/dL Final    MRI of lumbar spine personally reviewed.  Normal vascular anatomy.  L5/S1 disc base appears safe to approach anteriorly.   Debby SAILOR. Magda, MD [facility] Phone Number: [phone] 04/23/2024 4:28 PM   Total time spent on preparing this encounter including chart review, data review, collecting history, examining the  patient, and coordinating care: 60 minutes  Portions of this report may have been transcribed using voice recognition software.  Every effort has been made to ensure accuracy; however, inadvertent computerized transcription errors may still be present.

## 2024-04-24 ENCOUNTER — Ambulatory Visit: Attending: Vascular Surgery | Admitting: Vascular Surgery

## 2024-04-24 ENCOUNTER — Other Ambulatory Visit: Payer: Self-pay

## 2024-04-24 ENCOUNTER — Encounter: Payer: Self-pay | Admitting: Vascular Surgery

## 2024-04-24 VITALS — BP 120/84 | HR 92 | Temp 98.3°F | Ht 66.0 in | Wt 169.0 lb

## 2024-04-24 DIAGNOSIS — M5410 Radiculopathy, site unspecified: Secondary | ICD-10-CM | POA: Diagnosis not present

## 2024-05-03 NOTE — Progress Notes (Signed)
 Surgical Instructions   Your procedure is scheduled on Thursday, November 20th, 2025. Report to [facility] Main Entrance A at 5:30 A.M., then check in with the Admitting office. Any questions or running late day of surgery: call [phone]  Questions prior to your surgery date: call [phone], Monday-Friday, 8am-4pm. If you experience any cold or flu symptoms such as cough, fever, chills, shortness of breath, etc. between now and your scheduled surgery, please notify us  at the above number.     Remember:  Do not eat or drink after midnight the night before your surgery    Take these medicines the morning of surgery with A SIP OF WATER: Gabapentin  (Neurontin )   May take these medicines IF NEEDED: Acetaminophen  (Tylenol )    One week prior to surgery, STOP taking any Aspirin (unless otherwise instructed by your surgeon) Aleve , Naproxen , Ibuprofen , Motrin , Advil , Goody's, BC's, all herbal medications, fish oil, and non-prescription vitamins.   WHAT DO I DO ABOUT MY DIABETES MEDICATION?   Do not take Metformin  (Glucophage ) on the morning of surgery.     HOW TO MANAGE YOUR DIABETES BEFORE AND AFTER SURGERY  Why is it important to control my blood sugar before and after surgery? Improving blood sugar levels before and after surgery helps healing and can limit problems. A way of improving blood sugar control is eating a healthy diet by:  Eating less sugar and carbohydrates  Increasing activity/exercise  Talking with your doctor about reaching your blood sugar goals High blood sugars (greater than 180 mg/dL) can raise your risk of infections and slow your recovery, so you will need to focus on controlling your diabetes during the weeks before surgery. Make sure that the doctor who takes care of your diabetes knows about your planned surgery including the date and location.  How do I manage my blood sugar before surgery? Check your blood sugar at least 4 times a day,  starting 2 days before surgery, to make sure that the level is not too high or low.  Check your blood sugar the morning of your surgery when you wake up and every 2 hours until you get to the Short Stay unit.  If your blood sugar is less than 70 mg/dL, you will need to treat for low blood sugar: Do not take insulin. Treat a low blood sugar (less than 70 mg/dL) with  cup of clear juice (cranberry or apple), 4 glucose tablets, OR glucose gel. Recheck blood sugar in 15 minutes after treatment (to make sure it is greater than 70 mg/dL). If your blood sugar is not greater than 70 mg/dL on recheck, call [phone] for further instructions. Report your blood sugar to the short stay nurse when you get to Short Stay.  If you are admitted to the hospital after surgery: Your blood sugar will be checked by the staff and you will probably be given insulin after surgery (instead of oral diabetes medicines) to make sure you have good blood sugar levels. The goal for blood sugar control after surgery is 80-180 mg/dL.                      Do NOT Smoke (Tobacco/Vaping) for 24 hours prior to your procedure.  If you use a CPAP at night, you may bring your mask/headgear for your overnight stay.   You will be asked to remove any contacts, glasses, piercing's, hearing aid's, dentures/partials prior to surgery. Please bring cases for these items if needed.    Patients  discharged the day of surgery will not be allowed to drive home, and someone needs to stay with them for 24 hours.  SURGICAL WAITING ROOM VISITATION Patients may have no more than 2 support people in the waiting area - these visitors may rotate.   Pre-op nurse will coordinate an appropriate time for 1 ADULT support person, who may not rotate, to accompany patient in pre-op.  Children under the age of 46 must have an adult with them who is not the patient and must remain in the main waiting area with an adult.  If the patient needs to stay at the  hospital during part of their recovery, the visitor guidelines for inpatient rooms apply.  Please refer to the [facility] website for the visitor guidelines for any additional information.   If you received a COVID test during your pre-op visit  it is requested that you wear a mask when out in public, stay away from anyone that may not be feeling well and notify your surgeon if you develop symptoms. If you have been in contact with anyone that has tested positive in the last 10 days please notify you surgeon.      Pre-operative 4 CHG Bathing Instructions   You can play a key role in reducing the risk of infection after surgery. Your skin needs to be as free of germs as possible. You can reduce the number of germs on your skin by washing with CHG (chlorhexidine  gluconate) soap before surgery. CHG is an antiseptic soap that kills germs and continues to kill germs even after washing.   DO NOT use if you have an allergy to chlorhexidine /CHG or antibacterial soaps. If your skin becomes reddened or irritated, stop using the CHG and notify one of our RNs at [phone].   Please shower with the CHG soap starting 4 days before surgery using the following schedule:     Please keep in mind the following:  DO NOT shave, including legs and underarms, starting the day of your first shower.   You may shave your face at any point before/day of surgery.  Place clean sheets on your bed the day you start using CHG soap. Use a clean washcloth (not used since being washed) for each shower. DO NOT sleep with pets once you start using the CHG.   CHG Shower Instructions:  Wash your face and private area with normal soap. If you choose to wash your hair, wash first with your normal shampoo.  After you use shampoo/soap, rinse your hair and body thoroughly to remove shampoo/soap residue.  Turn the water OFF and apply  bottle of CHG soap to a CLEAN washcloth.  Apply CHG soap ONLY FROM YOUR NECK DOWN TO YOUR  TOES (washing for 3-5 minutes)  DO NOT use CHG soap on face, private areas, open wounds, or sores.  Pay special attention to the area where your surgery is being performed.  If you are having back surgery, having someone wash your back for you may be helpful. Wait 2 minutes after CHG soap is applied, then you may rinse off the CHG soap.  Pat dry with a clean towel  Put on clean clothes/pajamas   If you choose to wear lotion, please use ONLY the CHG-compatible lotions that are listed below.  Additional instructions for the day of surgery:  If you choose, you may shower the morning of surgery with an antibacterial soap.  DO NOT APPLY any lotions, deodorants, cologne, or perfumes.   Do  not bring valuables to the hospital. [facility] is not responsible for any belongings/valuables. Do not wear nail polish, gel polish, artificial nails, or any other type of covering on natural nails (fingers and toes) Do not wear jewelry or makeup Put on clean/comfortable clothes.  Please brush your teeth.  Ask your nurse before applying any prescription medications to the skin.     CHG Compatible Lotions   Aveeno Moisturizing lotion  Cetaphil Moisturizing Cream  Cetaphil Moisturizing Lotion  Clairol Herbal Essence Moisturizing Lotion, Dry Skin  Clairol Herbal Essence Moisturizing Lotion, Extra Dry Skin  Clairol Herbal Essence Moisturizing Lotion, Normal Skin  Curel Age Defying Therapeutic Moisturizing Lotion with Alpha Hydroxy  Curel Extreme Care Body Lotion  Curel Soothing Hands Moisturizing Hand Lotion  Curel Therapeutic Moisturizing Cream, Fragrance-Free  Curel Therapeutic Moisturizing Lotion, Fragrance-Free  Curel Therapeutic Moisturizing Lotion, Original Formula  Eucerin Daily Replenishing Lotion  Eucerin Dry Skin Therapy Plus Alpha Hydroxy Crme  Eucerin Dry Skin Therapy Plus Alpha Hydroxy Lotion  Eucerin Original Crme  Eucerin Original Lotion  Eucerin Plus Crme Eucerin Plus Lotion   Eucerin TriLipid Replenishing Lotion  Keri Anti-Bacterial Hand Lotion  Keri Deep Conditioning Original Lotion Dry Skin Formula Softly Scented  Keri Deep Conditioning Original Lotion, Fragrance Free Sensitive Skin Formula  Keri Lotion Fast Absorbing Fragrance Free Sensitive Skin Formula  Keri Lotion Fast Absorbing Softly Scented Dry Skin Formula  Keri Original Lotion  Keri Skin Renewal Lotion Keri Silky Smooth Lotion  Keri Silky Smooth Sensitive Skin Lotion  Nivea Body Creamy Conditioning Oil  Nivea Body Extra Enriched Lotion  Nivea Body Original Lotion  Nivea Body Sheer Moisturizing Lotion Nivea Crme  Nivea Skin Firming Lotion  NutraDerm 30 Skin Lotion  NutraDerm Skin Lotion  NutraDerm Therapeutic Skin Cream  NutraDerm Therapeutic Skin Lotion  ProShield Protective Hand Cream  Provon moisturizing lotion  Please read over the following fact sheets that you were given.

## 2024-05-04 ENCOUNTER — Encounter (HOSPITAL_COMMUNITY)
Admission: RE | Admit: 2024-05-04 | Discharge: 2024-05-04 | Disposition: A | Discharge status: Home / Self Care | Source: Ambulatory Visit | Attending: Neurological Surgery | Admitting: Neurological Surgery

## 2024-05-04 ENCOUNTER — Encounter (HOSPITAL_COMMUNITY): Payer: Self-pay

## 2024-05-04 ENCOUNTER — Other Ambulatory Visit: Payer: Self-pay

## 2024-05-04 VITALS — BP 122/82 | HR 81 | Temp 98.3°F | Resp 18 | Ht 66.0 in | Wt 174.5 lb

## 2024-05-04 DIAGNOSIS — Z01812 Encounter for preprocedural laboratory examination: Secondary | ICD-10-CM | POA: Diagnosis present

## 2024-05-04 DIAGNOSIS — E118 Type 2 diabetes mellitus with unspecified complications: Secondary | ICD-10-CM | POA: Diagnosis not present

## 2024-05-04 DIAGNOSIS — I10 Essential (primary) hypertension: Secondary | ICD-10-CM | POA: Diagnosis not present

## 2024-05-04 DIAGNOSIS — Z01818 Encounter for other preprocedural examination: Secondary | ICD-10-CM

## 2024-05-04 LAB — CBC
HCT: 45.1 % (ref 39.0–52.0)
Hemoglobin: 15.4 g/dL (ref 13.0–17.0)
MCH: 29.8 pg (ref 26.0–34.0)
MCHC: 34.1 g/dL (ref 30.0–36.0)
MCV: 87.2 fL (ref 80.0–100.0)
Platelets: 301 K/uL (ref 150–400)
RBC: 5.17 MIL/uL (ref 4.22–5.81)
RDW: 11.8 % (ref 11.5–15.5)
WBC: 4.8 K/uL (ref 4.0–10.5)
nRBC: 0 % (ref 0.0–0.2)

## 2024-05-04 LAB — SURGICAL PCR SCREEN
MRSA, PCR: NEGATIVE
Staphylococcus aureus: NEGATIVE

## 2024-05-04 LAB — BASIC METABOLIC PANEL WITH GFR
Anion gap: 11 (ref 5–15)
BUN: 13 mg/dL (ref 6–20)
CO2: 25 mmol/L (ref 22–32)
Calcium: 8.8 mg/dL — ABNORMAL LOW (ref 8.9–10.3)
Chloride: 105 mmol/L (ref 98–111)
Creatinine, Ser: 0.87 mg/dL (ref 0.61–1.24)
GFR, Estimated: >60 mL/min (ref >60)
Glucose, Bld: 101 mg/dL — ABNORMAL HIGH (ref 70–99)
Potassium: 3.8 mmol/L (ref 3.5–5.1)
Sodium: 141 mmol/L (ref 135–145)

## 2024-05-04 LAB — HEMOGLOBIN A1C
Hgb A1c MFr Bld: 5.7 % — ABNORMAL HIGH (ref 4.8–5.6)
Mean Plasma Glucose: 116.89 mg/dL

## 2024-05-04 LAB — TYPE AND SCREEN
ABO/RH(D): O NEG
Antibody Screen: NEGATIVE

## 2024-05-04 LAB — GLUCOSE, CAPILLARY: Glucose-Capillary: 122 mg/dL — ABNORMAL HIGH (ref 70–99)

## 2024-05-04 NOTE — Progress Notes (Signed)
 PCP - Alm Gent PA-C Cardiologist - Dr. Quintin Dawes  PPM/ICD - n/a Device Orders -  Rep Notified -   Chest x-ray - 12/10/23 EKG - 09/2023 per pt @ [facility]--Records requested Stress Test - 09/2023 per pt @ [facility]--Records requested ECHO - 09/2023 per pt @ [facility]--Records requested Cardiac Cath - denies  Sleep Study - n/a CPAP -   Fasting Blood Sugar - 100 Checks Blood Sugar weekly  Last dose of GLP1 agonist-  n/a GLP1 instructions:   Blood Thinner Instructions: n/a Aspirin Instructions:  ERAS Protcol - NPO PRE-SURGERY Ensure or G2-   COVID TEST- n/a   Anesthesia review: Yes, pending cardiac records  Patient denies shortness of breath, fever, cough and chest pain at PAT appointment

## 2024-05-07 NOTE — Progress Notes (Signed)
 Anesthesia Chart Review:  Case: [id number] Date/Time: 05/10/24 0715   Procedures:      ANTERIOR LUMBAR FUSION 1 LEVEL - ALIF L5-S1 Interbody Fusion posterior supplemental fixation with pedicle screws, O-arm     APPLICATION OF O-ARM     LUMBAR PERCUTANEOUS PEDICLE SCREW 1 LEVEL     ABDOMINAL EXPOSURE   Anesthesia type: General   Diagnosis: Radiculopathy of lumbosacral region [M54.17]   Pre-op diagnosis: Radiculopathy, lumbosacral region   [facility] ROOM 21 / [facility]   Surgeons: Cristian Shove, MD; Magda Debby SAILOR, MD       DISCUSSION: Patient is a 43 year old male scheduled for the above procedure.  History includes former smoker, HTN, HLD, DM2, recurrent bilateral spontaneous pneumothorax (left ~ 11/2003; right ~ 01/18/2005 s/p chest tube; s/p left VATS for stapling of apical blebs and mechanical pleurodesis 07/30/2005 and on the right 09/03/2006 with redo on the right 09/20/2006), spinal surgery (C4-5 ACDF 11/23/2017).  He recently had cardiac testing by Dr. Asher with [facility]. It sound like he was referred for NSR, incomplete RBBB on 07/25/2023 EKG. He had a coronary calcium  score of 0 and 2023.  09/20/2023 TTE was considered normal.  09/29/2023 nuclear stress test showed no significant ischemia or scar, LVEF 59%. Per 10/10/2023 office visit with Dr. Licia, no clinical HF or unstable angina symptoms. One year follow-up planned.   Anesthesia team to evaluate on the day of surgery.    VS: BP 122/82   Pulse 81   Temp 36.8 C   Resp 18   Ht 5' 6 (1.676 m)   Wt 79.2 kg   SpO2 100%   BMI 28.17 kg/m   PROVIDERS: Tysinger, Alm RAMAN, PA-C is PCP at [facility]. Also had 03/23/2024 visit with Jerel Gee, NP with [facility] (scanned under Media tab).  Licia Cassis, MD is cardiologist [facility])   LABS: Labs reviewed: Acceptable for surgery. A1c 5.7%.  (all labs ordered are listed, but only abnormal results are displayed)  Labs Reviewed  GLUCOSE,  CAPILLARY - Abnormal; Notable for the following components:      Result Value   Glucose-Capillary 122 (*)    All other components within normal limits  HEMOGLOBIN A1C - Abnormal; Notable for the following components:   Hgb A1c MFr Bld 5.7 (*)    All other components within normal limits  BASIC METABOLIC PANEL WITH GFR - Abnormal; Notable for the following components:   Glucose, Bld 101 (*)    Calcium  8.8 (*)    All other components within normal limits  SURGICAL PCR SCREEN  CBC  TYPE AND SCREEN     IMAGES: MRI L-spine 02/08/2024 ([facility]/PACS): IMPRESSION: 1. Stable MRI appearance of the lumbar spine since last year. 2. Mild disc degeneration at L1-L2 and L5-S1, including a left paracentral annular fissure at the latter which might be a source for left S1 radiculitis. But no spinal stenosis or convincing neural compression.  CXR 12/10/2023: FINDINGS: Cardiac shadow is within normal limits. Postsurgical changes are noted in the apices bilaterally. No focal infiltrate or effusion is seen. No acute bony abnormality is noted. IMPRESSION: Postsurgical changes stable from the prior exam. No acute abnormality noted.   EKG: 09/29/2023 EKG (as part of stress test from [facility]; scanned under Media tab, AMB Correspondence): Normal sinus rhythm   CV: Nuclear stress test 09/29/2023 [facility]; scanned under Media tab, AMB Correspondence): Conclusions: Send resting ECG shows normal sinus rhythm.  The resting and stress ECG  shows normal ST segment; and no ventricular tachycardia, significant QRS prolongation or heart block.  Both the rest and stress images are within normal limits.  No significant reversible ischemia or fixed scar.  Gated left ventricular ejection fraction is normal at 59%.  Normal LV segmental wall motion.  Echo 09/20/2023 [facility]; scanned under Media tab, AMB Correspondence): Conclusions: 1.  Normal study: Normal cardiac chamber sizes and function;  normal valve anatomy and function; no pericardial effusion or intracardiac mass.  No intracardiac shunts by 2D and color-flow imaging.  Normal thoracic aorta and aortic arch.  CT Cardiac Calcium  Scoring 07/16/2021: IMPRESSION: Coronary calcium  score of 0.   Past Medical History:  Diagnosis Date   Diabetes mellitus without complication (HCC) 2023   HNP (herniated nucleus pulposus), cervical    C4-5   Hyperlipidemia    Hypertension    Spontaneous pneumothorax    3 times, 2009, 2007, 2005    Past Surgical History:  Procedure Laterality Date   ANTERIOR CERVICAL DECOMP/DISCECTOMY FUSION N/A 11/23/2017   Procedure: C4-5 ANTERIOR CERVICAL DECOMPRESSION/DISCECTOMY FUSION, ALLOGRAFT, PLATE;  Surgeon: Barbarann Oneil BROCKS, MD;  Location: [facility] OR;  Service: Orthopedics;  Laterality: N/A;   HERNIA REPAIR     Right Inguinal   LUNG SURGERY  2009, 2007, 2005   spontaneous pneumothorax x 3 surgeries   ULNAR NERVE DECOMPRESSION Right     MEDICATIONS:  acetaminophen  (TYLENOL ) 500 MG tablet   atorvastatin  (LIPITOR) 40 MG tablet   blood glucose meter kit and supplies   Blood Glucose Monitoring Suppl DEVI   gabapentin  (NEURONTIN ) 300 MG capsule   Glucose Blood (BLOOD GLUCOSE TEST STRIPS) STRP   Lancet Device MISC   Lancets Misc. MISC   Lidocaine  HCl (ICY HOT MAX LIDOCAINE  EX)   lisinopril  (ZESTRIL ) 10 MG tablet   metFORMIN  (GLUCOPHAGE ) 500 MG tablet   naproxen  sodium (ALEVE ) 220 MG tablet   No current facility-administered medications for this encounter.    Isaiah Ruder, PA-C Surgical Short Stay/Anesthesiology [facility] Phone [phone] [facility] Phone [phone] 05/07/2024 2:08 PM

## 2024-05-07 NOTE — Anesthesia Preprocedure Evaluation (Signed)
 Anesthesia Evaluation  Patient identified by MRN, date of birth, ID band Patient awake    Reviewed: Allergy & Precautions, NPO status , Patient's Chart, lab work & pertinent test results  History of Anesthesia Complications Negative for: history of anesthetic complications  Airway Mallampati: III  TM Distance: >3 FB Neck ROM: Full    Dental  (+) Dental Advisory Given, Teeth Intact   Pulmonary neg shortness of breath, neg sleep apnea, neg COPD, neg recent URI, former smoker   breath sounds clear to auscultation       Cardiovascular hypertension, Pt. on medications (-) angina (-) Past MI and (-) CHF  Rhythm:Regular     Neuro/Psych  Neuromuscular disease  negative psych ROS   GI/Hepatic negative GI ROS, Neg liver ROS,,,  Endo/Other  diabetes, Type 2, Oral Hypoglycemic Agents    Renal/GU negative Renal ROS     Musculoskeletal negative musculoskeletal ROS (+)    Abdominal   Peds  Hematology negative hematology ROS (+)   Anesthesia Other Findings   Reproductive/Obstetrics                              Anesthesia Physical Anesthesia Plan  ASA: 2  Anesthesia Plan: General   Post-op Pain Management: Ketamine IV*   Induction: Intravenous  PONV Risk Score and Plan: 3 and Ondansetron  and Dexamethasone   Airway Management Planned: Oral ETT  Additional Equipment: None  Intra-op Plan:   Post-operative Plan: Extubation in OR  Informed Consent: I have reviewed the patients History and Physical, chart, labs and discussed the procedure including the risks, benefits and alternatives for the proposed anesthesia with the patient or authorized representative who has indicated his/her understanding and acceptance.     Dental advisory given  Plan Discussed with: CRNA  Anesthesia Plan Comments: (PAT note written 05/07/2024 by Amorie Rentz, PA-C.  )         Anesthesia Quick  Evaluation

## 2024-05-10 ENCOUNTER — Inpatient Hospital Stay (HOSPITAL_COMMUNITY): Admitting: Anesthesiology

## 2024-05-10 ENCOUNTER — Inpatient Hospital Stay (HOSPITAL_COMMUNITY)

## 2024-05-10 ENCOUNTER — Inpatient Hospital Stay (HOSPITAL_COMMUNITY): Admitting: Vascular Surgery

## 2024-05-10 ENCOUNTER — Other Ambulatory Visit: Payer: Self-pay

## 2024-05-10 ENCOUNTER — Encounter (HOSPITAL_COMMUNITY): Admission: RE | Disposition: A | Payer: Self-pay | Source: Home / Self Care | Attending: Neurological Surgery

## 2024-05-10 ENCOUNTER — Inpatient Hospital Stay (HOSPITAL_COMMUNITY)
Admission: RE | Admit: 2024-05-10 | Discharge: 2024-05-11 | DRG: 451 | Disposition: A | Discharge status: Home / Self Care | Attending: Neurological Surgery | Admitting: Neurological Surgery

## 2024-05-10 ENCOUNTER — Encounter (HOSPITAL_COMMUNITY): Payer: Self-pay | Admitting: Neurological Surgery

## 2024-05-10 DIAGNOSIS — Z7984 Long term (current) use of oral hypoglycemic drugs: Secondary | ICD-10-CM | POA: Diagnosis not present

## 2024-05-10 DIAGNOSIS — Z87891 Personal history of nicotine dependence: Secondary | ICD-10-CM

## 2024-05-10 DIAGNOSIS — Z981 Arthrodesis status: Secondary | ICD-10-CM

## 2024-05-10 DIAGNOSIS — E785 Hyperlipidemia, unspecified: Secondary | ICD-10-CM | POA: Diagnosis present

## 2024-05-10 DIAGNOSIS — M5417 Radiculopathy, lumbosacral region: Secondary | ICD-10-CM | POA: Diagnosis present

## 2024-05-10 DIAGNOSIS — M5116 Intervertebral disc disorders with radiculopathy, lumbar region: Secondary | ICD-10-CM | POA: Diagnosis present

## 2024-05-10 DIAGNOSIS — M5416 Radiculopathy, lumbar region: Secondary | ICD-10-CM | POA: Diagnosis not present

## 2024-05-10 DIAGNOSIS — Z79899 Other long term (current) drug therapy: Secondary | ICD-10-CM | POA: Diagnosis not present

## 2024-05-10 DIAGNOSIS — Z801 Family history of malignant neoplasm of trachea, bronchus and lung: Secondary | ICD-10-CM

## 2024-05-10 DIAGNOSIS — E119 Type 2 diabetes mellitus without complications: Secondary | ICD-10-CM | POA: Diagnosis present

## 2024-05-10 DIAGNOSIS — Z91048 Other nonmedicinal substance allergy status: Secondary | ICD-10-CM | POA: Diagnosis not present

## 2024-05-10 DIAGNOSIS — M545 Low back pain, unspecified: Secondary | ICD-10-CM

## 2024-05-10 DIAGNOSIS — I10 Essential (primary) hypertension: Secondary | ICD-10-CM | POA: Diagnosis present

## 2024-05-10 DIAGNOSIS — E118 Type 2 diabetes mellitus with unspecified complications: Secondary | ICD-10-CM

## 2024-05-10 DIAGNOSIS — G8929 Other chronic pain: Secondary | ICD-10-CM | POA: Diagnosis present

## 2024-05-10 DIAGNOSIS — Z811 Family history of alcohol abuse and dependence: Secondary | ICD-10-CM | POA: Diagnosis not present

## 2024-05-10 DIAGNOSIS — M4726 Other spondylosis with radiculopathy, lumbar region: Secondary | ICD-10-CM | POA: Diagnosis present

## 2024-05-10 DIAGNOSIS — Z01818 Encounter for other preprocedural examination: Secondary | ICD-10-CM

## 2024-05-10 DIAGNOSIS — Z8249 Family history of ischemic heart disease and other diseases of the circulatory system: Secondary | ICD-10-CM

## 2024-05-10 HISTORY — PX: ANTERIOR LUMBAR FUSION: SHX1170

## 2024-05-10 HISTORY — PX: ABDOMINAL EXPOSURE: SHX5708

## 2024-05-10 HISTORY — PX: LUMBAR PERCUTANEOUS PEDICLE SCREW 1 LEVEL: SHX5560

## 2024-05-10 LAB — GLUCOSE, CAPILLARY
Glucose-Capillary: 129 mg/dL — ABNORMAL HIGH (ref 70–99)
Glucose-Capillary: 140 mg/dL — ABNORMAL HIGH (ref 70–99)
Glucose-Capillary: 139 mg/dL — ABNORMAL HIGH (ref 70–99)
Glucose-Capillary: 231 mg/dL — ABNORMAL HIGH (ref 70–99)

## 2024-05-10 SURGERY — ANTERIOR LUMBAR FUSION 1 LEVEL
Anesthesia: General

## 2024-05-10 MED ORDER — CEFAZOLIN SODIUM-DEXTROSE 2-4 GM/100ML-% IV SOLN
2.0000 g | Freq: Three times a day (TID) | INTRAVENOUS | Status: AC
  Administered 2024-05-10 – 2024-05-11 (×2): 2 g via INTRAVENOUS
  Filled 2024-05-10 (×2): qty 100

## 2024-05-10 MED ORDER — SENNA 8.6 MG PO TABS
1.0000 | ORAL_TABLET | Freq: Two times a day (BID) | ORAL | Status: DC
  Administered 2024-05-10 – 2024-05-11 (×2): 8.6 mg via ORAL
  Filled 2024-05-10 (×2): qty 1

## 2024-05-10 MED ORDER — CHLORHEXIDINE GLUCONATE CLOTH 2 % EX PADS: 6.0000 | MEDICATED_PAD | Freq: Once | CUTANEOUS | Status: DC

## 2024-05-10 MED ORDER — OXYCODONE-ACETAMINOPHEN 5-325 MG PO TABS
1.0000 | ORAL_TABLET | ORAL | Status: DC | PRN
  Administered 2024-05-10 – 2024-05-11 (×4): 2 via ORAL
  Filled 2024-05-10 (×4): qty 2

## 2024-05-10 MED ORDER — SODIUM CHLORIDE 0.9% FLUSH
3.0000 mL | Freq: Two times a day (BID) | INTRAVENOUS | Status: DC
  Administered 2024-05-10 – 2024-05-11 (×2): 3 mL via INTRAVENOUS

## 2024-05-10 MED ORDER — GABAPENTIN 300 MG PO CAPS
300.0000 mg | ORAL_CAPSULE | Freq: Two times a day (BID) | ORAL | Status: DC
  Administered 2024-05-10 – 2024-05-11 (×2): 300 mg via ORAL
  Filled 2024-05-10 (×2): qty 1

## 2024-05-10 MED ORDER — POLYETHYLENE GLYCOL 3350 17 G PO PACK
17.0000 g | PACK | Freq: Every day | ORAL | Status: DC | PRN
Start: 2024-05-10 — End: 2024-05-11

## 2024-05-10 MED ORDER — INSULIN ASPART 100 UNIT/ML IJ SOLN
0.0000 [IU] | Freq: Every day | INTRAMUSCULAR | Status: DC
  Administered 2024-05-10: 2 [IU] via SUBCUTANEOUS
  Filled 2024-05-10: qty 2

## 2024-05-10 MED ORDER — AMISULPRIDE (ANTIEMETIC) 5 MG/2ML IV SOLN
INTRAVENOUS | Status: AC
  Filled 2024-05-10: qty 4

## 2024-05-10 MED ORDER — LISINOPRIL 10 MG PO TABS: 10.0000 mg | ORAL_TABLET | Freq: Every day | ORAL | Status: DC

## 2024-05-10 MED ORDER — FENTANYL CITRATE (PF) 100 MCG/2ML IJ SOLN
INTRAMUSCULAR | Status: AC
  Filled 2024-05-10: qty 2

## 2024-05-10 MED ORDER — LACTATED RINGERS IV SOLN: INTRAVENOUS | Status: DC

## 2024-05-10 MED ORDER — OXYCODONE-ACETAMINOPHEN 5-325 MG PO TABS
1.0000 | ORAL_TABLET | Freq: Four times a day (QID) | ORAL | Status: DC | PRN
Start: 2024-05-10 — End: 2024-05-10
  Administered 2024-05-10: 2 via ORAL
  Filled 2024-05-10 (×2): qty 2

## 2024-05-10 MED ORDER — SUGAMMADEX SODIUM 200 MG/2ML IV SOLN: INTRAVENOUS | Status: DC | PRN

## 2024-05-10 MED ORDER — FENTANYL CITRATE (PF) 250 MCG/5ML IJ SOLN
INTRAMUSCULAR | Status: DC | PRN
  Administered 2024-05-10: 50 ug via INTRAVENOUS
  Administered 2024-05-10: 150 ug via INTRAVENOUS
  Administered 2024-05-10: 50 ug via INTRAVENOUS

## 2024-05-10 MED ORDER — HYDROMORPHONE HCL 1 MG/ML IJ SOLN
INTRAMUSCULAR | Status: AC
  Filled 2024-05-10: qty 0.5

## 2024-05-10 MED ORDER — EPHEDRINE 5 MG/ML INJ
INTRAVENOUS | Status: AC
  Filled 2024-05-10: qty 5

## 2024-05-10 MED ORDER — LIDOCAINE 2% (20 MG/ML) 5 ML SYRINGE
INTRAMUSCULAR | Status: DC | PRN
  Administered 2024-05-10: 60 mg via INTRAVENOUS

## 2024-05-10 MED ORDER — PROPOFOL 10 MG/ML IV BOLUS
INTRAVENOUS | Status: AC
  Filled 2024-05-10: qty 20

## 2024-05-10 MED ORDER — PROPOFOL 10 MG/ML IV BOLUS
INTRAVENOUS | Status: DC | PRN
  Administered 2024-05-10: 40 mg via INTRAVENOUS
  Administered 2024-05-10: 160 mg via INTRAVENOUS

## 2024-05-10 MED ORDER — ALUM & MAG HYDROXIDE-SIMETH 200-200-20 MG/5ML PO SUSP
30.0000 mL | Freq: Four times a day (QID) | ORAL | Status: DC | PRN
Start: 2024-05-10 — End: 2024-05-11

## 2024-05-10 MED ORDER — INSULIN ASPART 100 UNIT/ML IJ SOLN: 0.0000 [IU] | Freq: Three times a day (TID) | INTRAMUSCULAR | Status: DC

## 2024-05-10 MED ORDER — BUPIVACAINE HCL (PF) 0.5 % IJ SOLN
INTRAMUSCULAR | Status: AC
  Filled 2024-05-10: qty 30

## 2024-05-10 MED ORDER — LIDOCAINE-EPINEPHRINE 1 %-1:100000 IJ SOLN
INTRAMUSCULAR | Status: DC | PRN
  Administered 2024-05-10: 5 mL
  Administered 2024-05-10: 4.5 mL

## 2024-05-10 MED ORDER — ACETAMINOPHEN 325 MG PO TABS: 650.0000 mg | ORAL_TABLET | ORAL | Status: DC | PRN

## 2024-05-10 MED ORDER — ONDANSETRON HCL 4 MG/2ML IJ SOLN
INTRAMUSCULAR | Status: DC | PRN
  Administered 2024-05-10: 4 mg via INTRAVENOUS

## 2024-05-10 MED ORDER — LIDOCAINE 2% (20 MG/ML) 5 ML SYRINGE
INTRAMUSCULAR | Status: AC
  Filled 2024-05-10: qty 5

## 2024-05-10 MED ORDER — FENTANYL CITRATE (PF) 250 MCG/5ML IJ SOLN
INTRAMUSCULAR | Status: AC
  Filled 2024-05-10: qty 5

## 2024-05-10 MED ORDER — ONDANSETRON HCL 4 MG/2ML IJ SOLN
4.0000 mg | Freq: Four times a day (QID) | INTRAMUSCULAR | Status: DC | PRN
  Administered 2024-05-10: 4 mg via INTRAVENOUS
  Filled 2024-05-10: qty 2

## 2024-05-10 MED ORDER — MIDAZOLAM HCL 2 MG/2ML IJ SOLN
INTRAMUSCULAR | Status: AC
  Filled 2024-05-10: qty 2

## 2024-05-10 MED ORDER — ACETAMINOPHEN 650 MG RE SUPP: 650.0000 mg | RECTAL | Status: DC | PRN

## 2024-05-10 MED ORDER — ROCURONIUM BROMIDE 10 MG/ML (PF) SYRINGE
PREFILLED_SYRINGE | INTRAVENOUS | Status: DC | PRN
  Administered 2024-05-10 (×2): 5 mg via INTRAVENOUS
  Administered 2024-05-10: 10 mg via INTRAVENOUS
  Administered 2024-05-10: 50 mg via INTRAVENOUS

## 2024-05-10 MED ORDER — AMISULPRIDE (ANTIEMETIC) 5 MG/2ML IV SOLN
10.0000 mg | Freq: Once | INTRAVENOUS | Status: AC | PRN
  Administered 2024-05-10: 10 mg via INTRAVENOUS

## 2024-05-10 MED ORDER — EPHEDRINE SULFATE-NACL 50-0.9 MG/10ML-% IV SOSY
PREFILLED_SYRINGE | INTRAVENOUS | Status: DC | PRN
  Administered 2024-05-10: 7.5 mg via INTRAVENOUS
  Administered 2024-05-10 (×3): 5 mg via INTRAVENOUS

## 2024-05-10 MED ORDER — LIDOCAINE-EPINEPHRINE 1 %-1:100000 IJ SOLN
INTRAMUSCULAR | Status: AC
Start: 2024-05-10 — End: 2024-05-10
  Filled 2024-05-10: qty 1

## 2024-05-10 MED ORDER — PROPOFOL 500 MG/50ML IV EMUL
INTRAVENOUS | Status: DC | PRN
  Administered 2024-05-10: 70 ug/kg/min via INTRAVENOUS

## 2024-05-10 MED ORDER — THROMBIN 5000 UNITS EX SOLR
OROMUCOSAL | Status: DC | PRN
  Administered 2024-05-10: 5 mL via TOPICAL

## 2024-05-10 MED ORDER — MENTHOL 3 MG MT LOZG: 1.0000 | LOZENGE | OROMUCOSAL | Status: DC | PRN

## 2024-05-10 MED ORDER — 0.9 % SODIUM CHLORIDE (POUR BTL) OPTIME
TOPICAL | Status: DC | PRN
  Administered 2024-05-10 (×3): 1000 mL

## 2024-05-10 MED ORDER — SUGAMMADEX SODIUM 200 MG/2ML IV SOLN
INTRAVENOUS | Status: DC | PRN
  Administered 2024-05-10: 200 mg via INTRAVENOUS

## 2024-05-10 MED ORDER — ORAL CARE MOUTH RINSE
15.0000 mL | Freq: Once | OROMUCOSAL | Status: AC
  Administered 2024-05-10: 15 mL via OROMUCOSAL

## 2024-05-10 MED ORDER — ACETAMINOPHEN 10 MG/ML IV SOLN: 1000.0000 mg | Freq: Once | INTRAVENOUS | Status: DC | PRN

## 2024-05-10 MED ORDER — PHENOL 1.4 % MT LIQD
1.0000 | OROMUCOSAL | Status: DC | PRN
Start: 2024-05-10 — End: 2024-05-11

## 2024-05-10 MED ORDER — PHENYLEPHRINE 80 MCG/ML (10ML) SYRINGE FOR IV PUSH (FOR BLOOD PRESSURE SUPPORT)
PREFILLED_SYRINGE | INTRAVENOUS | Status: DC | PRN
Start: 2024-05-10 — End: 2024-05-10
  Administered 2024-05-10: 80 ug via INTRAVENOUS
  Administered 2024-05-10: 160 ug via INTRAVENOUS
  Administered 2024-05-10: 80 ug via INTRAVENOUS
  Administered 2024-05-10: 160 ug via INTRAVENOUS
  Administered 2024-05-10: 120 ug via INTRAVENOUS
  Administered 2024-05-10 (×3): 80 ug via INTRAVENOUS

## 2024-05-10 MED ORDER — BISACODYL 10 MG RE SUPP: 10.0000 mg | Freq: Every day | RECTAL | Status: DC | PRN

## 2024-05-10 MED ORDER — THROMBIN 20000 UNITS EX SOLR
CUTANEOUS | Status: AC
  Filled 2024-05-10: qty 20000

## 2024-05-10 MED ORDER — OXYCODONE HCL 5 MG PO TABS: 5.0000 mg | ORAL_TABLET | Freq: Once | ORAL | Status: DC | PRN

## 2024-05-10 MED ORDER — DEXAMETHASONE SOD PHOSPHATE PF 10 MG/ML IJ SOLN
INTRAMUSCULAR | Status: DC | PRN
  Administered 2024-05-10: 10 mg via INTRAVENOUS

## 2024-05-10 MED ORDER — METHOCARBAMOL 1000 MG/10ML IJ SOLN: 500.0000 mg | Freq: Four times a day (QID) | INTRAMUSCULAR | Status: DC | PRN

## 2024-05-10 MED ORDER — SODIUM CHLORIDE 0.9 % IV SOLN
0.1500 ug/kg/min | INTRAVENOUS | Status: AC
  Administered 2024-05-10: .1 ug/kg/min via INTRAVENOUS
  Filled 2024-05-10: qty 2000

## 2024-05-10 MED ORDER — THROMBIN 5000 UNITS EX KIT
PACK | CUTANEOUS | Status: AC
  Filled 2024-05-10: qty 1

## 2024-05-10 MED ORDER — SODIUM CHLORIDE 0.9 % IV SOLN
250.0000 mL | INTRAVENOUS | Status: DC
  Administered 2024-05-10: 250 mL via INTRAVENOUS

## 2024-05-10 MED ORDER — BUPIVACAINE HCL (PF) 0.5 % IJ SOLN
INTRAMUSCULAR | Status: DC | PRN
  Administered 2024-05-10: 5 mL
  Administered 2024-05-10: 4.5 mL

## 2024-05-10 MED ORDER — PHENYLEPHRINE 80 MCG/ML (10ML) SYRINGE FOR IV PUSH (FOR BLOOD PRESSURE SUPPORT)
PREFILLED_SYRINGE | INTRAVENOUS | Status: AC
  Filled 2024-05-10: qty 20

## 2024-05-10 MED ORDER — OXYCODONE HCL 5 MG/5ML PO SOLN: 5.0000 mg | Freq: Once | ORAL | Status: DC | PRN

## 2024-05-10 MED ORDER — ONDANSETRON HCL 4 MG PO TABS
4.0000 mg | ORAL_TABLET | Freq: Four times a day (QID) | ORAL | Status: DC | PRN
  Administered 2024-05-11 (×2): 4 mg via ORAL
  Filled 2024-05-10 (×2): qty 1

## 2024-05-10 MED ORDER — CEFAZOLIN SODIUM-DEXTROSE 2-4 GM/100ML-% IV SOLN
2.0000 g | INTRAVENOUS | Status: AC
  Administered 2024-05-10 (×2): 2 g via INTRAVENOUS
  Filled 2024-05-10: qty 100

## 2024-05-10 MED ORDER — METHOCARBAMOL 500 MG PO TABS
500.0000 mg | ORAL_TABLET | Freq: Four times a day (QID) | ORAL | Status: DC | PRN
Start: 2024-05-10 — End: 2024-05-11
  Administered 2024-05-10 – 2024-05-11 (×3): 500 mg via ORAL
  Filled 2024-05-10 (×3): qty 1

## 2024-05-10 MED ORDER — DOCUSATE SODIUM 100 MG PO CAPS
100.0000 mg | ORAL_CAPSULE | Freq: Two times a day (BID) | ORAL | Status: DC
  Administered 2024-05-10 – 2024-05-11 (×3): 100 mg via ORAL
  Filled 2024-05-10 (×3): qty 1

## 2024-05-10 MED ORDER — HYDROMORPHONE HCL 1 MG/ML IJ SOLN
INTRAMUSCULAR | Status: DC | PRN
  Administered 2024-05-10 (×4): .25 mg via INTRAVENOUS

## 2024-05-10 MED ORDER — MIDAZOLAM HCL (PF) 2 MG/2ML IJ SOLN
INTRAMUSCULAR | Status: DC | PRN
Start: 2024-05-10 — End: 2024-05-10
  Administered 2024-05-10: 2 mg via INTRAVENOUS

## 2024-05-10 MED ORDER — PHENYLEPHRINE HCL-NACL 20-0.9 MG/250ML-% IV SOLN
INTRAVENOUS | Status: DC | PRN
Start: 2024-05-10 — End: 2024-05-10
  Administered 2024-05-10: 20 ug/min via INTRAVENOUS

## 2024-05-10 MED ORDER — INSULIN ASPART 100 UNIT/ML IJ SOLN: 0.0000 [IU] | INTRAMUSCULAR | Status: DC | PRN

## 2024-05-10 MED ORDER — FLEET ENEMA RE ENEM: 1.0000 | ENEMA | Freq: Once | RECTAL | Status: DC | PRN

## 2024-05-10 MED ORDER — SODIUM CHLORIDE 0.9% FLUSH: 3.0000 mL | INTRAVENOUS | Status: DC | PRN

## 2024-05-10 MED ORDER — ATORVASTATIN CALCIUM 40 MG PO TABS
40.0000 mg | ORAL_TABLET | Freq: Every day | ORAL | Status: DC
  Administered 2024-05-10: 40 mg via ORAL
  Filled 2024-05-10: qty 1

## 2024-05-10 MED ORDER — PHENYLEPHRINE 80 MCG/ML (10ML) SYRINGE FOR IV PUSH (FOR BLOOD PRESSURE SUPPORT)
PREFILLED_SYRINGE | INTRAVENOUS | Status: AC
  Filled 2024-05-10: qty 10

## 2024-05-10 MED ORDER — KETAMINE HCL 50 MG/5ML IJ SOSY
PREFILLED_SYRINGE | INTRAMUSCULAR | Status: DC | PRN
  Administered 2024-05-10: 20 mg via INTRAVENOUS
  Administered 2024-05-10 (×2): 10 mg via INTRAVENOUS

## 2024-05-10 MED ORDER — METFORMIN HCL 500 MG PO TABS
500.0000 mg | ORAL_TABLET | Freq: Every day | ORAL | Status: DC
  Administered 2024-05-10: 500 mg via ORAL
  Filled 2024-05-10: qty 1

## 2024-05-10 MED ORDER — KETAMINE HCL 50 MG/5ML IJ SOSY
PREFILLED_SYRINGE | INTRAMUSCULAR | Status: AC
  Filled 2024-05-10: qty 5

## 2024-05-10 MED ORDER — ONDANSETRON HCL 4 MG/2ML IJ SOLN
INTRAMUSCULAR | Status: AC
  Filled 2024-05-10: qty 2

## 2024-05-10 MED ORDER — CHLORHEXIDINE GLUCONATE 0.12 % MT SOLN
15.0000 mL | Freq: Once | OROMUCOSAL | Status: AC
  Filled 2024-05-10: qty 15

## 2024-05-10 MED ORDER — LACTATED RINGERS IV SOLN: INTRAVENOUS | Status: DC | PRN

## 2024-05-10 MED ORDER — FENTANYL CITRATE (PF) 100 MCG/2ML IJ SOLN
25.0000 ug | INTRAMUSCULAR | Status: DC | PRN
  Administered 2024-05-10: 50 ug via INTRAVENOUS
  Administered 2024-05-10 (×2): 25 ug via INTRAVENOUS

## 2024-05-10 MED ORDER — HYDROMORPHONE HCL 1 MG/ML IJ SOLN
0.5000 mg | INTRAMUSCULAR | Status: DC | PRN
  Administered 2024-05-10: 0.5 mg via INTRAVENOUS
  Filled 2024-05-10: qty 0.5

## 2024-05-10 MED ORDER — PROPOFOL 1000 MG/100ML IV EMUL
INTRAVENOUS | Status: AC
  Filled 2024-05-10: qty 200

## 2024-05-10 SURGICAL SUPPLY — 88 items
BAG COUNTER SPONGE SURGICOUNT (BAG) ×3 IMPLANT
BASKET BONE COLLECTION (BASKET) IMPLANT
BENZOIN TINCTURE PRP APPL 2/3 (GAUZE/BANDAGES/DRESSINGS) ×1 IMPLANT
BOLT ALIF MODULUS 5X17.5 (Bolt) IMPLANT
BONE MATRIX OSTEOCEL PRO LRG (Bone Implant) IMPLANT
BUR 14 MATCH 3 (BUR) IMPLANT
BUR BARREL STRAIGHT FLUTE 4.0 (BURR) ×1 IMPLANT
BUR MATCHSTICK NEURO 3.0 LAGG (BURR) IMPLANT
BUR MR8 14 BALL 5 (BUR) IMPLANT
CANISTER SUCTION 3000ML PPV (SUCTIONS) ×1 IMPLANT
CLIP APPLIE 11 MED OPEN (CLIP) ×1 IMPLANT
CLIP LIGATING EXTRA MED SLVR (CLIP) ×1 IMPLANT
CLIP NEUROVISION LG (NEUROSURGERY SUPPLIES) IMPLANT
CNTNR URN SCR LID CUP LEK RST (MISCELLANEOUS) ×1 IMPLANT
COVER BACK TABLE 24X17X13 BIG (DRAPES) IMPLANT
COVER BACK TABLE 60X90IN (DRAPES) ×2 IMPLANT
DERMABOND ADVANCED .7 DNX12 (GAUZE/BANDAGES/DRESSINGS) ×3 IMPLANT
DRAPE C-ARM 42X72 X-RAY (DRAPES) ×2 IMPLANT
DRAPE C-ARMOR (DRAPES) ×2 IMPLANT
DRAPE LAPAROTOMY 100X72X124 (DRAPES) ×2 IMPLANT
DRAPE SHEET LG 3/4 BI-LAMINATE (DRAPES) ×4 IMPLANT
DRAPE SURG 17X23 STRL (DRAPES) ×1 IMPLANT
DRSG OPSITE POSTOP 4X6 (GAUZE/BANDAGES/DRESSINGS) IMPLANT
DRSG OPSITE POSTOP 4X8 (GAUZE/BANDAGES/DRESSINGS) IMPLANT
DURAPREP 26ML APPLICATOR (WOUND CARE) ×2 IMPLANT
ELECT NVM5 SURFACE MEP/EMG (ELECTRODE) IMPLANT
ELECTRODE BLDE 4.0 EZ CLN MEGD (MISCELLANEOUS) ×2 IMPLANT
ELECTRODE REM PT RTRN 9FT ADLT (ELECTROSURGICAL) ×2 IMPLANT
FEE COVERAGE SUPPORT O-ARM (MISCELLANEOUS) ×1 IMPLANT
FEE NCS SETUP TEAR DOWN NVM5 (MISCELLANEOUS) IMPLANT
GAUZE 4X4 16PLY ~~LOC~~+RFID DBL (SPONGE) IMPLANT
GAUZE SPONGE 4X4 12PLY STRL (GAUZE/BANDAGES/DRESSINGS) ×1 IMPLANT
GLOVE BIO SURGEON STRL SZ8 (GLOVE) ×3 IMPLANT
GLOVE BIOGEL PI IND STRL 8.5 (GLOVE) ×3 IMPLANT
GLOVE ECLIPSE 8.0 STRL XLNG CF (GLOVE) ×1 IMPLANT
GLOVE ECLIPSE 8.5 STRL (GLOVE) ×3 IMPLANT
GLOVE SS BIOGEL STRL SZ 7.5 (GLOVE) ×1 IMPLANT
GOWN STRL REUS W/ TWL LRG LVL3 (GOWN DISPOSABLE) ×1 IMPLANT
GOWN STRL REUS W/ TWL XL LVL3 (GOWN DISPOSABLE) ×4 IMPLANT
GOWN STRL REUS W/TWL 2XL LVL3 (GOWN DISPOSABLE) ×4 IMPLANT
HEMOSTAT POWDER KIT SURGIFOAM (HEMOSTASIS) ×1 IMPLANT
INSERT FOGARTY 61MM (MISCELLANEOUS) IMPLANT
INSERT FOGARTY SM (MISCELLANEOUS) IMPLANT
KIT BASIN OR (CUSTOM PROCEDURE TRAY) ×2 IMPLANT
KIT TURNOVER KIT B (KITS) ×2 IMPLANT
MARKER SKIN DUAL TIP RULER LAB (MISCELLANEOUS) ×1 IMPLANT
MARKER SPHERE PSV REFLC NDI (MISCELLANEOUS) ×5 IMPLANT
NDL HYPO 25X1 1.5 SAFETY (NEEDLE) ×1 IMPLANT
NDL SPNL 18GX3.5 QUINCKE PK (NEEDLE) IMPLANT
NEEDLE HYPO 25X1 1.5 SAFETY (NEEDLE) ×2 IMPLANT
NEEDLE SPNL 18GX3.5 QUINCKE PK (NEEDLE) ×1 IMPLANT
PACK LAMINECTOMY NEURO (CUSTOM PROCEDURE TRAY) ×2 IMPLANT
PAD ARMBOARD POSITIONER FOAM (MISCELLANEOUS) ×5 IMPLANT
PATTIES SURGICAL .5 X1 (DISPOSABLE) ×1 IMPLANT
PENCIL SMOKE EVACUATOR (MISCELLANEOUS) ×1 IMPLANT
PIN BONE FIX 100 (PIN) IMPLANT
ROD LORD RELINE 5.5X25 (Rod) IMPLANT
ROD LORD RELINE 5.5X30 (Rod) IMPLANT
SCREW LOCK RELINE 5.5 TULIP (Screw) IMPLANT
SCREW RELINE RED 6.5X45MM POLY (Screw) IMPLANT
SOLN 0.9% NACL POUR BTL 1000ML (IV SOLUTION) ×2 IMPLANT
SOLN STERILE WATER BTL 1000 ML (IV SOLUTION) ×2 IMPLANT
SPACER ALIF MOD 6X38X28 10D (Spacer) IMPLANT
SPIKE FLUID TRANSFER (MISCELLANEOUS) ×1 IMPLANT
SPONGE INTESTINAL PEANUT (DISPOSABLE) ×1 IMPLANT
SPONGE SURGIFOAM ABS GEL 100 (HEMOSTASIS) ×1 IMPLANT
SPONGE T-LAP 18X18 ~~LOC~~+RFID (SPONGE) ×1 IMPLANT
SPONGE T-LAP 4X18 ~~LOC~~+RFID (SPONGE) ×2 IMPLANT
STRIP CLOSURE SKIN 1/2X4 (GAUZE/BANDAGES/DRESSINGS) ×1 IMPLANT
SUT PDS AB 1 CTX 36 (SUTURE) ×2 IMPLANT
SUT PROLENE 4-0 RB1 .5 CRCL 36 (SUTURE) IMPLANT
SUT PROLENE 5 0 CC1 (SUTURE) IMPLANT
SUT PROLENE 6 0 BV (SUTURE) ×2 IMPLANT
SUT PROLENE 6 0 C 1 30 (SUTURE) IMPLANT
SUT SILK 0 TIES 10X30 (SUTURE) ×1 IMPLANT
SUT SILK 2 0 TIES 10X30 (SUTURE) ×1 IMPLANT
SUT SILK 2 0SH CR/8 30 (SUTURE) IMPLANT
SUT SILK 3 0 TIES 10X30 (SUTURE) ×1 IMPLANT
SUT SILK 3 0SH CR/8 30 (SUTURE) IMPLANT
SUT VIC AB 0 CT1 27XBRD ANBCTR (SUTURE) IMPLANT
SUT VIC AB 1 CT1 18XBRD ANBCTR (SUTURE) ×1 IMPLANT
SUT VIC AB 2-0 CP2 18 (SUTURE) ×1 IMPLANT
SUT VIC AB 3-0 SH 8-18 (SUTURE) ×2 IMPLANT
SUT VIC AB 4-0 RB1 18 (SUTURE) ×2 IMPLANT
TOWEL GREEN STERILE (TOWEL DISPOSABLE) ×2 IMPLANT
TOWEL GREEN STERILE FF (TOWEL DISPOSABLE) ×1 IMPLANT
TRAY FOLEY MTR SLVR 14FR STAT (SET/KITS/TRAYS/PACK) IMPLANT
TRAY FOLEY MTR SLVR 16FR STAT (SET/KITS/TRAYS/PACK) ×2 IMPLANT

## 2024-05-10 NOTE — Anesthesia Procedure Notes (Signed)
 Procedure Name: Intubation Date/Time: 05/10/2024 8:05 AM  Performed by: Doral Ventrella J, CRNAPre-anesthesia Checklist: Patient identified, Emergency Drugs available, Suction available and Patient being monitored Patient Re-evaluated:Patient Re-evaluated prior to induction Oxygen Delivery Method: Circle System Utilized Preoxygenation: Pre-oxygenation with 100% oxygen Induction Type: IV induction Ventilation: Mask ventilation without difficulty Laryngoscope Size: Miller and 3 Grade View: Grade I Tube type: Oral Tube size: 7.5 mm Number of attempts: 1 Airway Equipment and Method: Stylet and Oral airway Placement Confirmation: ETT inserted through vocal cords under direct vision, positive ETCO2 and breath sounds checked- equal and bilateral Secured at: 23 cm Tube secured with: Tape Dental Injury: Teeth and Oropharynx as per pre-operative assessment

## 2024-05-10 NOTE — Interval H&P Note (Signed)
 History and Physical Interval Note:  05/10/2024 7:15 AM  Cristian Walker  has presented today for surgery, with the diagnosis of Radiculopathy, lumbosacral region.  The various methods of treatment have been discussed with the patient and family. After consideration of risks, benefits and other options for treatment, the patient has consented to  Procedure(s) with comments: ANTERIOR LUMBAR FUSION 1 LEVEL (N/A) - ALIF L5-S1 Interbody Fusion posterior supplemental fixation with pedicle screws, O-arm APPLICATION OF O-ARM (N/A) LUMBAR PERCUTANEOUS PEDICLE SCREW 1 LEVEL (N/A) ABDOMINAL EXPOSURE (N/A) as a surgical intervention.  The patient's history has been reviewed, patient examined, no change in status, stable for surgery.  I have reviewed the patient's chart and labs.  Questions were answered to the patient's satisfaction.     Debby LOISE Robertson

## 2024-05-10 NOTE — H&P (Signed)
 Cristian Walker is an 43 y.o. male.   Chief Complaint: Back pain left buttock and leg pain HPI: Patient is a 43 year old individual who had back pain and left lumbar radicular pain that started back in 2021 he was moving some furniture at the time since that time he has been through extensive efforts at conservative care for well ruptured disc which seem to resolve itself however he developed some advanced disc degenerative changes at the L5-S1 space.  He has had a conservative treatments including a number of injections transforaminal injections would give him transient relief the pain that would last up to a couple weeks but the pain always recurred.  He was considered for SI joint disability and was considered for fusion however repeated exams did not consistently show that this was an SI joint problem.  Given the refractoriness of this problem and the localized findings with disc degeneration at L5-S1 I have advised that he is now admitted for an anterior decompression of L5-S1 with posterior fixation.  Past Medical History:  Diagnosis Date   Diabetes mellitus without complication (HCC) 2023   HNP (herniated nucleus pulposus), cervical    C4-5   Hyperlipidemia    Hypertension    Spontaneous pneumothorax    3 times, 2009, 2007, 2005    Past Surgical History:  Procedure Laterality Date   ANTERIOR CERVICAL DECOMP/DISCECTOMY FUSION N/A 11/23/2017   Procedure: C4-5 ANTERIOR CERVICAL DECOMPRESSION/DISCECTOMY FUSION, ALLOGRAFT, PLATE;  Surgeon: Barbarann Oneil BROCKS, MD;  Location: [facility] OR;  Service: Orthopedics;  Laterality: N/A;   HERNIA REPAIR     Right Inguinal   LUNG SURGERY  2009, 2007, 2005   spontaneous pneumothorax x 3 surgeries   ULNAR NERVE DECOMPRESSION Right     Family History  Problem Relation Age of Onset   Hypertension Mother    Heart disease Father 36       stent   Alcohol abuse Father    Heart disease Paternal Uncle        CABG   Cancer Maternal Grandfather        lung,  asbestos   Social History:  reports that he has quit smoking. His smoking use included cigarettes. He has never used smokeless tobacco. He reports current alcohol use. He reports that he does not use drugs.  Allergies:  Allergies  Allergen Reactions   Chlorhexidine  Gluconate [Chlorhexidine ] Itching    CHG soap made him red all over his body and very itchy after one shower.     Medications Prior to Admission  Medication Sig Dispense Refill   acetaminophen  (TYLENOL ) 500 MG tablet Take 500-1,000 mg by mouth every 6 (six) hours as needed (pain.).     atorvastatin  (LIPITOR) 40 MG tablet Take 1 tablet (40 mg total) by mouth daily. (Patient taking differently: Take 40 mg by mouth daily after supper.) 90 tablet 2   gabapentin  (NEURONTIN ) 300 MG capsule Take 300 mg by mouth in the morning and at bedtime.     Lidocaine  HCl (ICY HOT MAX LIDOCAINE  EX) Apply 1 Application topically 3 (three) times daily as needed (pain.).     lisinopril  (ZESTRIL ) 10 MG tablet TAKE 1 TABLET BY MOUTH EVERY DAY (Patient taking differently: Take 10 mg by mouth daily with lunch.) 90 tablet 0   metFORMIN  (GLUCOPHAGE ) 500 MG tablet Take 1 tablet (500 mg total) by mouth at bedtime. (Patient taking differently: Take 500 mg by mouth daily after supper.) 90 tablet 1   naproxen  sodium (ALEVE ) 220 MG tablet Take  220-440 mg by mouth 2 (two) times daily as needed (pain).     blood glucose meter kit and supplies Dispense based on patient and insurance preference. Test once a daily 1 each 0   Blood Glucose Monitoring Suppl DEVI Test 1-2 times day. Pend on Insurance 1 each 0   Glucose Blood (BLOOD GLUCOSE TEST STRIPS) STRP Test 1-2 times daily 100 strip 0   Lancet Device MISC 1-2 times daily 1 each 0   Lancets Misc. MISC 1-2 times a day 100 each 0    Results for orders placed or performed during the hospital encounter of 05/10/24 (from the past 48 hours)  Glucose, capillary     Status: Abnormal   Collection Time: 05/10/24  5:55 AM   Result Value Ref Range   Glucose-Capillary 129 (H) 70 - 99 mg/dL    Comment: Glucose reference range applies only to samples taken after fasting for at least 8 hours.   Comment 1 Notify RN    No results found.  Review of Systems  Constitutional:  Positive for activity change.  Musculoskeletal:  Positive for back pain and gait problem.  Neurological:  Positive for numbness.  All other systems reviewed and are negative.   Blood pressure 126/84, pulse 80, temperature 98.1 F (36.7 C), temperature source Oral, resp. rate 18, height 5' 6 (1.676 m), weight 79.8 kg, SpO2 96%. Physical Exam Constitutional:      Appearance: Normal appearance. He is normal weight.  HENT:     Head: Normocephalic and atraumatic.     Right Ear: Tympanic membrane, ear canal and external ear normal.     Left Ear: Tympanic membrane, ear canal and external ear normal.     Nose: Nose normal.     Mouth/Throat:     Mouth: Mucous membranes are moist.     Pharynx: Oropharynx is clear.  Eyes:     Extraocular Movements: Extraocular movements intact.     Conjunctiva/sclera: Conjunctivae normal.     Pupils: Pupils are equal, round, and reactive to light.  Cardiovascular:     Rate and Rhythm: Normal rate and regular rhythm.     Pulses: Normal pulses.     Heart sounds: Normal heart sounds.  Pulmonary:     Effort: Pulmonary effort is normal.     Breath sounds: Normal breath sounds.  Abdominal:     General: Abdomen is flat. Bowel sounds are normal.     Palpations: Abdomen is soft.  Musculoskeletal:        General: Normal range of motion.     Cervical back: Normal range of motion and neck supple.     Comments: Positive straight leg raising at 30 degrees in the left lower extremity positive at 45 degrees and right lower extremity  Skin:    General: Skin is warm and dry.     Capillary Refill: Capillary refill takes less than 2 seconds.  Neurological:     General: No focal deficit present.     Mental Status: He is  alert.  Psychiatric:        Mood and Affect: Mood normal.        Behavior: Behavior normal.        Thought Content: Thought content normal.        Judgment: Judgment normal.      Assessment/Plan Degenerative changes L5-S1 with left lumbar radiculopathy.  Plan: Anterior lumbar interbody arthrodesis L5-S1 posterior supplemental fixation L5-S1.  Victory JINNY Gens, MD 05/10/2024, 7:38 AM

## 2024-05-10 NOTE — Transfer of Care (Signed)
 Immediate Anesthesia Transfer of Care Note  Patient: Cristian Walker  Procedure(s) Performed: ANTERIOR LUMBAR FUSION LUMBAR FIVE-SACRAL ONE APPLICATION OF O-ARM LUMBAR PERCUTANEOUS PEDICLE SCREW LUMBAR FIVE-SACRALONE ABDOMINAL EXPOSURE  Patient Location: PACU  Anesthesia Type:General  Level of Consciousness: awake, alert , and oriented  Airway & Oxygen Therapy: Patient Spontanous Breathing and Patient connected to face mask oxygen  Post-op Assessment: Report given to RN and Post -op Vital signs reviewed and stable  Post vital signs: Reviewed and stable  Last Vitals:  Vitals Value Taken Time  BP 114/69 05/10/24 13:06  Temp    Pulse 93 05/10/24 13:10  Resp 12 05/10/24 13:10  SpO2 99 % 05/10/24 13:10  Vitals shown include unfiled device data.  Last Pain:  Vitals:   05/10/24 0629  TempSrc:   PainSc: 6       Patients Stated Pain Goal: 3 (05/10/24 9370)  Complications: No notable events documented.

## 2024-05-10 NOTE — Op Note (Signed)
 Date of surgery: 05/10/2024 Preoperative diagnosis: Chronic radiculopathy with degenerative disc L5-S1, chronic low back pain Postoperative diagnosis: Same Procedure: Anterior lumbar decompression arthrodesis L5-S1 with ALIF spacer allograft arthrodesis. Posterior supplemental fixation with pedicle screw fixation using O-arm guidance EMG monitoring. Surgeon: Victory Gens Anesthesia: General endotracheal Indications: Cristian Walker is a 43 year old individual who has had significant chronic back pain in addition to lumbar radicular pain on the left side he has had a small ruptured disc at L5-S1 which seems to have healed spontaneously however his left and was considerable chronic back pain which has been unresponsive to conservative measures for the last 4 years time after careful consideration of his options I advised anterior lumbar interbody decompression arthrodesis at L5-S1 followed by posterior supplemental fixation.  Procedure: The patient was brought to the operating room supine on the stretcher after the smooth induction of general endotracheal anesthesia he was placed on the operating table in supine position.  The anterior abdomen was marked with fluoroscopic guidance to localize the L5-S1 level and then Dr. Charlena Gelineau of vascular surgery performed an anterior retroperitoneal approach.  Once retractors were in place I then open the anterior longitudinal ligament at the L5-S1 level and performed a discectomy removing the entire contents of the disc space at the L5-S1 level down to the region of the posterior longitudinal ligament.  The ligament itself was not opened however it was decompressed to the endplates were small bony spurs were starting to form.  These were removed the endplates were rongeured smooth to remove all cartilaginous material.  Then the interspace was sized for an appropriate size spacer and was felt that a 6 mm tall posteriorly 28 x 38 mm spacer with 10 degrees lordosis  bringing the ventral surface up to 11 mm would fit appropriately.  This was filled with Osteocel allograft and placed into the interspace with bone graft being packed around 4 locking screws measuring 17.5 mm each were placed 2 into L5 and 2 into S1.  Final radiographs were obtained identifying good central position of the arthrodesis.  Once hemostasis was verified the anterior rectus sheath was closed by me with #1 running PDS suture 2-0 Vicryl was used in the subcutaneous tissues 3-0 and 4-0 Vicryl in the subcuticular skin Dermabond was placed on the skin.  Then the patient was turned prone onto a Jackson table the bony prominences were appropriately padded and protected.  After prepping the skin with alcohol DuraPrep and draping in a sterile fashion a guidepin was placed into the posterior superior iliac crest on the left side.  The guide post and array for the O-arm was then placed on top of this and an O-arm spin was performed.  Using the O-arm guidance system I then drew the entry site for incisions on the left and the right for placement of pedicle screws at L5-S1 10 cc of lidocaine  with epinephrine  with half percent Marcaine  was injected into each of the incisional sites.  The incision was made and carried down to the lumbodorsal fascia.  Fascia was opened with a 10 blade and then the stereotactic drill was used to drill pilot holes at L5 and S1.  At L5 5.5 mm tap was used using EMG monitoring with 20 mA stimulation.  No evidence of cut out was noted and the holes were tapped at L5 and S1 and 6.5 x 45 mm screws were then placed into the pedicles at L5 and S1 first on the right side then on the left side.  Each time EMG monitoring was performed with the tap and the screws and no evidence of cut out was noted.  With this a 25 mm precontoured rod was placed on the left side and on the right side a 30 mm rod was placed between the screws at L5 and S1 tightened in the neutral construct.  When verified with  fluoroscopic imaging the towers were removed the system was torqued down to final torque and then the guidance probe for the O-arm was removed also incisions were closed with 2-0 Vicryl and 3-0 Vicryl in the subcutaneous and subcuticular fashion some final 4-0 Vicryl suture was used in the subcuticular skin Dermabond was used on the skin blood loss for the entire procedure was estimated less than 100 cc.  Patient was then returned to recovery room in stable condition.

## 2024-05-10 NOTE — Op Note (Signed)
 DATE OF SERVICE: 05/10/2024  PATIENT:  Cristian Walker  43 y.o. male  PRE-OPERATIVE DIAGNOSIS:  back pain with radiculopathy  POST-OPERATIVE DIAGNOSIS:  Same  PROCEDURE:   anterior exposure of the L5/S1 disc space  CO-SURGEONS:  Panel 1:    DEWAINE Colon Shove, MD - Primary Panel 2:    * Magda Debby SAILOR, MD - Primary  ANESTHESIA:   general  EBL: 50mL  BLOOD ADMINISTERED:none  DRAINS: none   LOCAL MEDICATIONS USED:  NONE  SPECIMEN:  none  COUNTS: confirmed correct.  TOURNIQUET:  none  PATIENT DISPOSITION:  case turned over to Dr. Colon   Delay start of Pharmacological VTE agent (>24hrs) due to surgical blood loss or risk of bleeding: no  INDICATION FOR PROCEDURE: Cristian Walker is a 43 y.o. male with back pain with radiculopathy. After careful discussion of risks, benefits, and alternatives the patient was offered anterior exposure of L5/S1 disc space. The patient understood and wished to proceed.  OPERATIVE FINDINGS: unremarkable exposure of L5/S1 disc space. Correct disc space confirmed prior to turning case over to Dr. Colon.  DESCRIPTION OF PROCEDURE: After identification of the patient in the pre-operative holding area, the patient was transferred to the operating room. The patient was positioned supine on the operating room table. Anesthesia was induced. The abdomen was prepped and draped in standard fashion. A surgical pause was performed confirming correct patient, procedure, and operative location.  Intraoperative fluoroscopy was used to mark the skin over the disc base of L5/S1.  A transverse incision was made over the disc space and the left lower quadrant of the abdomen and carried down through subcutaneous tissue.  The rectus fascia was identified and incised longitudinally.  The avascular plane above the rectus muscle was developed.  We identified the transition from rectus fascia to the retroperitoneum.  This was entered bluntly.  The peritoneal envelope was  swept anteriorly.  Great care was taken to sweep the ureter and the other retroperitoneal structures that could be swept upward with this exposure.  I was able to then identify some vascular structures and felt the lumbar spine.  Blunt dissection was used to expose the L5/S1 disc space.  Identified the middle sacral vessels, clipped them and divided them.  Exposure was then carried onto either lateral side of the disc space to accommodate self-retaining system.  Once enough exposure was achieved the NuV[employer] self-retaining system was brought onto the field and positioned to hold our exposure.  Blades for the retractor system were positioned.  A spinal needle was introduced into the L5/S1 disc base.  A lateral radiograph was performed confirming correct disc level.  At this point the case was turned over to Dr. Colon.  Debby SAILOR. Magda, MD [facility] Phone Number: [phone] 05/10/2024 9:22 AM

## 2024-05-10 NOTE — Plan of Care (Signed)

## 2024-05-10 NOTE — Progress Notes (Signed)
 Orthopedic Tech Progress Note Patient Details:  Cristian Walker September 24, 1980 [id number]  Ortho Devices Type of Ortho Device: Lumbar corsett Ortho Device/Splint Location: Back Ortho Device/Splint Interventions: Ordered, Adjustment   Post Interventions Patient Tolerated: Well  Jazara Swiney A Sinahi Knights 05/10/2024, 3:53 PM

## 2024-05-11 DIAGNOSIS — Z4889 Encounter for other specified surgical aftercare: Secondary | ICD-10-CM

## 2024-05-11 LAB — GLUCOSE, CAPILLARY: Glucose-Capillary: 116 mg/dL — ABNORMAL HIGH (ref 70–99)

## 2024-05-11 MED ORDER — OXYCODONE-ACETAMINOPHEN 5-325 MG PO TABS: 1.0000 | ORAL_TABLET | ORAL | 0 refills | Status: DC | PRN

## 2024-05-11 MED ORDER — METHOCARBAMOL 500 MG PO TABS: 500.0000 mg | ORAL_TABLET | Freq: Four times a day (QID) | ORAL | 3 refills | Status: AC | PRN

## 2024-05-11 NOTE — Evaluation (Signed)
 Occupational Therapy Evaluation Patient Details Name: Cristian Walker MRN: [id number] DOB: [birth year] Today's Date: 05/11/2024   History of Present Illness   Cristian Walker is a 43 yo male who is s/p Anterior lumbar interbody arthrodesis L5-S1 posterior supplemental fixation L5-S1 11/20. PMHx: DM, HNP, HLD, HTN     Clinical Impressions Cristian Walker was evaluated s/p the above spine surgery. He is indep at baseline. Upon evaluation pt was limited by surgical pain, spinal precautions and decreased activity tolerance. Overall he demonstrated mod I ability to complete mobility and ADLs. RW utilized for pt's comfort. Provided cues and education on spinal precautions and compensatory techniques throughout, handout provided and pt demonstrated great recall during ADLs and mobility. Pt does not require further acute OT services. Recommend d/c home with support of family.       If plan is discharge home, recommend the following:   Assist for transportation;Assistance with cooking/housework     Functional Status Assessment   Patient has had a recent decline in their functional status and demonstrates the ability to make significant improvements in function in a reasonable and predictable amount of time.     Equipment Recommendations   None recommended by OT      Precautions/Restrictions   Precautions Precautions: Fall;Back Precaution Booklet Issued: Yes (comment) Required Braces or Orthoses: Spinal Brace Spinal Brace: Lumbar corset Restrictions Weight Bearing Restrictions Per Provider Order: No     Mobility Bed Mobility Overal bed mobility: Needs Assistance Bed Mobility: Rolling, Sidelying to Sit Rolling: Modified independent (Device/Increase time) Sidelying to sit: Modified independent (Device/Increase time)            Transfers Overall transfer level: Modified independent Equipment used: Rolling walker (2 wheels)               General transfer comment: pt  reports increased comfort with RW      Balance Overall balance assessment: Needs assistance Sitting-balance support: Feet supported Sitting balance-Leahy Scale: Good     Standing balance support: No upper extremity supported, During functional activity Standing balance-Leahy Scale: Fair             ADL either performed or assessed with clinical judgement   ADL Overall ADL's : Modified independent                 General ADL Comments: mod I with compenstory techniques after review of spinal precautions     Vision Baseline Vision/History: 0 No visual deficits Vision Assessment?: No apparent visual deficits     Perception Perception: Within Functional Limits       Praxis Praxis: WFL       Pertinent Vitals/Pain Pain Assessment Pain Assessment: Faces Faces Pain Scale: Hurts even more Pain Location: back Pain Descriptors / Indicators: Discomfort, Grimacing, Guarding Pain Intervention(s): Limited activity within patient's tolerance     Extremity/Trunk Assessment Upper Extremity Assessment Upper Extremity Assessment: Overall WFL for tasks assessed   Lower Extremity Assessment Lower Extremity Assessment: Defer to PT evaluation   Cervical / Trunk Assessment Cervical / Trunk Assessment: Back Surgery   Communication Communication Communication: No apparent difficulties   Cognition Arousal: Alert Behavior During Therapy: WFL for tasks assessed/performed Cognition: No apparent impairments           Following commands: Intact       Cueing  General Comments   Cueing Techniques: Verbal cues  VSS on RA           Home Living Family/patient expects to be discharged to:: Private residence  Living Arrangements: Spouse/significant other;Children Available Help at Discharge: Family;Available 24 hours/day Type of Home: House Home Access: Stairs to enter [employer] of Steps: 3 Entrance Stairs-Rails: Right;Left Home Layout: One level      Bathroom Shower/Tub: Chief Strategy Officer: Standard     Home Equipment: Cane - single point          Prior Functioning/Environment Prior Level of Function : Independent/Modified Independent             Mobility Comments: indep ADLs Comments: indep    OT Problem List: Decreased activity tolerance;Decreased knowledge of precautions   OT Treatment/Interventions:        OT Goals(Current goals can be found in the care plan section)   Acute Rehab OT Goals Patient Stated Goal: home OT Goal Formulation: With patient Time For Goal Achievement: 05/25/24 Potential to Achieve Goals: Good   AM-PAC OT 6 Clicks Daily Activity     Outcome Measure Help from another person eating meals?: None Help from another person taking care of personal grooming?: None Help from another person toileting, which includes using toliet, bedpan, or urinal?: None Help from another person bathing (including washing, rinsing, drying)?: None Help from another person to put on and taking off regular upper body clothing?: None Help from another person to put on and taking off regular lower body clothing?: None 6 Click Score: 24   End of Session Equipment Utilized During Treatment: Rolling walker (2 wheels);Back brace Nurse Communication: Mobility status  Activity Tolerance: Patient tolerated treatment well Patient left: in bed  OT Visit Diagnosis: Pain                Time: 9140-9083 OT Time Calculation (min): 17 min Charges:  OT General Charges $OT Visit: 1 Visit OT Evaluation $OT Eval Low Complexity: 1 Low  Lucie Kendall, OTR/L Acute Rehabilitation Services Office [phone] Secure Chat Communication Preferred   Lucie JONETTA Kendall 05/11/2024, 9:49 AM

## 2024-05-11 NOTE — Progress Notes (Signed)

## 2024-05-11 NOTE — Anesthesia Postprocedure Evaluation (Signed)
 Anesthesia Post Note  Patient: Cristian Walker  Procedure(s) Performed: ANTERIOR LUMBAR FUSION LUMBAR FIVE-SACRAL ONE APPLICATION OF O-ARM LUMBAR PERCUTANEOUS PEDICLE SCREW LUMBAR FIVE-SACRALONE ABDOMINAL EXPOSURE     Patient location during evaluation: PACU Anesthesia Type: General Level of consciousness: awake and alert Pain management: pain level controlled Vital Signs Assessment: post-procedure vital signs reviewed and stable Respiratory status: spontaneous breathing, nonlabored ventilation and respiratory function stable Cardiovascular status: blood pressure returned to baseline and stable Postop Assessment: no apparent nausea or vomiting Anesthetic complications: no   No notable events documented.            Analysia Dungee

## 2024-05-11 NOTE — Evaluation (Signed)
 Physical Therapy Evaluation  Patient Details Name: Cristian Walker MRN: [id number] DOB: 01-Jun-1981 Today's Date: 05/11/2024  History of Present Illness  Cristian Walker is a 43 yo male who is s/p Anterior lumbar interbody arthrodesis L5-S1 posterior supplemental fixation L5-S1 11/20. PMHx: DM, HNP, HTN  Clinical Impression  Patient evaluated by Physical Therapy with no further acute PT needs identified. All education has been completed and the patient has no further questions. Pt was able to demonstrate transfers and ambulation with gross modified independence and SPC for support. Pt was educated on precautions, brace application/wearing schedule, appropriate activity progression, and car transfer. See below for any follow-up Physical Therapy or equipment needs. PT is signing off. Thank you for this referral.         If plan is discharge home, recommend the following:     Can travel by private vehicle        Equipment Recommendations Cane  Recommendations for Other Services       Functional Status Assessment       Precautions / Restrictions Precautions Precautions: Fall;Back Precaution Booklet Issued: Yes (comment) Recall of Precautions/Restrictions: Intact Precaution/Restrictions Comments: Reviewed handout and pt was cued for precautions during functional mobility. Required Braces or Orthoses: Spinal Brace Spinal Brace: Lumbar corset Restrictions Weight Bearing Restrictions Per Provider Order: No      Mobility  Bed Mobility     Rolling: Modified independent (Device/Increase time)              Transfers Overall transfer level: Modified independent Equipment used: Straight cane               General transfer comment: VC's for hand placement on seated surface for safety.    Ambulation/Gait Ambulation/Gait assistance: Modified independent (Device/Increase time) Gait Distance (Feet): 300 Feet Assistive device: Straight cane Gait Pattern/deviations:  Step-through pattern, Decreased stride length, Trunk flexed       General Gait Details: VC's for improved posture, closer walker proximity and forward gaze. Pt with good sequencing with cane.  Stairs Stairs: Yes Stairs assistance: Contact guard assist Stair Management: One rail Left, Step to pattern, Forwards, With cane Number of Stairs: 6 General stair comments: VC's for sequencing and general safety.  Wheelchair Mobility     Tilt Bed    Modified Rankin (Stroke Patients Only)       Balance   Sitting-balance support: Feet supported Sitting balance-Leahy Scale: Good     Standing balance support: No upper extremity supported, During functional activity Standing balance-Leahy Scale: Fair                               Pertinent Vitals/Pain Pain Assessment Pain Assessment: Faces Faces Pain Scale: Hurts even more Pain Location: back Pain Descriptors / Indicators: Discomfort, Grimacing, Guarding Pain Intervention(s): Limited activity within patient's tolerance, Monitored during session, Repositioned    Home Living   Living Arrangements: Spouse/significant other;Children               Home Equipment: None      Prior Function                       Extremity/Trunk Assessment                Communication   Communication Communication: No apparent difficulties    Cognition  Cueing       General Comments      Exercises     Assessment/Plan    PT Assessment    PT Problem List         PT Treatment Interventions      PT Goals (Current goals can be found in the Care Plan section)       Frequency       Co-evaluation               AM-PAC PT 6 Clicks Mobility  Outcome Measure Help needed turning from your back to your side while in a flat bed without using bedrails?: None Help needed moving from lying on your back to sitting on the side of a flat bed  without using bedrails?: None Help needed moving to and from a bed to a chair (including a wheelchair)?: None Help needed standing up from a chair using your arms (e.g., wheelchair or bedside chair)?: None Help needed to walk in hospital room?: None Help needed climbing 3-5 steps with a railing? : A Little 6 Click Score: 23    End of Session Equipment Utilized During Treatment: Gait belt;Back brace Activity Tolerance: Patient tolerated treatment well Patient left: in bed;with call bell/phone within reach;with family/visitor present Nurse Communication: Mobility status PT Visit Diagnosis: Unsteadiness on feet (R26.81);Pain Pain - part of body:  (back)    Time: 1012-1028 PT Time Calculation (min) (ACUTE ONLY): 16 min   Charges:   PT Evaluation $PT Eval Low Complexity: 1 Low   PT General Charges $$ ACUTE PT VISIT: 1 Visit         Leita Sable, PT, DPT Acute Rehabilitation Services Secure Chat Preferred Office: [phone]   Leita JONETTA Sable 05/11/2024, 2:27 PM

## 2024-05-11 NOTE — Discharge Summary (Signed)
 Physician Discharge Summary  Patient ID: Cristian Walker MRN: [id number] DOB/AGE: 45-Mar-1982 45 y.o.  Admit date: 05/10/2024 Discharge date: 05/11/2024  Admission Diagnoses: Lumbar spondylosis with lumbar radiculopathy L5-S1  Discharge Diagnoses: Lumbar spondylosis with lumbar radiculopathy L5-S1 Principal Problem:   Chronic left-sided lumbar radiculopathy   Discharged Condition: good  Hospital Course: Patient tolerated surgery well.  He has had relief of his left leg pain.  Consults: None  Significant Diagnostic Studies: None  Treatments: surgery: See op note  Discharge Exam: Blood pressure 119/83, pulse 82, temperature 98.8 F (37.1 C), resp. rate 20, height 5' 6 (1.676 m), weight 79.8 kg, SpO2 100%. Incision is clean and dry Station and gait are intact  Disposition: Discharge disposition: 01-Home or Self Care       Discharge Instructions     Call MD for:  redness, tenderness, or signs of infection (pain, swelling, redness, odor or green/yellow discharge around incision site)   Complete by: As directed    Call MD for:  severe uncontrolled pain   Complete by: As directed    Call MD for:  temperature >100.4   Complete by: As directed    Diet - low sodium heart healthy   Complete by: As directed    Discharge wound care:   Complete by: As directed    Remove honeycomb dressings on day 1.  Patient may shower.  Pat incision dry.  Do not use salves or ointments on incision.  Glue will come off in about 10 days to 2 weeks.   Incentive spirometry RT   Complete by: As directed    Increase activity slowly   Complete by: As directed       Allergies as of 05/11/2024       Reactions   Chlorhexidine  Gluconate [chlorhexidine ] Itching   CHG soap made him red all over his body and very itchy after one shower.         Medication List     TAKE these medications    acetaminophen  500 MG tablet Commonly known as: TYLENOL  Take 500-1,000 mg by mouth every 6 (six)  hours as needed (pain.).   atorvastatin  40 MG tablet Commonly known as: Lipitor Take 1 tablet (40 mg total) by mouth daily. What changed: when to take this   blood glucose meter kit and supplies Dispense based on patient and insurance preference. Test once a daily   Blood Glucose Monitoring Suppl Devi Test 1-2 times day. Pend on Insurance   BLOOD GLUCOSE TEST STRIPS Strp Test 1-2 times daily   gabapentin  300 MG capsule Commonly known as: NEURONTIN  Take 300 mg by mouth in the morning and at bedtime.   ICY HOT MAX LIDOCAINE  EX Apply 1 Application topically 3 (three) times daily as needed (pain.).   Lancet Device Misc 1-2 times daily   Lancets Misc. Misc 1-2 times a day   lisinopril  10 MG tablet Commonly known as: ZESTRIL  TAKE 1 TABLET BY MOUTH EVERY DAY What changed: when to take this   metFORMIN  500 MG tablet Commonly known as: GLUCOPHAGE  Take 1 tablet (500 mg total) by mouth at bedtime. What changed: when to take this   methocarbamol  500 MG tablet Commonly known as: ROBAXIN  Take 1 tablet (500 mg total) by mouth every 6 (six) hours as needed for muscle spasms.   naproxen  sodium 220 MG tablet Commonly known as: ALEVE  Take 220-440 mg by mouth 2 (two) times daily as needed (pain).   oxyCODONE -acetaminophen  5-325 MG tablet Commonly known as: PERCOCET/ROXICET  Take 1-2 tablets by mouth every 4 (four) hours as needed for moderate pain (pain score 4-6) or severe pain (pain score 7-10).               Discharge Care Instructions  (From admission, onward)           Start     Ordered   05/11/24 0000  Discharge wound care:       Comments: Remove honeycomb dressings on day 1.  Patient may shower.  Pat incision dry.  Do not use salves or ointments on incision.  Glue will come off in about 10 days to 2 weeks.   05/11/24 9185             Signed: Victory PARAS Tison Leibold 05/11/2024, 8:14 AM

## 2024-05-11 NOTE — Progress Notes (Signed)
  Progress Note    05/11/2024 7:03 AM 1 Day Post-Op  Subjective:  says he has some tingling in the right foot.  Left leg feels better.   afebrile  Vitals:   05/10/24 2253 05/11/24 0516  BP: 117/84 (!) 95/59  Pulse: 93 67  Resp: 18 18  Temp: 97.6 F (36.4 C) 97.7 F (36.5 C)  SpO2: 99% 97%    Physical Exam: General:  no distress Cardiac:  regular Lungs:  non labored Incisions:  LLQ abdominal incision with honeycomb dressing.  Clean and dry.  Extremities:  palpable DP pulses bilaterally Abdomen:  soft  CBC    Component Value Date/Time   WBC 4.8 05/04/2024 0822   RBC 5.17 05/04/2024 0822   HGB 15.4 05/04/2024 0822   HGB 15.4 06/09/2023 0909   HCT 45.1 05/04/2024 0822   HCT 46.2 06/09/2023 0909   PLT 301 05/04/2024 0822   PLT 317 06/09/2023 0909   MCV 87.2 05/04/2024 0822   MCV 89 06/09/2023 0909   MCH 29.8 05/04/2024 0822   MCHC 34.1 05/04/2024 0822   RDW 11.8 05/04/2024 0822   RDW 12.4 06/09/2023 0909   LYMPHSABS 1.8 03/10/2020 1338   MONOABS 0.4 08/09/2007 1128   EOSABS 0.1 03/10/2020 1338   BASOSABS 0.0 03/10/2020 1338    BMET    Component Value Date/Time   NA 141 05/04/2024 0822   NA 141 06/09/2023 0909   K 3.8 05/04/2024 0822   CL 105 05/04/2024 0822   CO2 25 05/04/2024 0822   GLUCOSE 101 (H) 05/04/2024 0822   BUN 13 05/04/2024 0822   BUN 11 06/09/2023 0909   CREATININE 0.87 05/04/2024 0822   CREATININE 1.06 03/04/2016 0733   CALCIUM  8.8 (L) 05/04/2024 0822   GFRNONAA >60 05/04/2024 0822   GFRAA 113 03/10/2020 1338    INR No results found for: INR   Intake/Output Summary (Last 24 hours) at 05/11/2024 0703 Last data filed at 05/10/2024 1856 Gross per 24 hour  Intake 3500 ml  Output 600 ml  Net 2900 ml      Assessment/Plan:  43 y.o. male is s/p:  anterior exposure of the L5/S1 disc space 05/10/2024 by Dr. Magda  1 Day Post-Op   -pt doing well with palpable DP pulses bilaterally -doing well from vascular standpoint.  F/u  with [facility] as needed.     Lucie Apt, PA-C [facility] [phone] 05/11/2024 7:03 AM

## 2024-05-11 NOTE — Discharge Instructions (Signed)
 Wound Care Remove dressing in 3 days Leave incision open to air. You may shower. Do not scrub directly on incision.  Do not put any creams, lotions, or ointments on incision. Activity Walk each and every day, increasing distance each day. No lifting greater than 8lbs.  Avoid bending, arching, and twisting. No driving for 2 weeks; may ride as a passenger locally. If provided with back brace, wear when out of bed.  It is not necessary to wear in bed. Diet Resume your normal diet.  Return to Work Will be discussed at you follow up appointment. Call Your Doctor If Any of These Occur Redness, drainage, or swelling at the wound.  Temperature greater than 101 degrees. Severe pain not relieved by pain medication. Incision starts to come apart. Follow Up Appt Call today for appointment in 4 weeks [phone]) or for problems.  If you have any hardware placed in your spine, you will need an x-ray before your appointment.

## 2024-05-14 ENCOUNTER — Encounter (HOSPITAL_COMMUNITY): Payer: Self-pay | Admitting: Neurological Surgery

## 2024-05-14 ENCOUNTER — Telehealth: Payer: Self-pay

## 2024-05-14 MED FILL — Sodium Chloride IV Soln 0.9%: INTRAVENOUS | Qty: 2000 | Status: AC

## 2024-05-14 MED FILL — Heparin Sodium (Porcine) Inj 1000 Unit/ML: INTRAMUSCULAR | Qty: 30 | Status: AC

## 2024-05-14 NOTE — Transitions of Care (Post Inpatient/ED Visit) (Addendum)
 Discharge Diagnoses: Lumbar spondylosis with lumbar radiculopathy L5-S1 Principal Problem:   Chronic left-sided lumbar radiculopathy  Today's TOC FU Call Status: Today's TOC FU Call Status:: Successful TOC FU Call Completed TOC FU Call Complete Date: 05/14/24  Patient's Name and Date of Birth confirmed. Name, DOB  Transition Care Management Follow-up Telephone Call Date of Discharge: 05/11/24 Discharge Facility: [facility]) Type of Discharge: Inpatient Admission Primary Inpatient Discharge Diagnosis:: L5-S1 decompression with posterior fixation. How have you been since you were released from the hospital?: Better Any questions or concerns?: No  Items Reviewed: Did you receive and understand the discharge instructions provided?: Yes Medications obtained,verified, and reconciled?: Yes (Medications Reviewed) Any new allergies since your discharge?: Yes Dietary orders reviewed?: Yes Type of Diet Ordered:: Low Sodium Do you have support at home?: Yes People in Home [RPT]: spouse Name of Support/Comfort Primary Source: Gritz,Amber (Spouse)  [phone] (Mobile)  Medications Reviewed Today: Medications Reviewed Today     Reviewed by Carolee Heron NOVAK, RN (Case Manager) on 05/14/24 at 1147  Med List Status: <None>   Medication Order Taking? Sig Documenting Provider Last Dose Status Informant  acetaminophen  (TYLENOL ) 500 MG tablet [id number] Yes Take 500-1,000 mg by mouth every 6 (six) hours as needed (Pain but not taking in addition to post pain medications with Tylenol  in them per patient.). [provider]  Active Self           Med Note CLAUD, MICHEAL T   Tue May 01, 2024  1:52 PM)    atorvastatin  (LIPITOR) 40 MG tablet [id number] Yes Take 1 tablet (40 mg total) by mouth daily.  Patient taking differently: Take 40 mg by mouth daily after supper.   Tysinger, Alm RAMAN, PA-C  Active Self  blood glucose meter kit and supplies [id number] Yes Dispense based on patient and  insurance preference. Test once a daily Bulah Alm RAMAN DEVONNA  Active Self  Blood Glucose Monitoring Suppl DEVI [id number] Yes Test 1-2 times day. Pend on [employer], Alm RAMAN, [place]  Active Self  gabapentin  (NEURONTIN ) 300 MG capsule [id number] Yes Take 300 mg by mouth in the morning and at bedtime. [provider]  Active Self           Med Note CLAUD MICHEAL ONEIDA Sonjia May 01, 2024  1:41 PM)    Glucose Blood (BLOOD GLUCOSE TEST STRIPS) STRP [id number] Yes Test 1-2 times daily Bulah Alm RAMAN DEVONNA  Active Self  Lancet Device MISC [id number] Yes 1-2 times daily Tysinger, Alm RAMAN, PA-C  Active Self  Lancets Misc. MISC [id number] Yes 1-2 times a day Bulah Alm RAMAN, PA-C  Active Self  Lidocaine  HCl (ICY HOT MAX LIDOCAINE  EX) [id number] Yes Apply 1 Application topically 3 (three) times daily as needed (pain.). [provider]  Active Self  lisinopril  (ZESTRIL ) 10 MG tablet [id number] Yes TAKE 1 TABLET BY MOUTH EVERY DAY  Patient taking differently: Take 10 mg by mouth daily with lunch.   Tysinger, Alm RAMAN, PA-C  Active Self  metFORMIN  (GLUCOPHAGE ) 500 MG tablet [id number]  Take 1 tablet (500 mg total) by mouth at bedtime.  Patient taking differently: Take 500 mg by mouth daily after supper.   Tysinger, Alm RAMAN, PA-C  Active Self  methocarbamol  (ROBAXIN ) 500 MG tablet [id number] Yes Take 1 tablet (500 mg total) by mouth every 6 (six) hours as needed for muscle spasms. Colon Shove, MD  Active   naproxen  sodium (ALEVE ) 220 MG tablet [id number]  Take 220-440 mg by  mouth 2 (two) times daily as needed (pain).  Patient not taking: Reported on 05/14/2024   [provider]  Active Self  oxyCODONE -acetaminophen  (PERCOCET/ROXICET) 5-325 MG tablet [id number] Yes Take 1-2 tablets by mouth every 4 (four) hours as needed for moderate pain (pain score 4-6) or severe pain (pain score 7-10). Joshua Alm Hamilton, MD  Active             Home Care and Equipment/Supplies: Were Home  Health Services Ordered?: No Any new equipment or medical supplies ordered?: Yes Name of Medical supply agency?: Purchased Were you able to get the equipment/medical supplies?: Yes Do you have any questions related to the use of the equipment/supplies?: No (Wants to order a toilet seat riser with will speak to insurance RN calling today post operative follow up.)  Functional Questionnaire: Do you need assistance with bathing/showering or dressing?: No Do you need assistance with meal preparation?: No Do you need assistance with eating?: No Do you have difficulty maintaining continence: No Do you need assistance with getting out of bed/getting out of a chair/moving?: No Do you have difficulty managing or taking your medications?: No  Follow up appointments reviewed: PCP Follow-up appointment confirmed?: Yes Date of PCP follow-up appointment?: 06/12/24 (Patient preferences to call to request HFU post discharge to see if can be seen sooner after rationale explained.) Follow-up Provider: [facility] Bulah at [facility] Follow-up appointment confirmed?: Yes Date of Specialist follow-up appointment?: 06/04/24 Follow-Up Specialty Provider:: Dr Elsner/Neurosurgeon Do you need transportation to your follow-up appointment?: No Do you understand care options if your condition(s) worsen?: Yes-patient verbalized understanding (Discussed post op/discharge instructions including what to call provider for including numbness, onset of weakness,  changes in bladder or bowel function, constipation medication OTC while on pain medication, or pain does not improve.)  SDOH Interventions Today    Flowsheet Row Most Recent Value  SDOH Interventions   Food Insecurity Interventions Intervention Not Indicated  Housing Interventions Intervention Not Indicated  Transportation Interventions Intervention Not Indicated, Patient Resources (Friends/Family)  Utilities Interventions  Intervention Not Indicated  Health Literacy Interventions Intervention Not Indicated   Discharge Exam: (Per DC Summary) Blood pressure 119/83, pulse 82, temperature 98.8 F (37.1 C), resp. rate 20, height 5' 6 (1.676 m), weight 79.8 kg, SpO2 100%.  Incision is clean and dry Station and gait are intact.  05/04/24: Successful post discharge outreach by phone with patient. States he understand DC instructions, limitations, what to call provider for and follow up appointment rationale. He prefers to call to make sooner HFU with PCP. Having significant pain starting Day 3 post op mostly after not moving much and then moving again. Reviewed pain med/education, but suggested he contact provider if pain level does not ease off appropriately with prescribed pain medications, rest intervals with approved movement/activity. Patient verbalized understanding. Also discussed/reviewed/educations on noted Care Gaps. Patient stated he did received his flu vaccine last month and would discuss the others with PCP or specialist.   Discussed and offered 30 day TOC program.  Patient  verbally declined. The patient has been provided with contact information for the care management team and has been advised to call with any health -related questions or concerns.  The patient verbalized understanding with current plan of care and what to contact providers for. The patient is directed to their insurance card regarding availability of benefits coverage.    Bing Edison MSN, RN RN [employer] [facility]  [facility] Office Hours M-F [phone] Direct Dial:  [phone] Main Phone [phone]  Fax: [phone] [email or web address]

## 2024-06-12 ENCOUNTER — Ambulatory Visit (INDEPENDENT_AMBULATORY_CARE_PROVIDER_SITE_OTHER): Payer: 59 | Admitting: Medical

## 2024-06-12 VITALS — BP 120/70 | HR 90 | Ht 66.0 in | Wt 162.8 lb

## 2024-06-12 DIAGNOSIS — E118 Type 2 diabetes mellitus with unspecified complications: Secondary | ICD-10-CM | POA: Diagnosis not present

## 2024-06-12 DIAGNOSIS — E785 Hyperlipidemia, unspecified: Secondary | ICD-10-CM

## 2024-06-12 DIAGNOSIS — Z8739 Personal history of other diseases of the musculoskeletal system and connective tissue: Secondary | ICD-10-CM

## 2024-06-12 DIAGNOSIS — Z8249 Family history of ischemic heart disease and other diseases of the circulatory system: Secondary | ICD-10-CM | POA: Diagnosis not present

## 2024-06-12 DIAGNOSIS — I10 Essential (primary) hypertension: Secondary | ICD-10-CM | POA: Diagnosis not present

## 2024-06-12 DIAGNOSIS — R200 Anesthesia of skin: Secondary | ICD-10-CM

## 2024-06-12 DIAGNOSIS — Z79899 Other long term (current) drug therapy: Secondary | ICD-10-CM

## 2024-06-12 DIAGNOSIS — G8929 Other chronic pain: Secondary | ICD-10-CM | POA: Diagnosis not present

## 2024-06-12 DIAGNOSIS — Z0000 Encounter for general adult medical examination without abnormal findings: Secondary | ICD-10-CM

## 2024-06-12 DIAGNOSIS — M545 Low back pain, unspecified: Secondary | ICD-10-CM

## 2024-06-12 NOTE — Progress Notes (Signed)
 "   Name: Cristian Walker   Date of Visit: 06/12/2024   Date of last visit with me: 04/21/2024   CHIEF COMPLAINT:  Chief Complaint  Patient presents with   Annual Exam    Fasting cpe, no concerns, no eye exam in the last year       HPI:  Discussed the use of AI scribe software for clinical note transcription with the patient, who gave verbal consent to proceed.  History of Present Illness  Cristian Walker is a 43 year old male for well visit.  Accompanied by wife.     Patient Care Team: Janisse Ghan, Alm RAMAN, PA-C as PCP - General (Family Medicine) Reggie Nian, Thora, NP as Nurse Practitioner (Pain Medicine) Colon Shove, MD as Consulting Physician (Neurosurgery) Magda Debby SAILOR, MD as Consulting Physician (Vascular Surgery) Chiaramonti, Marsa HERO, MD as Consulting Physician (Orthopedic Surgery)  He has experienced a weight loss of over ten pounds since his last visit intentionally. He has not seen an eye doctor in the past year due to scheduling conflicts and running out of sick time at work. He received a flu vaccine in early November before his surgery.  He underwent an anterior lumbar interbody fusion (ALIF) surgery with posterior screws and rods on December 10th. Post-surgery, he experiences discomfort at the incision site, numbness in his right foot, and occasional pain when using the bathroom. The numbness in his right foot began approximately four days post-surgery. The patient reports that he was told his x-ray after surgery showed everything was in place. He has not started physical therapy yet and is on restrictions from bending, lifting, and twisting for eight weeks. He is scheduled for a follow-up on January 21st.  His hemoglobin A1c has been stable at 5.7, down from 6.2 in July of last year. He is currently taking metformin  500 mg once at night. He experienced a blood sugar spike to 240 during his hospital stay, likely due to anesthesia during his five and a half  hour surgery.  He is currently taking Lipitor (atorvastatin ) 40 mg daily, gabapentin  300 mg once daily, lisinopril  10 mg once daily, and hydrocodone  every eight hours as needed. He was previously on oxycodone . He also uses a muscle relaxer, Robaxin , as needed, and has been using Flonase  for congestion.  He denies any alcohol use and smoking. He reports congestion and a sore throat in the mornings, which resolves later in the day. He attributes this to environmental factors at home, including a new puppy.  He had a nuclear stress test in April 2025 at [facility], but the results are not available in his current medical records. He mentions a previous EKG error due to improper lead placement.  Allergies[1]  Past Medical History:  Diagnosis Date   Diabetes mellitus without complication (HCC) 2023   HNP (herniated nucleus pulposus), cervical    C4-5   Hyperlipidemia    Hypertension    Spontaneous pneumothorax    3 times, 2009, 2007, 2005    Medications Ordered Prior to Encounter[2]   Current Medications[3]  Family History  Problem Relation Age of Onset   Hypertension Mother    Heart disease Father 12       stent   Alcohol abuse Father    Heart disease Paternal Uncle        CABG   Cancer Maternal Grandfather        lung, asbestos    Past Surgical History:  Procedure Laterality Date   ABDOMINAL  EXPOSURE N/A 05/10/2024   Procedure: ABDOMINAL EXPOSURE;  Surgeon: Magda Debby SAILOR, MD;  Location: [facility] OR;  Service: Vascular;  Laterality: N/A;   ANTERIOR CERVICAL DECOMP/DISCECTOMY FUSION N/A 11/23/2017   Procedure: C4-5 ANTERIOR CERVICAL DECOMPRESSION/DISCECTOMY FUSION, ALLOGRAFT, PLATE;  Surgeon: Barbarann Oneil BROCKS, MD;  Location: [facility] OR;  Service: Orthopedics;  Laterality: N/A;   ANTERIOR LUMBAR FUSION N/A 05/10/2024   Procedure: ANTERIOR LUMBAR FUSION LUMBAR FIVE-SACRAL ONE;  Surgeon: Colon Shove, MD;  Location: [facility] OR;  Service: Neurosurgery;  Laterality: N/A;   HERNIA REPAIR      Right Inguinal   LUMBAR PERCUTANEOUS PEDICLE SCREW 1 LEVEL N/A 05/10/2024   Procedure: LUMBAR PERCUTANEOUS PEDICLE SCREW LUMBAR FIVE-SACRALONE;  Surgeon: Colon Shove, MD;  Location: [facility] OR;  Service: Neurosurgery;  Laterality: N/A;   LUNG SURGERY  2009, 2007, 2005   spontaneous pneumothorax x 3 surgeries   ULNAR NERVE DECOMPRESSION Right    Review of Systems  Constitutional:  Negative for chills, fever, malaise/fatigue and weight loss.  HENT:  Negative for congestion, ear pain, hearing loss, sore throat and tinnitus.   Eyes:  Negative for blurred vision, pain and redness.  Respiratory:  Negative for cough, hemoptysis and shortness of breath.   Cardiovascular:  Negative for chest pain, palpitations, orthopnea, claudication and leg swelling.  Gastrointestinal:  Negative for abdominal pain, blood in stool, constipation, diarrhea, nausea and vomiting.  Genitourinary:  Negative for dysuria, flank pain, frequency, hematuria and urgency.  Musculoskeletal:  Positive for back pain. Negative for falls, joint pain and myalgias.  Skin:  Negative for itching and rash.  Neurological:  Positive for tingling. Negative for dizziness, speech change, weakness and headaches.  Endo/Heme/Allergies:  Negative for polydipsia. Does not bruise/bleed easily.  Psychiatric/Behavioral:  Negative for depression and memory loss. The patient is not nervous/anxious and does not have insomnia.      Objective: BP 120/70   Pulse 90   Ht 5' 6 (1.676 m)   Wt 162 lb 12.8 oz (73.8 kg)   SpO2 99%   BMI 26.28 kg/m   General appearence: alert, no distress, WD/WN,  HEENT: normocephalic, sclerae anicteric, TMs pearly, nares with clear discharge and mild erythema, pharynx normal Oral cavity: MMM, no lesions Neck: supple, no lymphadenopathy, no thyromegaly, no masses Heart: RRR, normal S1, S2, no murmurs Lungs: CTA bilaterally, no wheezes, rhonchi, or rales Abdomen: +bs, soft, left lower abdomen with surgical scar low  transverse,non tender, non distended, no masses, no hepatomegaly, no splenomegaly Back with 2 vertical surgical scars, healing normally, back brace in place, range of motion decreased due to status post recent surgery MSK: Legs nontender, no swelling, no obvious deformity Pulses: 2+ symmetric, upper and lower extremities, normal cap refill GU: Circumcised, left testicle riding lower than the right, mild tenderness of the right testicle that he attributes to the recent surgery, no obvious discoloration, no hernia or lymphadenopathy Rectal-deferred  Diabetic Foot Exam - Simple   Simple Foot Form Diabetic Foot exam was performed with the following findings: Yes 06/12/2024  8:41 AM  Visual Inspection See comments: Yes Sensation Testing See comments: Yes Pulse Check Posterior Tibialis and Dorsalis pulse intact bilaterally: Yes Comments Slight sensation decrease on monofilament right foot, compared to left, 4-5/5 strength right foot, but ROM normal, foot otherwise unremarkable     Results Labs CBC (04/2024): Within normal limits Electrolytes (04/2024): Within normal limits except calcium  slightly low Hemoglobin A1c (04/2024): 5.7%  Radiology CT coronary calcium  score (06/2021): Coronary artery calcium  score zero  Pathology Cologuard (  2025): Negative   Assessment and Plan Encounter Diagnoses  Name Primary?   Encounter for health maintenance examination in adult Yes   Type II diabetes mellitus with complication (HCC)    Hyperlipidemia, unspecified hyperlipidemia type    Essential hypertension, benign    Family history of premature CAD    Chronic bilateral low back pain, unspecified whether sciatica present    Numbness of right foot    History of spinal stenosis    High risk medication use     Type 2 diabetes mellitus Well-controlled with hemoglobin A1c of 5.7%. Weight loss contributed to improved glycemic control. - Discontinue metformin  500 mg once daily.  Essential  hypertension Managed with lisinopril  10 mg daily.  Hyperlipidemia Managed with atorvastatin  40 mg daily.  Chronic low back pain status post lumbar fusion Persistent pain post-surgery with discomfort at incision site and right foot numbness. Recovery ongoing with expected improvement over two months. - Continue activity restrictions until January 21st. - Initiate physical therapy after January 21st. - Encouraged home exercises such as toe movements and resistance band exercises.  Right foot numbness Persistent numbness since four days post-surgery. Awaiting further evaluation post-surgery. - Monitor symptoms and reassess after physical therapy initiation.  General Health Maintenance Up to date on vaccinations. Advised to schedule diabetic eye exam. Negative Cologuard test. No significant family history of prostate cancer. - Schedule a diabetic eye exam. - Continue routine dental visits twice a year. - Consider prostate cancer screening at age 43.  We will have him sign the a copy of the recent cardiac nuclear stress test he had earlier this year at [facility]  Kiefer was seen today for annual exam.  Diagnoses and all orders for this visit:  Encounter for health maintenance examination in adult -     Lipid panel -     Hepatic function panel -     Microalbumin/Creatinine Ratio, Urine -     Urinalysis, Routine w reflex microscopic -     Vitamin B12  Type II diabetes mellitus with complication (HCC) -     Microalbumin/Creatinine Ratio, Urine -     Urinalysis, Routine w reflex microscopic  Hyperlipidemia, unspecified hyperlipidemia type -     Lipid panel  Essential hypertension, benign  Family history of premature CAD  Chronic bilateral low back pain, unspecified whether sciatica present  Numbness of right foot -     Vitamin B12  History of spinal stenosis  High risk medication use    F/u pending labs     [1]  Allergies Allergen Reactions    Chlorhexidine  Gluconate [Chlorhexidine ] Itching    CHG soap made him red all over his body and very itchy after one shower.   [2]  Current Outpatient Medications on File Prior to Visit  Medication Sig Dispense Refill   acetaminophen  (TYLENOL ) 500 MG tablet Take 500-1,000 mg by mouth every 6 (six) hours as needed (Pain but not taking in addition to post pain medications with Tylenol  in them per patient.).     atorvastatin  (LIPITOR) 40 MG tablet Take 1 tablet (40 mg total) by mouth daily. 90 tablet 2   gabapentin  (NEURONTIN ) 300 MG capsule Take 300 mg by mouth in the morning and at bedtime. (Patient taking differently: Take 300 mg by mouth daily.)     HYDROcodone -acetaminophen  (NORCO/VICODIN) 5-325 MG tablet      lisinopril  (ZESTRIL ) 10 MG tablet TAKE 1 TABLET BY MOUTH EVERY DAY 90 tablet 0   metFORMIN  (GLUCOPHAGE ) 500 MG tablet Take  1 tablet (500 mg total) by mouth at bedtime. 90 tablet 1   methocarbamol  (ROBAXIN ) 500 MG tablet Take 1 tablet (500 mg total) by mouth every 6 (six) hours as needed for muscle spasms. 30 tablet 3   blood glucose meter kit and supplies Dispense based on patient and insurance preference. Test once a daily 1 each 0   Blood Glucose Monitoring Suppl DEVI Test 1-2 times day. Pend on Insurance 1 each 0   Glucose Blood (BLOOD GLUCOSE TEST STRIPS) STRP Test 1-2 times daily 100 strip 0   Lancet Device MISC 1-2 times daily 1 each 0   Lancets Misc. MISC 1-2 times a day 100 each 0   naproxen  sodium (ALEVE ) 220 MG tablet Take 220-440 mg by mouth 2 (two) times daily as needed (pain). (Patient not taking: Reported on 06/12/2024)     No current facility-administered medications on file prior to visit.  [3]  Current Outpatient Medications:    acetaminophen  (TYLENOL ) 500 MG tablet, Take 500-1,000 mg by mouth every 6 (six) hours as needed (Pain but not taking in addition to post pain medications with Tylenol  in them per patient.)., Disp: , Rfl:    atorvastatin  (LIPITOR) 40 MG tablet,  Take 1 tablet (40 mg total) by mouth daily., Disp: 90 tablet, Rfl: 2   gabapentin  (NEURONTIN ) 300 MG capsule, Take 300 mg by mouth in the morning and at bedtime. (Patient taking differently: Take 300 mg by mouth daily.), Disp: , Rfl:    HYDROcodone -acetaminophen  (NORCO/VICODIN) 5-325 MG tablet, , Disp: , Rfl:    lisinopril  (ZESTRIL ) 10 MG tablet, TAKE 1 TABLET BY MOUTH EVERY DAY, Disp: 90 tablet, Rfl: 0   metFORMIN  (GLUCOPHAGE ) 500 MG tablet, Take 1 tablet (500 mg total) by mouth at bedtime., Disp: 90 tablet, Rfl: 1   methocarbamol  (ROBAXIN ) 500 MG tablet, Take 1 tablet (500 mg total) by mouth every 6 (six) hours as needed for muscle spasms., Disp: 30 tablet, Rfl: 3   blood glucose meter kit and supplies, Dispense based on patient and insurance preference. Test once a daily, Disp: 1 each, Rfl: 0   Blood Glucose Monitoring Suppl DEVI, Test 1-2 times day. Pend on Insurance, Disp: 1 each, Rfl: 0   Glucose Blood (BLOOD GLUCOSE TEST STRIPS) STRP, Test 1-2 times daily, Disp: 100 strip, Rfl: 0   Lancet Device MISC, 1-2 times daily, Disp: 1 each, Rfl: 0   Lancets Misc. MISC, 1-2 times a day, Disp: 100 each, Rfl: 0   naproxen  sodium (ALEVE ) 220 MG tablet, Take 220-440 mg by mouth 2 (two) times daily as needed (pain). (Patient not taking: Reported on 06/12/2024), Disp: , Rfl:   "

## 2024-06-12 NOTE — Progress Notes (Signed)
 Sent information to check out for them to get this when he checked out

## 2024-06-13 ENCOUNTER — Ambulatory Visit: Payer: Self-pay | Admitting: Medical

## 2024-06-13 ENCOUNTER — Other Ambulatory Visit: Payer: Self-pay | Admitting: Medical

## 2024-06-13 LAB — HEPATIC FUNCTION PANEL
ALT: 22 IU/L (ref 0–44)
AST: 19 IU/L (ref 0–40)
Albumin: 4.7 g/dL (ref 4.1–5.1)
Alkaline Phosphatase: 122 IU/L (ref 47–123)
Bilirubin Total: 0.5 mg/dL (ref 0.0–1.2)
Bilirubin, Direct: 0.15 mg/dL (ref 0.00–0.40)
Total Protein: 6.8 g/dL (ref 6.0–8.5)

## 2024-06-13 LAB — URINALYSIS, ROUTINE W REFLEX MICROSCOPIC
Bilirubin, UA: NEGATIVE
Glucose, UA: NEGATIVE
Ketones, UA: NEGATIVE
Leukocytes,UA: NEGATIVE
Nitrite, UA: NEGATIVE
Protein,UA: NEGATIVE
RBC, UA: NEGATIVE
Specific Gravity, UA: 1.009 (ref 1.005–1.030)
Urobilinogen, Ur: 0.2 mg/dL (ref 0.2–1.0)
pH, UA: 6.5 (ref 5.0–7.5)

## 2024-06-13 LAB — LIPID PANEL
Chol/HDL Ratio: 3.4 ratio (ref 0.0–5.0)
Cholesterol, Total: 137 mg/dL (ref 100–199)
HDL: 40 mg/dL
LDL Chol Calc (NIH): 68 mg/dL (ref 0–99)
Triglycerides: 174 mg/dL — ABNORMAL HIGH (ref 0–149)
VLDL Cholesterol Cal: 29 mg/dL (ref 5–40)

## 2024-06-13 LAB — VITAMIN B12: Vitamin B-12: 779 pg/mL (ref 232–1245)

## 2024-06-13 LAB — MICROALBUMIN / CREATININE URINE RATIO
Creatinine, Urine: 33.7 mg/dL
Microalb/Creat Ratio: <9 mg/g{creat} (ref 0–29)
Microalbumin, Urine: <3 ug/mL

## 2024-06-13 MED ORDER — LISINOPRIL 10 MG PO TABS: 10.0000 mg | ORAL_TABLET | Freq: Every day | ORAL | 3 refills | Status: AC

## 2024-06-13 MED ORDER — ATORVASTATIN CALCIUM 40 MG PO TABS: 40.0000 mg | ORAL_TABLET | Freq: Every day | ORAL | 3 refills | Status: AC

## 2024-06-13 NOTE — Progress Notes (Signed)
Patient viewed results on Mychart

## 2024-06-13 NOTE — Progress Notes (Signed)
 Results thru my chart

## 2024-06-14 NOTE — Progress Notes (Signed)
 Results thru my chart

## 2024-06-18 ENCOUNTER — Ambulatory Visit: Payer: Self-pay | Admitting: *Deleted

## 2024-06-18 ENCOUNTER — Ambulatory Visit: Admitting: Medical

## 2024-06-18 VITALS — BP 110/72 | HR 94 | Temp 98.2°F | Wt 166.0 lb

## 2024-06-18 DIAGNOSIS — R6883 Chills (without fever): Secondary | ICD-10-CM | POA: Diagnosis not present

## 2024-06-18 DIAGNOSIS — H6690 Otitis media, unspecified, unspecified ear: Secondary | ICD-10-CM

## 2024-06-18 MED ORDER — AMOXICILLIN-POT CLAVULANATE 875-125 MG PO TABS: 1.0000 | ORAL_TABLET | Freq: Two times a day (BID) | ORAL | 0 refills | Status: DC

## 2024-06-18 NOTE — Telephone Encounter (Signed)
 FYI Only or Action Required?: FYI only for provider: appointment scheduled on 06/19/24.  Patient was last seen in primary care on 06/12/2024 by Bulah Alm RAMAN, PA-C.  Called Nurse Triage reporting Facial Swelling. Left ear pain  Symptoms began several days ago. 12/24 or 25/25  Interventions attempted: OTC medications: tylenol  and Rest, hydration, or home remedies.  Symptoms are: rapidly worsening.  Triage Disposition: See Physician Within 24 Hours  Patient/caregiver understands and will follow disposition?: Yes   Please advise if any available appt today . S/p back surgery 05/10/24. Requesting appt earliest possible due to ear pain              Copied from CRM #[id number]. Topic: Clinical - Red Word Triage >> Jun 18, 2024  8:16 AM Cristian Walker wrote: Pt has been sick since Christmas morning. Says the left side of his face is hurting really bad, he can't hear out of his ear, face is swollen, a lot of ear pain. He says his left temple and neck hurt. Reason for Disposition  Face swelling is painful to touch  Answer Assessment - Initial Assessment Questions Appt scheduled tomorrow with other other provider. None available with PCP until Jan. Patient s/p back surgery Nov. 20. Recommended if sx worsen go to [facility] or ED.        1. ONSET: When did the swelling start? (e.g., minutes, hours, days)     06/14/24 2. LOCATION: What part of the face is swollen? (e.g., cheek, entire face, jaw joint area, under jaw)     Left side of face from back of neck , ear, and temple area  3. SEVERITY: How swollen is it?     Mild swelling sore to touch  4. ITCHING: Is there any itching? If Yes, ask: How much?   (Scale 1-10; mild, moderate or severe)   No  5. PAIN: Is the swelling painful to touch? If Yes, ask: How painful is it?   (Scale 0-10; mild, moderate or severe)     Feels warm and left ear pain , moderate to severe 6. FEVER: Do you have a fever? If Yes, ask: What is it,  how was it measured, and when did it start?      97.4 now  7. CAUSE: What do you think is causing the face swelling?     Not sure  8. NEW MEDICINES: Have there been any new medicines started recently?     No  9. RECURRENT SYMPTOM: Have you had face swelling before? If Yes, ask: When was the last time? What happened that time?     N a 10. OTHER SYMPTOMS: Do you have any other symptoms? (e.g., leg swelling, toothache)       Left side face swelling, left ear pain can't hear out of ear. No drainage,  chest pain , no difficulty breathing , no fever, recent back surgery Nov. 20. Reports equilibrium off at times.  11. PREGNANCY: Is there any chance you are pregnant? When was your last menstrual period?       na  Protocols used: Face Swelling-A-AH

## 2024-06-18 NOTE — Progress Notes (Signed)
 "  Name: Cristian Walker   Date of Visit: 06/18/2024   Date of last visit with me: 06/12/2024   CHIEF COMPLAINT:  Chief Complaint  Patient presents with   Acute Visit    Christmas Eve, Chills, fever 2 days, Facial swelling, severe ear pain, no energy, HA, alittle cough. Started taking left over doxcycline the day after christmas but hasn't helped the ear pain       HPI:  Discussed the use of AI scribe software for clinical note transcription with the patient, who gave verbal consent to proceed.  History of Present Illness  Cristian Walker is a 43 year old male who presents with chills, fever, and ear pain.  He woke up on Christmas Eve at 1:00 AM with severe chills and shaking lasting three to four minutes. Similar chills occurred at 4:00 AM, with a recorded temperature of 100.17F. He took Tylenol  for relief. Over the following days, he experienced significant body aches, fatigue, and slept most of Christmas Eve and Christmas Day, requiring frequent rest. He describes these symptoms as feeling like the 'super flu'.  Later on Christmas Eve, he developed pain in his face, temple, and ears, with the left ear feeling stopped up and painful, described as a throbbing sensation. He reports swelling on the left side of his face. No teeth pain is reported. He started taking leftover doxycycline the day after Christmas, with no improvement in symptoms after seven doses. He also took Tylenol  and Sudafed for symptom relief.  His wife has had a cold with ear popping but no fever. He has a five-year-old child in preschool, which he notes as a potential source of illness. He mentions that his symptoms worsen at night, especially when his child returns home from school.  He occasionally takes hydrocodone  for back pain, which he tried for ear pain but found it only provided relief for about two hours.  No other aggravating or relieving factors. No other complaint.    Past Medical History:   Diagnosis Date   Diabetes mellitus without complication (HCC) 2023   HNP (herniated nucleus pulposus), cervical    C4-5   Hyperlipidemia    Hypertension    Spontaneous pneumothorax    3 times, 2009, 2007, 2005   Medications Ordered Prior to Encounter[1]  ROS as in subjective    Objective: BP 110/72   Pulse 94   Temp 98.2 F (36.8 C)   Wt 166 lb (75.3 kg)   SpO2 96%   BMI 26.79 kg/m   General appearence: alert, no distress, WD/WN, no obvious facial swelling today HEENT: normocephalic, sclerae anicteric, left TM with erythema and air fluid levels, right TM normal,  nares patent, no discharge or erythema, pharynx normal Oral cavity: MMM, no lesions Neck: supple, no lymphadenopathy, no thyromegaly, no masses Heart: RRR, normal S1, S2, no murmurs Lungs: CTA bilaterally, no wheezes, rhonchi, or rales     Assessment: Encounter Diagnoses  Name Primary?   Acute otitis media, unspecified otitis media type Yes   Chills      Plan: Acute otitis media, left ear Acute otitis media with fluid and redness in left ear. Likely bacterial due to persistent symptoms and anatomical findings. Previous doxycycline ineffective. - Prescribed Augmentin  for 7-10 days, option to stop after 5-7 days if symptoms resolve. - Advised discontinuation of doxycycline. - Recommended nasal saline flush for sinus and ear decongestion. - Encouraged increased water intake.  Acute upper respiratory infection with sinus congestion Acute upper respiratory infection  with sinus congestion, likely viral with possible bacterial component due to ear involvement. - Recommended he can continue Sudafed, max 60 mg twice daily, for congestion for a few more days - Suggested Afrin nasal spray for up to 3 days for nasal congestion relief. - Advised Flonase  nasal spray to open nasal passages. - Encouraged increased water intake.   Cristian Walker was seen today for acute visit.  Diagnoses and all orders for this  visit:  Acute otitis media, unspecified otitis media type  Chills  Other orders -     amoxicillin -clavulanate (AUGMENTIN ) 875-125 MG tablet; Take 1 tablet by mouth 2 (two) times daily.    F/u prn if not improving in 5-7 days          [1]  Current Outpatient Medications on File Prior to Visit  Medication Sig Dispense Refill   acetaminophen  (TYLENOL ) 500 MG tablet Take 500-1,000 mg by mouth every 6 (six) hours as needed (Pain but not taking in addition to post pain medications with Tylenol  in them per patient.).     atorvastatin  (LIPITOR) 40 MG tablet Take 1 tablet (40 mg total) by mouth daily. 90 tablet 3   gabapentin  (NEURONTIN ) 300 MG capsule Take 300 mg by mouth in the morning and at bedtime. (Patient taking differently: Take 300 mg by mouth daily.)     HYDROcodone -acetaminophen  (NORCO/VICODIN) 5-325 MG tablet      lisinopril  (ZESTRIL ) 10 MG tablet Take 1 tablet (10 mg total) by mouth daily. 90 tablet 3   metFORMIN  (GLUCOPHAGE ) 500 MG tablet Take 1 tablet (500 mg total) by mouth at bedtime. 90 tablet 1   methocarbamol  (ROBAXIN ) 500 MG tablet Take 1 tablet (500 mg total) by mouth every 6 (six) hours as needed for muscle spasms. 30 tablet 3   naproxen  sodium (ALEVE ) 220 MG tablet Take 220-440 mg by mouth 2 (two) times daily as needed (pain).     blood glucose meter kit and supplies Dispense based on patient and insurance preference. Test once a daily 1 each 0   Blood Glucose Monitoring Suppl DEVI Test 1-2 times day. Pend on Insurance 1 each 0   Glucose Blood (BLOOD GLUCOSE TEST STRIPS) STRP Test 1-2 times daily 100 strip 0   Lancet Device MISC 1-2 times daily 1 each 0   Lancets Misc. MISC 1-2 times a day 100 each 0   No current facility-administered medications on file prior to visit.   "

## 2024-06-19 ENCOUNTER — Other Ambulatory Visit: Payer: Self-pay | Admitting: Medical

## 2024-06-19 ENCOUNTER — Ambulatory Visit: Admitting: Family Medicine

## 2024-06-19 MED ORDER — OFLOXACIN 0.3 % OT SOLN: 5.0000 [drp] | Freq: Two times a day (BID) | OTIC | 0 refills | Status: DC

## 2024-06-27 ENCOUNTER — Ambulatory Visit: Payer: Self-pay

## 2024-06-27 NOTE — Telephone Encounter (Signed)
 Called patient and scheduled him with Ludie tomorrow @ 2:00

## 2024-06-27 NOTE — Telephone Encounter (Signed)
 FYI Only or Action Required?: Action required by provider: update on patient condition.  Patient was last seen in primary care on 06/18/2024 by Bulah Alm RAMAN, PA-C.  Called Nurse Triage reporting Otitis Media.  Symptoms began 2 weeks ago.  Interventions attempted: Prescription medications: Floxin ; Augmentin .  Symptoms are: ear pain is improved but states other symptoms feel the same.  Triage Disposition: Call PCP Now  Patient/caregiver understands and will follow disposition?: Yes              Copied from CRM #[id number]. Topic: Clinical - Red Word Triage >> Jun 27, 2024  1:39 PM Nessti S wrote: [facility] that prompted transfer to Nurse Triage: still has left ear infection Answer Assessment - Initial Assessment Questions Ear pain has improved, no fever or discharge. No disposition. Routing note to provider to review to advise if further treatment is needed or if follow up appt is needed.  1. ANTIBIOTIC: What antibiotic are you taking? How many times per day?     Augmentin  bid x 10 days. Floxin  5 drops bid. He states he is taking Flonase  and Sudafed as well.  2. ONSET: When was the antibiotic started?     06/18/24.  3. LOCATION: Which ear is involved?     Left ear.  4. PAIN: How bad is the pain?   (Scale 0-10; none, mild, moderate or severe)     Very little, not like it was in the beginning. Not throbbing like it was.  5. FEVER: Do you have a fever? If Yes, ask: What is your temperature, how was it measured, and when did it start?     No.  6. DISCHARGE: Is there any discharge? If Yes, ask: What color is it? (e.g., clear, white; yellow, green; bloody)     No.  7. OTHER SYMPTOMS: Do you have any other symptoms? (e.g., headache, stiff neck, dizziness, vomiting, runny nose)     Muffled/decreased hearing in left ear feels like talking in a tunnel, runny nose, headache around left temple. No fever, neck stiffness, nausea, vomiting, dizziness or off  balance.  Protocols used: Ear - Otitis Media Follow-up Call-A-AH

## 2024-06-28 ENCOUNTER — Ambulatory Visit (INDEPENDENT_AMBULATORY_CARE_PROVIDER_SITE_OTHER): Admitting: Medical

## 2024-06-28 VITALS — BP 120/70 | HR 102 | Temp 98.6°F | Wt 165.6 lb

## 2024-06-28 DIAGNOSIS — R6889 Other general symptoms and signs: Secondary | ICD-10-CM | POA: Diagnosis not present

## 2024-06-28 DIAGNOSIS — F1193 Opioid use, unspecified with withdrawal: Secondary | ICD-10-CM

## 2024-06-28 DIAGNOSIS — H938X2 Other specified disorders of left ear: Secondary | ICD-10-CM | POA: Diagnosis not present

## 2024-06-28 MED ORDER — CLONIDINE HCL 0.1 MG PO TABS: 0.1000 mg | ORAL_TABLET | Freq: Two times a day (BID) | ORAL | 0 refills | Status: DC

## 2024-06-28 MED ORDER — CIPROFLOXACIN-HYDROCORTISONE 0.2-1 % OT SUSP: 3.0000 [drp] | Freq: Two times a day (BID) | OTIC | 0 refills | Status: DC

## 2024-06-28 MED ORDER — PREDNISONE 20 MG PO TABS: 20.0000 mg | ORAL_TABLET | Freq: Every day | ORAL | 0 refills | Status: DC

## 2024-06-28 NOTE — Progress Notes (Signed)
 "  Name: Cristian Walker   Date of Visit: 06/28/2024   Date of last visit with me: 06/18/2024   CHIEF COMPLAINT:  Chief Complaint  Patient presents with   Follow-up    Follow-up on ear pain. Still no better. Still having pain.        HPI:  Discussed the use of AI scribe software for clinical note transcription with the patient, who gave verbal consent to proceed.  History of Present Illness   Cristian Walker is a 44 year old male who presents with persistent left ear pain.  I saw him for this on 06/18/2024 and prescribed treatment at that time.  He has been experiencing persistent left ear pain since June 14, 2024, which initially started after feeling unwell on Christmas Eve. The pain has improved significantly but he still experiences a sensation of 'talking in a tunnel' and a 'big echo' when attempting to equalize pressure in his ears. He completed a course of Augmentin  this morning. He had used Afrin for three days without relief and has been using Flonase  for an extended period. He had taken Sudafed once daily in the morning, which is a 12-hour formulation, but does not feel congested. The sensation is described as sore and irritating.  He has a history of back problems and has been on and off opioid medications. He recently stopped taking hydrocodone , with his last dose on Tuesday 2 days ago. Since then, he has experienced withdrawal symptoms, including feeling jittery, nervous, and unable to focus. He reports a supportive home life but feels 'lost' and 'dazing off.' He is not interested in treatments like Suboxone or methadone and is seeking ways to manage withdrawal symptoms without continuing opioid use.  He mentions a past follow-up with his neurosurgeon on May 31, 2024, where he was prescribed a six-day course of steroids to manage inflammation. He is currently not taking metformin  and is monitoring his diabetes management through weight control and exercise.  No  other aggravating or relieving factors. No other complaint.    Past Medical History:  Diagnosis Date   Diabetes mellitus without complication (HCC) 2023   HNP (herniated nucleus pulposus), cervical    C4-5   Hyperlipidemia    Hypertension    Spontaneous pneumothorax    3 times, 2009, 2007, 2005   Medications Ordered Prior to Encounter[1]  ROS as in subjective    Objective: BP 120/70   Pulse (!) 102   Temp 98.6 F (37 C)   Wt 165 lb 9.6 oz (75.1 kg)   SpO2 98%   BMI 26.73 kg/m   General appearence: alert, no distress, WD/WN HEENT: normocephalic, sclerae anicteric, bilateral TMs normal, no erythema, no air-fluid levels, nares patent, no discharge or erythema, pharynx normal Oral cavity: MMM, no lesions Neck: supple, no lymphadenopathy, no thyromegaly, no masses Heart: RRR, normal S1, S2, no murmurs Lungs: CTA bilaterally, no wheezes, rhonchi, or rales Neuro: CN II through XII intact, nonfocal exam Psych: Pleasant, answers questions appropriately, calm    Assessment: Encounter Diagnoses  Name Primary?   Ear pressure, left Yes   Withdrawal complaint    Opiate withdrawal (HCC)       Plan: Ear infection mostly resolved No additional oral antibiotics needed at this time  Recommendations: -begin Prednisone  oral tablet 20mg  daily for 3 days -begin another 2-3 days of Afrin nasal spray for up to 3 days for nasal congestion relief. -until this resolved, continue Flonase  nasal spray to open nasal passages. - Encouraged  increased water intake, swallowing, along with nasal squeeze to pressurize ears periodically the next days -optional - you can use Cipro  HC ear drop with steroid (new) for the next 5-7 days if reasonably priced as an alternate to oral prednisone    Withdrawal symptoms from recent opioids (last dose 2 days ago) with recent tapering down on medication as he has not wanted to be on opiates but had recent surgery Begin Clonidine  0.1mg , 1 tablet tonight  (Thursday) Take Clonidine  0.1mg  twice daily the next 3 days Then on Monday, take Clonidine  0.1mg , 1/2 tablet twice daily for 2 days, then discontinue Stop Lisinopril  until Monday to avoid hypotension  If not seeing significant improvement in the next 48 hours then let me know   Isaia was seen today for follow-up.  Diagnoses and all orders for this visit:  Ear pressure, left  Withdrawal complaint  Opiate withdrawal (HCC)  Other orders -     predniSONE  (DELTASONE ) 20 MG tablet; Take 1 tablet (20 mg total) by mouth daily with breakfast. -     ciprofloxacin -hydrocortisone  (CIPRO  HC) OTIC suspension; Place 3 drops into the left ear 2 (two) times daily. -     cloNIDine  (CATAPRES ) 0.1 MG tablet; Take 1 tablet (0.1 mg total) by mouth 2 (two) times daily.     F/u prn if not improving in 5-7 days          [1]  Current Outpatient Medications on File Prior to Visit  Medication Sig Dispense Refill   acetaminophen  (TYLENOL ) 500 MG tablet Take 500-1,000 mg by mouth every 6 (six) hours as needed (Pain but not taking in addition to post pain medications with Tylenol  in them per patient.).     atorvastatin  (LIPITOR) 40 MG tablet Take 1 tablet (40 mg total) by mouth daily. 90 tablet 3   blood glucose meter kit and supplies Dispense based on patient and insurance preference. Test once a daily 1 each 0   Blood Glucose Monitoring Suppl DEVI Test 1-2 times day. Pend on Insurance 1 each 0   Glucose Blood (BLOOD GLUCOSE TEST STRIPS) STRP Test 1-2 times daily 100 strip 0   lisinopril  (ZESTRIL ) 10 MG tablet Take 1 tablet (10 mg total) by mouth daily. 90 tablet 3   metFORMIN  (GLUCOPHAGE ) 500 MG tablet Take 1 tablet (500 mg total) by mouth at bedtime. 90 tablet 1   methocarbamol  (ROBAXIN ) 500 MG tablet Take 1 tablet (500 mg total) by mouth every 6 (six) hours as needed for muscle spasms. 30 tablet 3   naproxen  sodium (ALEVE ) 220 MG tablet Take 220-440 mg by mouth 2 (two) times daily as needed  (pain).     Lancet Device MISC 1-2 times daily 1 each 0   Lancets Misc. MISC 1-2 times a day 100 each 0   No current facility-administered medications on file prior to visit.   "

## 2024-06-28 NOTE — Patient Instructions (Signed)
 Ear infection mostly resolved No additional oral antibiotics needed at this time  Recommendations: -begin Prednisone  oral tablet 20mg  daily for 3 days -begin another 2-3 days of Afrin nasal spray for up to 3 days for nasal congestion relief. -until this resolved, continue Flonase  nasal spray to open nasal passages. - Encouraged increased water intake, swallowing, along with nasal squeeze to pressurize ears periodically the next days -optional - you can use Cipro  HC ear drop with steroid (new) for the next 5-7 days if reasonably priced as an alternate to oral prednisone    Withdrawal symptoms from recent opioids (last dose 2 days ago) Begin Clonidine  0.1mg , 1 tablet tonight (Thursday) Take Clonidine  0.1mg  twice daily the next 3 days Then on Monday, take Clonidine  0.1mg , 1/2 tablet twice daily for 2 days, then discontinue Stop Lisinopril  until Monday

## 2024-07-03 ENCOUNTER — Other Ambulatory Visit: Payer: Self-pay | Admitting: Medical

## 2024-07-03 MED ORDER — VENLAFAXINE HCL ER 37.5 MG PO CP24: 37.5000 mg | ORAL_CAPSULE | Freq: Every day | ORAL | 0 refills | Status: DC

## 2024-07-08 ENCOUNTER — Other Ambulatory Visit: Payer: Self-pay | Admitting: Medical

## 2024-07-25 ENCOUNTER — Other Ambulatory Visit: Payer: Self-pay | Admitting: Medical

## 2024-12-10 ENCOUNTER — Ambulatory Visit: Admitting: Medical
